# Patient Record
Sex: Female | Born: 1935
Health system: Southern US, Community
[De-identification: ages and names within clinical notes are randomized; demographics above are authoritative.]

## PROBLEM LIST (undated history)

## (undated) DIAGNOSIS — I10 Essential (primary) hypertension: Secondary | ICD-10-CM

## (undated) DIAGNOSIS — M1990 Unspecified osteoarthritis, unspecified site: Secondary | ICD-10-CM

## (undated) DIAGNOSIS — R011 Cardiac murmur, unspecified: Secondary | ICD-10-CM

## (undated) DIAGNOSIS — I480 Paroxysmal atrial fibrillation: Secondary | ICD-10-CM

## (undated) DIAGNOSIS — R001 Bradycardia, unspecified: Secondary | ICD-10-CM

## (undated) DIAGNOSIS — E039 Hypothyroidism, unspecified: Secondary | ICD-10-CM

## (undated) DIAGNOSIS — E785 Hyperlipidemia, unspecified: Secondary | ICD-10-CM

## (undated) DIAGNOSIS — Z9889 Other specified postprocedural states: Secondary | ICD-10-CM

## (undated) DIAGNOSIS — I639 Cerebral infarction, unspecified: Secondary | ICD-10-CM

## (undated) DIAGNOSIS — F419 Anxiety disorder, unspecified: Secondary | ICD-10-CM

## (undated) DIAGNOSIS — Z973 Presence of spectacles and contact lenses: Secondary | ICD-10-CM

## (undated) DIAGNOSIS — I5032 Chronic diastolic (congestive) heart failure: Secondary | ICD-10-CM

## (undated) DIAGNOSIS — R112 Nausea with vomiting, unspecified: Secondary | ICD-10-CM

## (undated) DIAGNOSIS — C50919 Malignant neoplasm of unspecified site of unspecified female breast: Secondary | ICD-10-CM

## (undated) HISTORY — DX: Hyperlipidemia, unspecified: E78.5

## (undated) HISTORY — DX: Cerebral infarction, unspecified: I63.9

## (undated) HISTORY — DX: Cardiac murmur, unspecified: R01.1

## (undated) HISTORY — PX: FRACTURE SURGERY: SHX138

## (undated) HISTORY — PX: EYE SURGERY: SHX253

## (undated) HISTORY — PX: BREAST SURGERY: SHX581

## (undated) HISTORY — PX: ABDOMINAL HYSTERECTOMY: SHX81

## (undated) HISTORY — PX: COLONOSCOPY: SHX174

---

## 2012-03-24 ENCOUNTER — Encounter (HOSPITAL_BASED_OUTPATIENT_CLINIC_OR_DEPARTMENT_OTHER): Payer: Self-pay | Admitting: *Deleted

## 2012-03-24 NOTE — Progress Notes (Signed)
Had ekg and labs with dr gage-will call for notes- Diabetic-bs 175-300 range this week Bring all meds and overnight bag Denies any cardiac problems Denies copd-smoked for many yr

## 2012-03-25 ENCOUNTER — Encounter (HOSPITAL_COMMUNITY): Payer: Self-pay | Admitting: *Deleted

## 2012-03-25 MED ORDER — CEFAZOLIN SODIUM-DEXTROSE 2-3 GM-% IV SOLR
2.0000 g | INTRAVENOUS | Status: AC
  Administered 2012-03-26: 2 g via INTRAVENOUS
  Filled 2012-03-25: qty 50

## 2012-03-26 ENCOUNTER — Encounter (HOSPITAL_COMMUNITY)
Admission: RE | Disposition: A | Discharge status: Skilled Nursing Facility | Payer: Self-pay | Source: Ambulatory Visit | Attending: Orthopedic Surgery

## 2012-03-26 ENCOUNTER — Ambulatory Visit (HOSPITAL_COMMUNITY): Payer: Medicare Other

## 2012-03-26 ENCOUNTER — Encounter (HOSPITAL_COMMUNITY): Payer: Self-pay | Admitting: Certified Registered Nurse Anesthetist

## 2012-03-26 ENCOUNTER — Ambulatory Visit (HOSPITAL_BASED_OUTPATIENT_CLINIC_OR_DEPARTMENT_OTHER): Admission: RE | Admit: 2012-03-26 | Payer: Medicare Other | Source: Ambulatory Visit | Admitting: Orthopedic Surgery

## 2012-03-26 ENCOUNTER — Inpatient Hospital Stay (HOSPITAL_COMMUNITY)
Admission: RE | Admit: 2012-03-26 | Discharge: 2012-03-30 | DRG: 494 | Disposition: A | Discharge status: Skilled Nursing Facility | Payer: Medicare Other | Source: Ambulatory Visit | Attending: Orthopedic Surgery | Admitting: Orthopedic Surgery

## 2012-03-26 ENCOUNTER — Encounter (HOSPITAL_BASED_OUTPATIENT_CLINIC_OR_DEPARTMENT_OTHER): Admission: RE | Payer: Self-pay | Source: Ambulatory Visit

## 2012-03-26 ENCOUNTER — Ambulatory Visit (HOSPITAL_COMMUNITY): Payer: Medicare Other | Admitting: Certified Registered Nurse Anesthetist

## 2012-03-26 DIAGNOSIS — Z7982 Long term (current) use of aspirin: Secondary | ICD-10-CM

## 2012-03-26 DIAGNOSIS — S82851A Displaced trimalleolar fracture of right lower leg, initial encounter for closed fracture: Secondary | ICD-10-CM

## 2012-03-26 DIAGNOSIS — X58XXXA Exposure to other specified factors, initial encounter: Secondary | ICD-10-CM | POA: Diagnosis present

## 2012-03-26 DIAGNOSIS — E119 Type 2 diabetes mellitus without complications: Secondary | ICD-10-CM | POA: Diagnosis present

## 2012-03-26 DIAGNOSIS — I10 Essential (primary) hypertension: Secondary | ICD-10-CM | POA: Diagnosis present

## 2012-03-26 DIAGNOSIS — Z794 Long term (current) use of insulin: Secondary | ICD-10-CM

## 2012-03-26 DIAGNOSIS — S82853A Displaced trimalleolar fracture of unspecified lower leg, initial encounter for closed fracture: Principal | ICD-10-CM | POA: Diagnosis present

## 2012-03-26 DIAGNOSIS — Z87891 Personal history of nicotine dependence: Secondary | ICD-10-CM

## 2012-03-26 HISTORY — DX: Other specified postprocedural states: Z98.890

## 2012-03-26 HISTORY — DX: Anxiety disorder, unspecified: F41.9

## 2012-03-26 HISTORY — DX: Hypothyroidism, unspecified: E03.9

## 2012-03-26 HISTORY — PX: ORIF ANKLE FRACTURE: SHX5408

## 2012-03-26 HISTORY — DX: Essential (primary) hypertension: I10

## 2012-03-26 HISTORY — DX: Unspecified osteoarthritis, unspecified site: M19.90

## 2012-03-26 HISTORY — DX: Presence of spectacles and contact lenses: Z97.3

## 2012-03-26 HISTORY — DX: Nausea with vomiting, unspecified: R11.2

## 2012-03-26 LAB — CBC
HCT: 41.7 % (ref 36.0–46.0)
MCHC: 34.8 g/dL (ref 30.0–36.0)
RDW: 12.9 % (ref 11.5–15.5)
WBC: 10.6 10*3/uL — ABNORMAL HIGH (ref 4.0–10.5)

## 2012-03-26 LAB — BASIC METABOLIC PANEL
Chloride: 98 mEq/L (ref 96–112)
Creatinine, Ser: 0.76 mg/dL (ref 0.50–1.10)
GFR calc Af Amer: >90 mL/min (ref >90)
GFR calc non Af Amer: 79 mL/min — ABNORMAL LOW (ref >90)
Potassium: 5.1 mEq/L (ref 3.5–5.1)

## 2012-03-26 LAB — GLUCOSE, CAPILLARY
Glucose-Capillary: 333 mg/dL — ABNORMAL HIGH (ref 70–99)
Glucose-Capillary: 222 mg/dL — ABNORMAL HIGH (ref 70–99)

## 2012-03-26 LAB — SURGICAL PCR SCREEN
MRSA, PCR: NEGATIVE
Staphylococcus aureus: NEGATIVE

## 2012-03-26 SURGERY — OPEN REDUCTION INTERNAL FIXATION (ORIF) ANKLE FRACTURE
Anesthesia: General | Site: Ankle | Laterality: Right | Wound class: Clean

## 2012-03-26 SURGERY — ANKLE ARTHROSCOPY WITH OPEN REDUCTION INTERNAL FIXATION (ORIF)
Anesthesia: General | Laterality: Right

## 2012-03-26 MED ORDER — ALPRAZOLAM 0.25 MG PO TABS
0.2500 mg | ORAL_TABLET | Freq: Every evening | ORAL | Status: DC | PRN
  Administered 2012-03-26 – 2012-03-29 (×3): 0.25 mg via ORAL
  Filled 2012-03-26 (×3): qty 1

## 2012-03-26 MED ORDER — HYDROCHLOROTHIAZIDE 25 MG PO TABS
12.5000 mg | ORAL_TABLET | Freq: Every day | ORAL | Status: DC
  Filled 2012-03-26: qty 0.5

## 2012-03-26 MED ORDER — ONDANSETRON HCL 4 MG PO TABS: 4.0000 mg | ORAL_TABLET | Freq: Four times a day (QID) | ORAL | Status: DC | PRN

## 2012-03-26 MED ORDER — INSULIN ASPART 100 UNIT/ML ~~LOC~~ SOLN
0.0000 [IU] | Freq: Three times a day (TID) | SUBCUTANEOUS | Status: DC
  Administered 2012-03-26: 11 [IU] via SUBCUTANEOUS
  Administered 2012-03-26 – 2012-03-27 (×2): 8 [IU] via SUBCUTANEOUS
  Administered 2012-03-27 (×2): 3 [IU] via SUBCUTANEOUS
  Administered 2012-03-28 – 2012-03-29 (×2): 8 [IU] via SUBCUTANEOUS
  Administered 2012-03-29: 15 [IU] via SUBCUTANEOUS
  Administered 2012-03-30: 5 [IU] via SUBCUTANEOUS

## 2012-03-26 MED ORDER — HYDROMORPHONE HCL PF 1 MG/ML IJ SOLN: 0.2500 mg | INTRAMUSCULAR | Status: DC | PRN

## 2012-03-26 MED ORDER — HYDROCHLOROTHIAZIDE 12.5 MG PO CAPS
12.5000 mg | ORAL_CAPSULE | Freq: Every day | ORAL | Status: DC
  Administered 2012-03-26 – 2012-03-30 (×5): 12.5 mg via ORAL
  Filled 2012-03-26 (×5): qty 1

## 2012-03-26 MED ORDER — LIDOCAINE-EPINEPHRINE (PF) 1.5 %-1:200000 IJ SOLN
INTRAMUSCULAR | Status: DC | PRN
  Administered 2012-03-26: 30 mL

## 2012-03-26 MED ORDER — MIDAZOLAM HCL 2 MG/2ML IJ SOLN
1.0000 mg | INTRAMUSCULAR | Status: DC | PRN
  Administered 2012-03-26: 2 mg via INTRAVENOUS

## 2012-03-26 MED ORDER — CITALOPRAM HYDROBROMIDE 20 MG PO TABS
20.0000 mg | ORAL_TABLET | Freq: Every day | ORAL | Status: DC
  Administered 2012-03-27 – 2012-03-30 (×4): 20 mg via ORAL
  Filled 2012-03-26 (×4): qty 1

## 2012-03-26 MED ORDER — ASPIRIN EC 325 MG PO TBEC
325.0000 mg | DELAYED_RELEASE_TABLET | Freq: Two times a day (BID) | ORAL | Status: DC
  Administered 2012-03-26 – 2012-03-30 (×9): 325 mg via ORAL
  Filled 2012-03-26 (×10): qty 1

## 2012-03-26 MED ORDER — MIDAZOLAM HCL 5 MG/5ML IJ SOLN
INTRAMUSCULAR | Status: DC | PRN
  Administered 2012-03-26: 2 mg via INTRAVENOUS

## 2012-03-26 MED ORDER — SENNA 8.6 MG PO TABS
2.0000 | ORAL_TABLET | Freq: Two times a day (BID) | ORAL | Status: DC
  Administered 2012-03-26 – 2012-03-30 (×6): 17.2 mg via ORAL
  Filled 2012-03-26 (×3): qty 2
  Filled 2012-03-26: qty 1
  Filled 2012-03-26 (×6): qty 2

## 2012-03-26 MED ORDER — ATORVASTATIN CALCIUM 40 MG PO TABS
40.0000 mg | ORAL_TABLET | Freq: Every day | ORAL | Status: DC
  Administered 2012-03-26 – 2012-03-28 (×3): 40 mg via ORAL
  Filled 2012-03-26 (×5): qty 1

## 2012-03-26 MED ORDER — BACITRACIN ZINC 500 UNIT/GM EX OINT
TOPICAL_OINTMENT | CUTANEOUS | Status: DC | PRN
  Administered 2012-03-26: 1 via TOPICAL

## 2012-03-26 MED ORDER — FENTANYL CITRATE 0.05 MG/ML IJ SOLN
INTRAMUSCULAR | Status: AC
  Filled 2012-03-26: qty 2

## 2012-03-26 MED ORDER — FENTANYL CITRATE 0.05 MG/ML IJ SOLN
50.0000 ug | INTRAMUSCULAR | Status: DC | PRN
  Administered 2012-03-26: 50 ug via INTRAVENOUS

## 2012-03-26 MED ORDER — LEVOTHYROXINE SODIUM 50 MCG PO TABS
50.0000 ug | ORAL_TABLET | Freq: Every day | ORAL | Status: DC
  Administered 2012-03-27 – 2012-03-30 (×5): 50 ug via ORAL
  Filled 2012-03-26 (×5): qty 1

## 2012-03-26 MED ORDER — LACTATED RINGERS IV SOLN
INTRAVENOUS | Status: DC | PRN
  Administered 2012-03-26 (×2): via INTRAVENOUS

## 2012-03-26 MED ORDER — IRBESARTAN 300 MG PO TABS
300.0000 mg | ORAL_TABLET | Freq: Every day | ORAL | Status: DC
  Administered 2012-03-26 – 2012-03-30 (×5): 300 mg via ORAL
  Filled 2012-03-26 (×5): qty 1

## 2012-03-26 MED ORDER — FENTANYL CITRATE 0.05 MG/ML IJ SOLN
INTRAMUSCULAR | Status: DC | PRN
  Administered 2012-03-26: 50 ug via INTRAVENOUS

## 2012-03-26 MED ORDER — BUPIVACAINE-EPINEPHRINE PF 0.5-1:200000 % IJ SOLN
INTRAMUSCULAR | Status: DC | PRN
  Administered 2012-03-26: 30 mL

## 2012-03-26 MED ORDER — ONDANSETRON HCL 4 MG/2ML IJ SOLN: 4.0000 mg | Freq: Once | INTRAMUSCULAR | Status: DC | PRN

## 2012-03-26 MED ORDER — POTASSIUM CHLORIDE CRYS ER 20 MEQ PO TBCR
20.0000 meq | EXTENDED_RELEASE_TABLET | Freq: Every day | ORAL | Status: DC
  Administered 2012-03-26 – 2012-03-30 (×5): 20 meq via ORAL
  Filled 2012-03-26 (×5): qty 1

## 2012-03-26 MED ORDER — PHENYLEPHRINE HCL 10 MG/ML IJ SOLN
INTRAMUSCULAR | Status: DC | PRN
  Administered 2012-03-26: 80 ug via INTRAVENOUS
  Administered 2012-03-26: 40 ug via INTRAVENOUS
  Administered 2012-03-26: 80 ug via INTRAVENOUS

## 2012-03-26 MED ORDER — OXYCODONE HCL 5 MG PO TABS
5.0000 mg | ORAL_TABLET | ORAL | Status: DC | PRN
  Administered 2012-03-26 – 2012-03-29 (×10): 5 mg via ORAL
  Filled 2012-03-26 (×10): qty 1

## 2012-03-26 MED ORDER — AMLODIPINE-OLMESARTAN 10-40 MG PO TABS: 1.0000 | ORAL_TABLET | Freq: Every day | ORAL | Status: DC

## 2012-03-26 MED ORDER — VITAMIN B-12 1000 MCG PO TABS
1000.0000 ug | ORAL_TABLET | Freq: Every day | ORAL | Status: DC
  Administered 2012-03-26 – 2012-03-30 (×5): 1000 ug via ORAL
  Filled 2012-03-26 (×5): qty 1

## 2012-03-26 MED ORDER — AMLODIPINE BESYLATE 10 MG PO TABS
10.0000 mg | ORAL_TABLET | Freq: Every day | ORAL | Status: DC
  Administered 2012-03-26 – 2012-03-30 (×5): 10 mg via ORAL
  Filled 2012-03-26 (×5): qty 1

## 2012-03-26 MED ORDER — METOCLOPRAMIDE HCL 5 MG/ML IJ SOLN
5.0000 mg | Freq: Three times a day (TID) | INTRAMUSCULAR | Status: DC | PRN
  Filled 2012-03-26: qty 2

## 2012-03-26 MED ORDER — METOPROLOL SUCCINATE ER 25 MG PO TB24
25.0000 mg | ORAL_TABLET | Freq: Every day | ORAL | Status: DC
  Administered 2012-03-27 – 2012-03-30 (×4): 25 mg via ORAL
  Filled 2012-03-26 (×4): qty 1

## 2012-03-26 MED ORDER — METOCLOPRAMIDE HCL 10 MG PO TABS: 5.0000 mg | ORAL_TABLET | Freq: Three times a day (TID) | ORAL | Status: DC | PRN

## 2012-03-26 MED ORDER — CHLORHEXIDINE GLUCONATE 4 % EX LIQD: 60.0000 mL | Freq: Once | CUTANEOUS | Status: DC

## 2012-03-26 MED ORDER — BACITRACIN ZINC 500 UNIT/GM EX OINT
TOPICAL_OINTMENT | CUTANEOUS | Status: AC
  Filled 2012-03-26: qty 15

## 2012-03-26 MED ORDER — ONDANSETRON HCL 4 MG/2ML IJ SOLN: 4.0000 mg | Freq: Four times a day (QID) | INTRAMUSCULAR | Status: DC | PRN

## 2012-03-26 MED ORDER — EPHEDRINE SULFATE 50 MG/ML IJ SOLN
INTRAMUSCULAR | Status: DC | PRN
  Administered 2012-03-26 (×2): 10 mg via INTRAVENOUS
  Administered 2012-03-26: 5 mg via INTRAVENOUS

## 2012-03-26 MED ORDER — SODIUM CHLORIDE 0.9 % IV SOLN: INTRAVENOUS | Status: DC

## 2012-03-26 MED ORDER — MUPIROCIN 2 % EX OINT
TOPICAL_OINTMENT | CUTANEOUS | Status: AC
  Administered 2012-03-26: 1 via NASAL
  Filled 2012-03-26: qty 22

## 2012-03-26 MED ORDER — DOCUSATE SODIUM 100 MG PO CAPS
100.0000 mg | ORAL_CAPSULE | Freq: Two times a day (BID) | ORAL | Status: DC
  Administered 2012-03-26 – 2012-03-30 (×5): 100 mg via ORAL
  Filled 2012-03-26 (×5): qty 1

## 2012-03-26 MED ORDER — PROPOFOL 10 MG/ML IV EMUL
INTRAVENOUS | Status: DC | PRN
  Administered 2012-03-26: 150 mg via INTRAVENOUS

## 2012-03-26 MED ORDER — ONDANSETRON HCL 4 MG/2ML IJ SOLN
INTRAMUSCULAR | Status: DC | PRN
  Administered 2012-03-26: 4 mg via INTRAVENOUS

## 2012-03-26 MED ORDER — INSULIN DETEMIR 100 UNIT/ML ~~LOC~~ SOLN
20.0000 [IU] | Freq: Every day | SUBCUTANEOUS | Status: DC
  Administered 2012-03-26 – 2012-03-29 (×4): 20 [IU] via SUBCUTANEOUS
  Filled 2012-03-26 (×2): qty 10

## 2012-03-26 MED ORDER — SODIUM CHLORIDE 0.9 % IV SOLN
INTRAVENOUS | Status: DC
  Administered 2012-03-26: 16:00:00 via INTRAVENOUS

## 2012-03-26 MED ORDER — MORPHINE SULFATE 2 MG/ML IJ SOLN
1.0000 mg | INTRAMUSCULAR | Status: DC | PRN
  Administered 2012-03-26: 1 mg via INTRAVENOUS
  Filled 2012-03-26: qty 1

## 2012-03-26 MED ORDER — INSULIN ASPART 100 UNIT/ML ~~LOC~~ SOLN
SUBCUTANEOUS | Status: AC
  Filled 2012-03-26: qty 1

## 2012-03-26 MED ORDER — MIDAZOLAM HCL 2 MG/2ML IJ SOLN
INTRAMUSCULAR | Status: AC
  Filled 2012-03-26: qty 2

## 2012-03-26 MED ORDER — 0.9 % SODIUM CHLORIDE (POUR BTL) OPTIME
TOPICAL | Status: DC | PRN
  Administered 2012-03-26: 1000 mL

## 2012-03-26 MED ORDER — LIDOCAINE HCL (CARDIAC) 20 MG/ML IV SOLN
INTRAVENOUS | Status: DC | PRN
  Administered 2012-03-26: 100 mg via INTRAVENOUS

## 2012-03-26 SURGICAL SUPPLY — 73 items
BANDAGE ESMARK 6X9 LF (GAUZE/BANDAGES/DRESSINGS) ×1 IMPLANT
BIT DRILL 2.5X2.75 QC CALB (BIT) ×2 IMPLANT
BIT DRILL CALIBRATED 2.7 (BIT) ×2 IMPLANT
BLADE SURG 15 STRL LF DISP TIS (BLADE) ×1 IMPLANT
BLADE SURG 15 STRL SS (BLADE) ×1
BNDG COHESIVE 4X5 TAN STRL (GAUZE/BANDAGES/DRESSINGS) ×2 IMPLANT
BNDG COHESIVE 6X5 TAN STRL LF (GAUZE/BANDAGES/DRESSINGS) ×2 IMPLANT
BNDG ESMARK 6X9 LF (GAUZE/BANDAGES/DRESSINGS) ×2
CHLORAPREP W/TINT 26ML (MISCELLANEOUS) ×4 IMPLANT
CLOTH BEACON ORANGE TIMEOUT ST (SAFETY) ×2 IMPLANT
COVER SURGICAL LIGHT HANDLE (MISCELLANEOUS) ×2 IMPLANT
CUFF TOURNIQUET SINGLE 34IN LL (TOURNIQUET CUFF) ×2 IMPLANT
CUFF TOURNIQUET SINGLE 44IN (TOURNIQUET CUFF) IMPLANT
DRAPE C-ARM 42X72 X-RAY (DRAPES) ×2 IMPLANT
DRAPE C-ARMOR (DRAPES) ×2 IMPLANT
DRAPE INCISE IOBAN 66X45 STRL (DRAPES) IMPLANT
DRAPE U-SHAPE 47X51 STRL (DRAPES) ×2 IMPLANT
DRSG ADAPTIC 3X8 NADH LF (GAUZE/BANDAGES/DRESSINGS) ×2 IMPLANT
DRSG PAD ABDOMINAL 8X10 ST (GAUZE/BANDAGES/DRESSINGS) ×2 IMPLANT
ELECT REM PT RETURN 9FT ADLT (ELECTROSURGICAL) ×2
ELECTRODE REM PT RTRN 9FT ADLT (ELECTROSURGICAL) ×1 IMPLANT
GLOVE BIO SURGEON STRL SZ8 (GLOVE) ×2 IMPLANT
GLOVE BIOGEL PI IND STRL 6.5 (GLOVE) ×1 IMPLANT
GLOVE BIOGEL PI IND STRL 7.0 (GLOVE) ×1 IMPLANT
GLOVE BIOGEL PI IND STRL 8 (GLOVE) ×1 IMPLANT
GLOVE BIOGEL PI INDICATOR 6.5 (GLOVE) ×1
GLOVE BIOGEL PI INDICATOR 7.0 (GLOVE) ×1
GLOVE BIOGEL PI INDICATOR 8 (GLOVE) ×1
GLOVE SURG SS PI 6.0 STRL IVOR (GLOVE) ×2 IMPLANT
GLOVE SURG SS PI 7.5 STRL IVOR (GLOVE) ×2 IMPLANT
GOWN PREVENTION PLUS XLARGE (GOWN DISPOSABLE) ×4 IMPLANT
GOWN STRL NON-REIN LRG LVL3 (GOWN DISPOSABLE) ×4 IMPLANT
K-WIRE ACE 1.6X6 (WIRE) ×2
KIT BASIN OR (CUSTOM PROCEDURE TRAY) ×2 IMPLANT
KIT ROOM TURNOVER OR (KITS) ×2 IMPLANT
KWIRE ACE 1.6X6 (WIRE) ×1 IMPLANT
MANIFOLD NEPTUNE II (INSTRUMENTS) IMPLANT
NEEDLE 22X1 1/2 (OR ONLY) (NEEDLE) IMPLANT
NS IRRIG 1000ML POUR BTL (IV SOLUTION) ×2 IMPLANT
PACK ORTHO EXTREMITY (CUSTOM PROCEDURE TRAY) ×2 IMPLANT
PAD ARMBOARD 7.5X6 YLW CONV (MISCELLANEOUS) ×4 IMPLANT
PAD CAST 4YDX4 CTTN HI CHSV (CAST SUPPLIES) ×1 IMPLANT
PADDING CAST ABS 4INX4YD NS (CAST SUPPLIES) ×1
PADDING CAST ABS 6INX4YD NS (CAST SUPPLIES) ×1
PADDING CAST ABS COTTON 4X4 ST (CAST SUPPLIES) ×1 IMPLANT
PADDING CAST ABS COTTON 6X4 NS (CAST SUPPLIES) ×1 IMPLANT
PADDING CAST COTTON 4X4 STRL (CAST SUPPLIES) ×1
PLATE LOCK 8H 103 BILAT FIB (Plate) ×2 IMPLANT
SCREW ACE CAN 4.0 42M (Screw) ×2 IMPLANT
SCREW ACE CAN 4.0 44M (Screw) ×2 IMPLANT
SCREW CORTICAL 3.5MM  16MM (Screw) ×1 IMPLANT
SCREW CORTICAL 3.5MM 14MM (Screw) ×2 IMPLANT
SCREW CORTICAL 3.5MM 16MM (Screw) ×1 IMPLANT
SCREW LOCK CORT STAR 3.5X10 (Screw) ×4 IMPLANT
SCREW LOCK CORT STAR 3.5X12 (Screw) ×2 IMPLANT
SCREW LOCK CORT STAR 3.5X14 (Screw) ×4 IMPLANT
SPLINT PLASTER CAST XFAST 5X30 (CAST SUPPLIES) ×2 IMPLANT
SPLINT PLASTER XFAST SET 5X30 (CAST SUPPLIES) ×2
SPONGE GAUZE 4X4 12PLY (GAUZE/BANDAGES/DRESSINGS) ×2 IMPLANT
SPONGE LAP 18X18 X RAY DECT (DISPOSABLE) ×2 IMPLANT
STAPLER VISISTAT 35W (STAPLE) IMPLANT
SUCTION FRAZIER TIP 10 FR DISP (SUCTIONS) ×2 IMPLANT
SUT MNCRL AB 3-0 PS2 18 (SUTURE) IMPLANT
SUT PROLENE 3 0 PS 2 (SUTURE) ×2 IMPLANT
SUT VIC AB 2-0 CT1 27 (SUTURE) ×1
SUT VIC AB 2-0 CT1 TAPERPNT 27 (SUTURE) ×1 IMPLANT
SUT VIC AB 3-0 PS2 18 (SUTURE) ×1
SUT VIC AB 3-0 PS2 18XBRD (SUTURE) ×1 IMPLANT
SYR CONTROL 10ML LL (SYRINGE) IMPLANT
TOWEL OR 17X24 6PK STRL BLUE (TOWEL DISPOSABLE) ×2 IMPLANT
TOWEL OR 17X26 10 PK STRL BLUE (TOWEL DISPOSABLE) ×2 IMPLANT
TUBE CONNECTING 12X1/4 (SUCTIONS) ×2 IMPLANT
WATER STERILE IRR 1000ML POUR (IV SOLUTION) ×2 IMPLANT

## 2012-03-26 NOTE — Addendum Note (Signed)
Addendum  created 03/26/12 1141 by Veatrice Bourbon, MD   Modules edited:Anesthesia Blocks and Procedures, Anesthesia Medication Administration, Inpatient Notes

## 2012-03-26 NOTE — Progress Notes (Signed)
Adm nurse notified of needed adm h/p completion

## 2012-03-26 NOTE — Anesthesia Preprocedure Evaluation (Addendum)
Anesthesia Evaluation  Patient identified by MRN, date of birth, ID band Patient awake    Reviewed: Allergy & Precautions, H&P , NPO status , Patient's Chart, lab work & pertinent test results  Airway Mallampati: I TM Distance: >3 FB Neck ROM: Full    Dental  (+) Teeth Intact and Dental Advisory Given   Pulmonary          Cardiovascular hypertension, Pt. on medications     Neuro/Psych    GI/Hepatic   Endo/Other  Well Controlled, Type 2, Oral Hypoglycemic Agents  Renal/GU      Musculoskeletal   Abdominal   Peds  Hematology   Anesthesia Other Findings   Reproductive/Obstetrics                          Anesthesia Physical Anesthesia Plan  ASA: II  Anesthesia Plan: General   Post-op Pain Management: MAC Combined w/ Regional for Post-op pain   Induction: Intravenous  Airway Management Planned: LMA  Additional Equipment:   Intra-op Plan:   Post-operative Plan: Extubation in OR  Informed Consent: I have reviewed the patients History and Physical, chart, labs and discussed the procedure including the risks, benefits and alternatives for the proposed anesthesia with the patient or authorized representative who has indicated his/her understanding and acceptance.     Plan Discussed with: CRNA and Surgeon  Anesthesia Plan Comments:         Anesthesia Quick Evaluation

## 2012-03-26 NOTE — Transfer of Care (Signed)
Immediate Anesthesia Transfer of Care Note  Patient: Amber Ballard  Procedure(s) Performed: Procedure(s) (LRB): OPEN REDUCTION INTERNAL FIXATION (ORIF) ANKLE FRACTURE (Right)  Patient Location: PACU  Anesthesia Type: General  Level of Consciousness: sedated  Airway & Oxygen Therapy: Patient Spontanous Breathing and Patient connected to nasal cannula oxygen  Post-op Assessment: Report given to PACU RN and Post -op Vital signs reviewed and stable  Post vital signs: Reviewed and stable  Complications: No apparent anesthesia complications

## 2012-03-26 NOTE — Brief Op Note (Signed)
03/26/2012  11:14 AM  PATIENT:  Amber Ballard  78 y.o. female  PRE-OPERATIVE DIAGNOSIS:  right ankle trimalleolar fracture  POST-OPERATIVE DIAGNOSIS:  right ankle trimalleolar fracture  Procedure(s): 1.  ORIF right ankle trimalleolar fracture with fixation of posterior malleolus 2.  Fluoro  SURGEON:  Wylene Simmer, MD  ASSISTANT: n/a  ANESTHESIA:   General, regional  EBL:  minimal   TOURNIQUET:   Total Tourniquet Time Documented: Thigh (Right) - 56 minutes  COMPLICATIONS:  None apparent  DISPOSITION:  Extubated, awake and stable to recovery.  DICTATION ID:  [id number]

## 2012-03-26 NOTE — Anesthesia Procedure Notes (Signed)
Anesthesia Regional Block:  Popliteal block  Pre-Anesthetic Checklist: ,, timeout performed, Correct Patient, Correct Site, Correct Laterality, Correct Procedure, Correct Position, site marked, Risks and benefits discussed,  Surgical consent,  Pre-op evaluation,  At surgeon's request and post-op pain management  Laterality: Right  Prep: chloraprep       Needles:  Injection technique: Single-shot  Needle Type: Echogenic Stimulator Needle     Needle Length:cm 9 cm Needle Gauge: 21 G    Additional Needles:  Procedures: ultrasound guided and nerve stimulator Popliteal block  Nerve Stimulator or Paresthesia:  Response: 0.4 mA,   Additional Responses:   Narrative:  Start time: 03/26/2012 8:55 AM End time: 03/26/2012 9:15 AM Injection made incrementally with aspirations every 5 mL. Anesthesiologist: Lillia Abed MD  Additional Notes: Monitors applied. Patient sedated. Sterile prep and drape,hand hygiene and sterile gloves were used. Relevant anatomy identified.Needle position confirmed.Local anesthetic injected incrementally after negative aspiration. Local anesthetic spread visualized around nerve(s). Vascular puncture avoided. No complications. Image printed for medical record.The patient tolerated the procedure well.  Additional Saphenous nerve block performed. 15cc Local Anesthetic mixture placed under ultrasonic guidance along the medio-inferior border of the Sartorious muscle 6 inches above the knee.  No Problems encountered.  Lillia Abed MD   Popliteal block

## 2012-03-26 NOTE — Anesthesia Postprocedure Evaluation (Signed)
Anesthesia Post Note  Patient: Amber Ballard  Procedure(s) Performed: Procedure(s) (LRB): OPEN REDUCTION INTERNAL FIXATION (ORIF) ANKLE FRACTURE (Right)  Anesthesia type: general  Patient location: PACU  Post pain: Pain level controlled  Post assessment: Patient's Cardiovascular Status Stable  Last Vitals:  Filed Vitals:   03/26/12 1130  BP: 133/38  Pulse: 88  Temp:   Resp: 17    Post vital signs: Reviewed and stable  Level of consciousness: sedated  Complications: No apparent anesthesia complications

## 2012-03-26 NOTE — H&P (Signed)
Amber Ballard is an 78 y.o. female.   Chief Complaint: R ankle injury HPI: 78 y/o female with PMH as below presents now for ORIF of right ankle trimal fracture.  Past Medical History  Diagnosis Date  . PONV (postoperative nausea and vomiting)   . Hypertension   . Diabetes mellitus   . Hypothyroidism   . Anxiety   . Wears glasses   . Arthritis     Past Surgical History  Procedure Date  . Abdominal hysterectomy   . Breast surgery     lt lumpectomy-snbx  . Eye surgery     both cataracts  . Fracture surgery     rt arm,rt fingers  . Colonoscopy     History reviewed. No pertinent family history. Social History:  reports that she quit smoking about 2 years ago. She does not have any smokeless tobacco history on file. She reports that she does not drink alcohol or use illicit drugs.  Allergies: No Known Allergies  Medications Prior to Admission  Medication Sig Dispense Refill  . ALPRAZolam (XANAX) 0.25 MG tablet Take 0.25 mg by mouth at bedtime as needed. For sleep      . amLODipine-olmesartan (AZOR) 10-40 MG per tablet Take 1 tablet by mouth daily.      Marland Kitchen aspirin 325 MG EC tablet Take 325 mg by mouth daily.      . Calcium Polycarbophil (FIBER LAXATIVE PO) Take 1 tablet by mouth daily.      . citalopram (CELEXA) 20 MG tablet Take 20 mg by mouth daily.      . hydrochlorothiazide (HYDRODIURIL) 25 MG tablet Take 12.5 mg by mouth daily.      . insulin aspart (NOVOLOG) 100 UNIT/ML injection Inject 5-10 Units into the skin 3 (three) times daily before meals. Sliding scale      . insulin detemir (LEVEMIR) 100 UNIT/ML injection Inject 20 Units into the skin at bedtime.      Marland Kitchen levothyroxine (SYNTHROID, LEVOTHROID) 50 MCG tablet Take 50 mcg by mouth daily.      . metoprolol succinate (TOPROL-XL) 25 MG 24 hr tablet Take 25 mg by mouth daily.      . potassium chloride SA (K-DUR,KLOR-CON) 20 MEQ tablet Take 20 mEq by mouth daily.      . promethazine (PHENERGAN) 25 MG tablet Take 25 mg by mouth  every 6 (six) hours as needed. For nausea      . rosuvastatin (CRESTOR) 20 MG tablet Take 20 mg by mouth daily.      . traMADol (ULTRAM) 50 MG tablet Take 50 mg by mouth every 6 (six) hours as needed. For pain      . vitamin B-12 (CYANOCOBALAMIN) 1000 MCG tablet Take 1,000 mcg by mouth daily.        Results for orders placed during the hospital encounter of 03/26/12 (from the past 48 hour(s))  BASIC METABOLIC PANEL     Status: Abnormal   Collection Time   03/26/12  7:42 AM      Component Value Range Comment   Sodium 137  135 - 145 mEq/L    Potassium 5.1  3.5 - 5.1 mEq/L HEMOLYSIS AT THIS LEVEL MAY AFFECT RESULT   Chloride 98  96 - 112 mEq/L    CO2 26  19 - 32 mEq/L    Glucose, Bld 217 (*) 70 - 99 mg/dL    BUN 14  6 - 23 mg/dL    Creatinine, Ser 0.76  0.50 - 1.10 mg/dL  Calcium 9.5  8.4 - 10.5 mg/dL    GFR calc non Af Amer 79 (*) >90 mL/min    GFR calc Af Amer >90  >90 mL/min   CBC     Status: Abnormal   Collection Time   04/22/12  7:42 AM      Component Value Range Comment   WBC 10.6 (*) 4.0 - 10.5 K/uL    RBC 4.56  3.87 - 5.11 MIL/uL    Hemoglobin 14.5  12.0 - 15.0 g/dL    HCT 41.7  36.0 - 46.0 %    MCV 91.4  78.0 - 100.0 fL    MCH 31.8  26.0 - 34.0 pg    MCHC 34.8  30.0 - 36.0 g/dL    RDW 12.9  11.5 - 15.5 %    Platelets 325  150 - 400 K/uL    Dg Chest 2 View  2012-04-22  *RADIOLOGY REPORT*  Clinical Data: Preop radiograph.  CHEST - 2 VIEW  Comparison: 03/10/2005  Findings: The heart size and mediastinal contours are within normal limits.  Both lungs are clear.  The visualized skeletal structures are unremarkable.  IMPRESSION: Negative exam.   Original Report Authenticated By: Angelita Ingles, M.D.     ROS  No recent f/cn/v/ wt loss  Blood pressure 152/76, pulse 62, temperature 97.5 F (36.4 C), temperature source Oral, resp. rate 15, height 5\' 2"  (1.575 m), weight 67.132 kg (148 lb), SpO2 99.00%. Physical Exam  Elderly female in nad.  A and O x 4.  Mood and affect  normal.  EOMI.  Respirations unlabored.  R ankle splinted.  SKin intact at toes.  Brisk cap refill at toes.   Assessment/Plan R ankle fracture - to OR for ORIF.  The risks and benefits of the alternative treatment options have been discussed in detail.  The patient wishes to proceed with surgery and specifically understands risks of bleeding, infection, nerve damage, blood clots, need for additional surgery, amputation and death.   Wylene Simmer 04/22/2012, 9:37 AM

## 2012-03-27 ENCOUNTER — Encounter (HOSPITAL_COMMUNITY): Payer: Self-pay | Admitting: Orthopedic Surgery

## 2012-03-27 LAB — GLUCOSE, CAPILLARY: Glucose-Capillary: 275 mg/dL — ABNORMAL HIGH (ref 70–99)

## 2012-03-27 NOTE — Progress Notes (Addendum)
Clinical Social Work Department CLINICAL SOCIAL WORK PLACEMENT NOTE 03/27/2012  Patient:  Amber Ballard, Amber Ballard  Account Number:  [id number] Admit date:  03/26/2012  Clinical Social Worker:  Sindy Messing, LCSW  Date/time:  03/27/2012 02:20 PM  Clinical Social Work is seeking post-discharge placement for this patient at the following level of care:   SKILLED NURSING   (*CSW will update this form in Epic as items are completed)   03/27/2012  Patient/family provided with [facility] Department of Clinical Social Work's list of facilities offering this level of care within the geographic area requested by the patient (or if unable, by the patient's family).  03/27/2012  Patient/family informed of their freedom to choose among providers that offer the needed level of care, that participate in Medicare, Medicaid or managed care program needed by the patient, have an available bed and are willing to accept the patient.  03/27/2012  Patient/family informed of [facility]' ownership interest in [facility], as well as of the fact that they are under no obligation to receive care at this facility.  PASARR submitted to EDS on 03/27/2012 PASARR number received from EDS on 03/27/2012  FL2 transmitted to all facilities in geographic area requested by pt/family on  03/27/2012 FL2 transmitted to all facilities within larger geographic area on   Patient informed that his/her managed care company has contracts with or will negotiate with  certain facilities, including the following:     Patient/family informed of bed offers received:  03/29/12 Patient chooses bed at [facility] Physician recommends and patient chooses bed at    Patient to be transferred to  [facility] on  03/30/12 Patient to be transferred to facility by dtr  The following physician request were entered in Epic:   Additional Comments:

## 2012-03-27 NOTE — Evaluation (Signed)
Physical Therapy Evaluation Patient Details Name: Amber Ballard MRN: [id number] DOB: [birth year] Today's Date: 03/27/2012 Time: [id number] PT Time Calculation (min): 22 min  PT Assessment / Plan / Recommendation Clinical Impression  Pt is 78 y/o female admitted for s/p right ankle ORIF.  Pt will benefit from acute PT services to improve overall mobility and prepare for safe d/c to next venue.  Spoke with daughter and pt's sister may not be able to provice 24 hour supervision.  Therefore highly recommend SNF for overall safety.    PT Assessment  Patient needs continued PT services    Follow Up Recommendations  Skilled nursing facility    Barriers to Discharge        Equipment Recommendations  Defer to next venue    Recommendations for Other Services     Frequency Min 5X/week    Precautions / Restrictions Precautions Precautions: Fall Restrictions Weight Bearing Restrictions: Yes RLE Weight Bearing: Non weight bearing   Pertinent Vitals/Pain 3/10 right LE pain      Mobility  Bed Mobility Bed Mobility: Supine to Sit Supine to Sit: 4: Min guard Details for Bed Mobility Assistance: Minguard for safety with cues for technique. Transfers Transfers: Sit to Stand;Stand to Sit Sit to Stand: 4: Min guard;From bed Stand to Sit: 4: Min guard;To chair/3-in-1 Details for Transfer Assistance: Minguard for safety with cues for hand placement Ambulation/Gait Ambulation/Gait Assistance: 4: Min assist Ambulation Distance (Feet): 30 Feet Assistive device: Rolling walker Ambulation/Gait Assistance Details: Intermittent min (A) due to LOB.  Pt having difficulty hopping on left LE at times and tends to shuffle.  Max cues to maintain NWB on RLE and cues for proper technique. Gait Pattern: Shuffle;Antalgic    Exercises     PT Diagnosis: Difficulty walking;Abnormality of gait;Generalized weakness;Acute pain  PT Problem List: Decreased strength;Decreased range of motion;Decreased activity  tolerance;Decreased balance;Decreased mobility;Decreased knowledge of use of DME;Pain PT Treatment Interventions: Gait training;Stair training;Functional mobility training;Therapeutic activities;Therapeutic exercise;Balance training   PT Goals Acute Rehab PT Goals PT Goal Formulation: With patient Time For Goal Achievement: 04/03/12 Potential to Achieve Goals: Good Pt will go Supine/Side to Sit: with modified independence PT Goal: Supine/Side to Sit - Progress: Goal set today Pt will go Sit to Supine/Side: with modified independence PT Goal: Sit to Supine/Side - Progress: Goal set today Pt will go Sit to Stand: with modified independence PT Goal: Sit to Stand - Progress: Goal set today Pt will go Stand to Sit: with modified independence PT Goal: Stand to Sit - Progress: Goal set today Pt will Ambulate: 16 - 50 feet;with modified independence;with rolling walker PT Goal: Ambulate - Progress: Goal set today  Visit Information  Last PT Received On: 03/27/12 Assistance Needed: +1    Subjective Data  Subjective: "I'm doing well today.: Patient Stated Goal: To eventually go home   Prior Functioning  Home Living Lives With: Other (Comment) (sister) Available Help at Discharge: Other (Comment) (sister only able to provide supervision) Type of Home: House Home Access: Stairs to enter [employer] of Steps: 1 Entrance Stairs-Rails: None Home Layout: One level Bathroom Shower/Tub: Walk-in shower;Door [employer] Toilet: Standard Bathroom Accessibility: Yes How Accessible: Accessible via walker Home Adaptive Equipment: Walker - rolling;Straight cane Prior Function Level of Independence: Independent Able to Take Stairs?: Yes Driving: Yes Vocation: Retired Corporate investment banker: No difficulties Dominant Hand: Right    Cognition  Overall Cognitive Status: Appears within functional limits for tasks assessed/performed Arousal/Alertness: Awake/alert Orientation Level:  Appears intact for tasks assessed Behavior During  Session: [facility] for tasks performed    Extremity/Trunk Assessment Right Upper Extremity Assessment RUE ROM/Strength/Tone: Within functional levels Left Upper Extremity Assessment LUE ROM/Strength/Tone: Within functional levels Right Lower Extremity Assessment RLE ROM/Strength/Tone: Deficits;Unable to fully assess;Due to pain Left Lower Extremity Assessment LLE ROM/Strength/Tone: Within functional levels   Balance    End of Session PT - End of Session Equipment Utilized During Treatment: Gait belt Activity Tolerance: Patient tolerated treatment well Patient left: in chair;with call bell/phone within reach Nurse Communication: Mobility status  GP     Travez Stancil 03/27/2012, 2:26 PM Antoine Poche, [place] DPT [phone]

## 2012-03-27 NOTE — Progress Notes (Signed)
Clinical Social Work Department BRIEF PSYCHOSOCIAL ASSESSMENT 03/27/2012  Patient:  Amber Ballard, Amber Ballard     Account Number:  [id number]     Admit date:  03/26/2012  Clinical Social Worker:  Earlie Server  Date/Time:  03/27/2012 02:20 PM  Referred by:  Physician  Date Referred:  03/27/2012 Referred for  SNF Placement   Other Referral:   Interview type:  Patient Other interview type:   Dtr in room    PSYCHOSOCIAL DATA Living Status:  FAMILY Admitted from facility:   Level of care:   Primary support name:  [name] Primary support relationship to patient:  CHILD, ADULT Degree of support available:   Strong    CURRENT CONCERNS Current Concerns  Post-Acute Placement   Other Concerns:    SOCIAL WORK ASSESSMENT / PLAN CSW received referral to assist with SNF placement. CSW reviewed chart and spoke with PT. CSW met with patient in room. Dtr present. Patient agreeable to dtr being involved in assessment.    CSW introduced myself and explained role. Patient reports that she currently lives with sister who is older than she is. Patient states that she had a fall at home PTA. Patient and dtr are agreeable to SNF placement. CSW provided patient with SNF list and explained process. Patient and dtr aware of copays with insurance for SNF. Patient prefers placement at [facility]. CSW explained that county wide search could be completed in case [facility] was unavailable. Patient and dtr agreeable to this plan.    CSW completed FL2 and faxed out information. CSW submitted for a level 1 pasarr. Patient reports she has some anxiety and takes medication PRN that is prescribed by her PCP. CSW will follow up with bed offers.   Assessment/plan status:  Psychosocial Support/Ongoing Assessment of Needs Other assessment/ plan:   Information/referral to community resources:   SNF list    PATIENT'S/FAMILY'S RESPONSE TO PLAN OF CARE: Patient was alert and oriented. Patient and dtr engaged throughout assessment.  Patient appreciative of CSW consult.

## 2012-03-27 NOTE — Progress Notes (Signed)
Subjective: 1 Day Post-Op Procedure(s) (LRB): OPEN REDUCTION INTERNAL FIXATION (ORIF) ANKLE FRACTURE (Right) Patient reports pain as moderate.    Objective: Vital signs in last 24 hours: Temp:  [97.6 F (36.4 C)-99.9 F (37.7 C)] 98.4 F (36.9 C) (08/23 0703) Pulse Rate:  [62-96] 89  (08/23 0703) Resp:  [15-20] 17  (08/23 0703) BP: (101-139)/(35-58) 135/48 mmHg (08/23 0703) SpO2:  [93 %-100 %] 96 % (08/23 0703) Weight:  [65.772 kg (145 lb)-67.132 kg (148 lb)] 65.772 kg (145 lb) (08/22 1407)  Intake/Output from previous day: 08/22 0701 - 08/23 0700 In: 1593 [P.O.:360; I.V.:1233] Out: 300 [Urine:300] Intake/Output this shift:     Basename 03/26/12 0742  HGB 14.5    Basename 03/26/12 0742  WBC 10.6*  RBC 4.56  HCT 41.7  PLT 325    Basename 03/26/12 0742  NA 137  K 5.1  CL 98  CO2 26  BUN 14  CREATININE 0.76  GLUCOSE 217*  CALCIUM 9.5     wn wd woman in nad.  A and O x 4.  Mood and affect normal.  R ankle splinted.  NVI at toes.  Assessment/Plan: 1 Day Post-Op Procedure(s) (LRB): OPEN REDUCTION INTERNAL FIXATION (ORIF) ANKLE FRACTURE (Right) Up with therapy  Plan SNF placement on Monday.  Wylene Simmer 03/27/2012, 7:56 AM

## 2012-03-27 NOTE — Progress Notes (Signed)
Order received, chart reviewed and noted that per MD note (03/27/2012) pt for SNF probably Monday. Spoke with PT who says that after seeing the pt they (pt and PT) are thinking SNF as well. Will defer OT eval to that facility. Acute OT will sign off.

## 2012-03-27 NOTE — Op Note (Signed)
Amber Ballard, Amber Ballard NO.:  [id number]  MEDICAL RECORD NO.:  [id number]  LOCATION:  [id number]                        FACILITY:  [facility]  PHYSICIAN:  Wylene Simmer, MD        DATE OF BIRTH:  [birth year]  DATE OF PROCEDURE:  03/26/2012 DATE OF DISCHARGE:                              OPERATIVE REPORT   PREOPERATIVE DIAGNOSIS:  Right ankle trimalleolar fracture.  POSTOPERATIVE DIAGNOSIS:  Right ankle trimalleolar fracture.  PROCEDURES: 1. Open reduction internal fixation of right ankle trimalleolar     fracture with fixation of posterior malleolus. 2. Intraoperative interpretation of fluoroscopic imaging.  SURGEON:  Wylene Simmer, MD  ANESTHESIA:  General, regional.  ESTIMATED BLOOD LOSS:  Minimal.  TOURNIQUET TIME:  56 minutes at 250 mmHg.  COMPLICATIONS:  None apparent.  DISPOSITION:  Extubated, awake, and stable to recovery room.  INDICATIONS FOR PROCEDURE:  This patient is a 78 year old woman who injured her right ankle last week.  She presents now for operative treatment of her displaced right ankle trimalleolar fracture.  She understands the risks and benefits, the alternative treatment options, and elects surgical treatment.  She specifically understands risks of bleeding, infection, nerve damage, blood clots, need for additional surgery, amputation, and death.  PROCEDURE IN DETAIL:  After preoperative consent was obtained and the correct operative site was identified, the patient was brought to the operating room and placed supine on the operating table.  General anesthesia was induced.  Preoperative antibiotics were administered. Surgical time-out was taken.  The right lower extremity was prepped and draped in standard sterile fashion with tourniquet around the thigh. Extremity was exsanguinated and the tourniquet was inflated to 250 mmHg. A longitudinal incision was made over the lateral malleolus.  The lateral malleolus fracture was noted to have  severe comminution.  The lag screw was not possible due to this comminution.  Locking plate was selected and contoured to fit the lateral malleolus.  The fracture was reduced.  The plate was applied and fixed proximally with 3 bicortical screws and distally with 3 unicortical screws.  Leads were placed while holding the fracture in a reduced position with a lobster claw.  The posterior malleolus fracture was reduced.  A K-wire was inserted from anterior to posterior through the distal tibia.  AP and lateral fluoroscopic images showed appropriate reduction of the posterior malleolus fracture.  A 4-mm cannulated partially threaded screw was then inserted over the guidewire.  It was noted to have good purchase.  AP and lateral views showed appropriate reduction of the fracture and appropriate position and length of the screw.  Attention was then turned to the medial malleolus fracture.  This was noted to be severely comminuted.  The skin overlying it was noted to be somewhat tenuous. The decision was made to proceed with percutaneous fixation.  The fracture was reduced in closed fashion.  The K-wire was inserted percutaneously through the tip of the fracture.  AP and lateral views showed appropriate position of the fracture and appropriate position of the guide wire.  A 4-mm cannulated partially threaded screw was then inserted and noted to have appropriate purchase.  Final AP, lateral, and mortise views were  obtained, showing appropriate reduction of the fracture and appropriate position and length of all hardware.  The wounds were irrigated copiously.  Inverted simple sutures of 2-0 Vicryl were used to close subcutaneous tissue and periosteum of the lateral plate.  Subcutaneous tissue was approximated with inverted simple sutures of 3-0 Monocryl, running 3-0 Prolene was used to close the lateral incision.  Horizontal mattress sutures of 3-0 Prolene were used to close the anterior medial  incisions.  Sterile dressings were applied followed by a well-padded short-leg splint.  Tourniquet was released at 53 minutes after application of the dressings.  The patient was then awakened from anesthesia and transported to the recovery room in stable condition.  FOLLOWUP PLAN:  The patient will be admitted to the hospital.  She will be observed overnight for pain control.  She will have Physical Therapy and Occupational Therapy evaluations.     Wylene Simmer, MD     JH/MEDQ  D:  03/26/2012  T:  03/27/2012  Job:  [id number]

## 2012-03-28 LAB — GLUCOSE, CAPILLARY
Glucose-Capillary: 86 mg/dL (ref 70–99)
Glucose-Capillary: 121 mg/dL — ABNORMAL HIGH (ref 70–99)

## 2012-03-28 NOTE — Progress Notes (Signed)
Orthopedics Progress Note  Subjective: I feel better this AM  Objective:  Filed Vitals:   03/28/12 0514  BP: 127/44  Pulse: 64  Temp: 98.5 F (36.9 C)  Resp: 18    General: Awake and alert  Musculoskeletal: Right LE with SLS in good condition, fitting well. Calf supple, no cords Wiggles toes well without pain. Min swelling noted in the toes. Neurovascularly intact  Lab Results  Component Value Date   WBC 10.6* 03/26/2012   HGB 14.5 03/26/2012   HCT 41.7 03/26/2012   MCV 91.4 03/26/2012   PLT 325 03/26/2012       Component Value Date/Time   NA 137 03/26/2012 0742   K 5.1 03/26/2012 0742   CL 98 03/26/2012 0742   CO2 26 03/26/2012 0742   GLUCOSE 217* 03/26/2012 0742   BUN 14 03/26/2012 0742   CREATININE 0.76 03/26/2012 0742   CALCIUM 9.5 03/26/2012 0742   GFRNONAA 79* 03/26/2012 0742   GFRAA >90 03/26/2012 0742    No results found for this basename: INR, PROTIME    Assessment/Plan: POD #1 s/p Procedure(s): OPEN REDUCTION INTERNAL FIXATION (ORIF) ANKLE FRACTURE Doing well this AM. Strict NWB on the right LE. Plan d/c to SNF on Monday.  Doran Heater. Veverly Fells, MD 03/28/2012 9:34 AM

## 2012-03-29 LAB — GLUCOSE, CAPILLARY
Glucose-Capillary: 283 mg/dL — ABNORMAL HIGH (ref 70–99)
Glucose-Capillary: 361 mg/dL — ABNORMAL HIGH (ref 70–99)

## 2012-03-29 MED ORDER — ASPIRIN 325 MG PO TBEC: 325.0000 mg | DELAYED_RELEASE_TABLET | Freq: Every day | ORAL | Status: AC

## 2012-03-29 MED ORDER — SENNA 8.6 MG PO TABS: 2.0000 | ORAL_TABLET | Freq: Two times a day (BID) | ORAL | Status: AC

## 2012-03-29 MED ORDER — RIVAROXABAN 10 MG PO TABS
10.0000 mg | ORAL_TABLET | Freq: Every day | ORAL | Status: DC
  Administered 2012-03-29 – 2012-03-30 (×2): 10 mg via ORAL
  Filled 2012-03-29 (×2): qty 1

## 2012-03-29 MED ORDER — OXYCODONE HCL 5 MG PO TABS: 5.0000 mg | ORAL_TABLET | ORAL | Status: AC | PRN

## 2012-03-29 MED ORDER — DSS 100 MG PO CAPS: 100.0000 mg | ORAL_CAPSULE | Freq: Two times a day (BID) | ORAL | Status: AC

## 2012-03-29 MED ORDER — ASPIRIN 325 MG PO TBEC: 325.0000 mg | DELAYED_RELEASE_TABLET | Freq: Two times a day (BID) | ORAL | Status: DC

## 2012-03-29 MED ORDER — RIVAROXABAN 10 MG PO TABS: 10.0000 mg | ORAL_TABLET | Freq: Every day | ORAL | Status: DC

## 2012-03-29 NOTE — Progress Notes (Signed)
CSW met with pt and provided current bed offer of [facility]. Pt expecting offer from [facility], but no response from them as of today. Weekday CSW to f/u with [facility] admissions on Monday. Pt not willing to expand SNF search to other counties. Anticipate d/c Monday.  Wandra Feinstein, MSW, [place] (Weekends 8:00am-4:30pm)

## 2012-03-29 NOTE — Discharge Summary (Signed)
Physician Discharge Summary  Patient ID: Amber Ballard MRN: [id number] DOB/AGE: [birth year] 78 y.o.  Admit date: 03/26/2012 Discharge date: 03/29/2012  Admission Diagnoses:  Right ankle fracture, h/o breast cancer, diabetes, anxiety  Discharge Diagnoses:  Right ankle fracture s/p ORIF, h/o breast cancer, diabetes, anxiety, htn  Discharged Condition: stable  Hospital Course: Pt was admitted on 8/22 and underwent ORIF of her right ankle fracture.  She tolerated the procedure well and was transferred to the inpt ward.  She has PT and OT consultations recommending SNF placement.  She was started on xarelto for DVT prophylaxis.  She is discharged to SNF on 03/30/12.  Consults: None  Significant Diagnostic Studies: none  Treatments: surgery: ORIF R ankle  Discharge Exam: Blood pressure 117/48, pulse 71, temperature 98.3 F (36.8 C), temperature source Oral, resp. rate 18, height 5\' 2"  (1.575 m), weight 65.772 kg (145 lb), SpO2 95.00%. wn wd woman in nad.  A and O.  R LE splinted.  Wiggles toes.  Feels LT.  Brisk cap refill at toes.  Disposition: to SNF.  Discharge Orders    Future Orders Please Complete By Expires   Diet Carb Modified      Call MD / Call 911      Comments:   If you experience chest pain or shortness of breath, CALL 911 and be transported to the hospital emergency room.  If you develope a fever above 101 F, pus (white drainage) or increased drainage or redness at the wound, or calf pain, call your surgeon's office.   Constipation Prevention      Comments:   Drink plenty of fluids.  Prune juice may be helpful.  You may use a stool softener, such as Colace (over the counter) 100 mg twice a day.  Use MiraLax (over the counter) for constipation as needed.   Increase activity slowly as tolerated      Non weight bearing      Scheduling Instructions:   Right lower extremity.     Medication List  As of 03/29/2012 12:32 PM   STOP taking these medications         traMADol 50  MG tablet         TAKE these medications         ALPRAZolam 0.25 MG tablet   Commonly known as: XANAX   Take 0.25 mg by mouth at bedtime as needed. For sleep      aspirin 325 MG EC tablet   Take 1 tablet (325 mg total) by mouth daily.      AZOR 10-40 MG per tablet   Generic drug: amLODipine-olmesartan   Take 1 tablet by mouth daily.      citalopram 20 MG tablet   Commonly known as: CELEXA   Take 20 mg by mouth daily.      DSS 100 MG Caps   Take 100 mg by mouth 2 (two) times daily.      FIBER LAXATIVE PO   Take 1 tablet by mouth daily.      hydrochlorothiazide 25 MG tablet   Commonly known as: HYDRODIURIL   Take 12.5 mg by mouth daily.      insulin aspart 100 UNIT/ML injection   Commonly known as: novoLOG   Inject 5-10 Units into the skin 3 (three) times daily before meals. Sliding scale      insulin detemir 100 UNIT/ML injection   Commonly known as: LEVEMIR   Inject 20 Units into the skin at bedtime.  levothyroxine 50 MCG tablet   Commonly known as: SYNTHROID, LEVOTHROID   Take 50 mcg by mouth daily.      metoprolol succinate 25 MG 24 hr tablet   Commonly known as: TOPROL-XL   Take 25 mg by mouth daily.      oxyCODONE 5 MG immediate release tablet   Commonly known as: Oxy IR/ROXICODONE   Take 1 tablet (5 mg total) by mouth every 4 (four) hours as needed.      potassium chloride SA 20 MEQ tablet   Commonly known as: K-DUR,KLOR-CON   Take 20 mEq by mouth daily.      promethazine 25 MG tablet   Commonly known as: PHENERGAN   Take 25 mg by mouth every 6 (six) hours as needed. For nausea      rivaroxaban 10 MG Tabs tablet   Commonly known as: XARELTO   Take 1 tablet (10 mg total) by mouth daily.      rosuvastatin 20 MG tablet   Commonly known as: CRESTOR   Take 20 mg by mouth daily.      senna 8.6 MG Tabs   Commonly known as: SENOKOT   Take 2 tablets (17.2 mg total) by mouth 2 (two) times daily.      vitamin B-12 1000 MCG tablet   Commonly known  as: CYANOCOBALAMIN   Take 1,000 mcg by mouth daily.           Follow-up Information    Follow up with Ngoc Daughtridge, Jenny Reichmann, MD. Schedule an appointment as soon as possible for a visit in 2 weeks.   Contact information:   [address] [place] [address] [place] [phone]         Pt will need PT for mobility.  She is NWB on the R LE.  The splint should remain on and intact until she follows up with me in clinic.  SignedWylene Simmer 03/29/2012, 12:32 PM

## 2012-03-29 NOTE — Progress Notes (Signed)
Subjective: 3 Days Post-Op Procedure(s) (LRB): OPEN REDUCTION INTERNAL FIXATION (ORIF) ANKLE FRACTURE (Right) Patient reports pain as mild.  Making slow progress with PT.  Objective: Vital signs in last 24 hours: Temp:  [97.8 F (36.6 C)-98.3 F (36.8 C)] 98.3 F (36.8 C) (08/25 0548) Pulse Rate:  [71-72] 71  (08/25 0548) Resp:  [18] 18  (08/25 0548) BP: (98-117)/(42-48) 117/48 mmHg (08/25 0548) SpO2:  [95 %-96 %] 95 % (08/25 0548)  Intake/Output from previous day: 08/24 0701 - 08/25 0700 In: 900 [P.O.:900] Out: -  Intake/Output this shift:    No results found for this basename: HGB:5 in the last 72 hours No results found for this basename: WBC:2,RBC:2,HCT:2,PLT:2 in the last 72 hours No results found for this basename: NA:2,K:2,CL:2,CO2:2,BUN:2,CREATININE:2,GLUCOSE:2,CALCIUM:2 in the last 72 hours No results found for this basename: LABPT:2,INR:2 in the last 72 hours  R LE splinted.  Toes with brisk cap refill.  Wiggles toes and feels LT.  Assessment/Plan: 3 Days Post-Op Procedure(s) (LRB): OPEN REDUCTION INTERNAL FIXATION (ORIF) ANKLE FRACTURE (Right) Up with therapy  Continue NWB on R LE.  Hopefully SNF placement tomorrow.  Wylene Simmer 03/29/2012, 1:54 PM

## 2012-03-30 LAB — GLUCOSE, CAPILLARY: Glucose-Capillary: 250 mg/dL — ABNORMAL HIGH (ref 70–99)

## 2012-03-30 MED ORDER — GLUCOSE-VITAMIN C 4-6 GM-MG PO CHEW: 4.0000 | CHEWABLE_TABLET | ORAL | Status: DC | PRN

## 2012-03-30 MED ORDER — INSULIN ASPART 100 UNIT/ML ~~LOC~~ SOLN: 0.0000 [IU] | Freq: Three times a day (TID) | SUBCUTANEOUS | Status: DC

## 2012-03-30 MED ORDER — GLUCOSE 4 G PO CHEW
1.0000 | CHEWABLE_TABLET | Freq: Once | ORAL | Status: AC
  Administered 2012-03-30: 4 g via ORAL

## 2012-03-30 MED ORDER — ALPRAZOLAM 0.25 MG PO TABS: 0.2500 mg | ORAL_TABLET | Freq: Every evening | ORAL | Status: DC | PRN

## 2012-03-30 MED ORDER — GLUCOSE 40 % PO GEL: 1.0000 | ORAL | Status: DC | PRN

## 2012-03-30 MED ORDER — INSULIN DETEMIR 100 UNIT/ML ~~LOC~~ SOLN: 15.0000 [IU] | Freq: Every day | SUBCUTANEOUS | Status: AC

## 2012-03-30 MED ORDER — GLUCOSE 40 % PO GEL
ORAL | Status: AC
  Filled 2012-03-30: qty 1

## 2012-03-30 MED ORDER — GLUCOSE-VITAMIN C 4-6 GM-MG PO CHEW
CHEWABLE_TABLET | ORAL | Status: AC
  Administered 2012-03-30: 4 g via ORAL
  Filled 2012-03-30: qty 1

## 2012-03-30 MED ORDER — INSULIN ASPART 100 UNIT/ML ~~LOC~~ SOLN: 3.0000 [IU] | Freq: Three times a day (TID) | SUBCUTANEOUS | Status: DC

## 2012-03-30 NOTE — Progress Notes (Signed)
Clinical Social Work  CSW spoke with SNF [facility]) who is agreeable to admission today. CSW spoke with dtr who will transport patient to SNF. CSW faxed dc summary and medications to SNF. A hard script for Xanax is needed for patient. CSW paged MD who stated when he rounds at lunchtime he can complete prescriptions. CSW informed patient of dc. CSW provided RN with report number and asked RN to give patient packet at dc. CSW is signing off but available if needed.  [place], [place] [phone]

## 2012-03-30 NOTE — Progress Notes (Signed)
Inpatient Diabetes Program Recommendations  AACE/ADA: New Consensus Statement on Inpatient Glycemic Control (2013)  Target Ranges:  Prepandial:   less than 140 mg/dL      Peak postprandial:   less than 180 mg/dL (1-2 hours)      Critically ill patients:  140 - 180 mg/dL   Reason for Visit: Telephone call from Charge Nurse.  Concerns regarding glycemic control raised at [facility] this morning.  Note:  For discharge today to [facility].  CBG's swinging high and low on home dose of Levemir 20 units at HS and Novolog moderate correction scale. Home Novolog dose per med rec is 5 to 10 units tid with meals but no specific parameters included.    Results for HONESTI, PATSCHKE (MRN [id number]) as of 03/30/2012 11:50  Ref. Range 03/29/2012 07:50 03/29/2012 14:00 03/29/2012 17:21 03/29/2012 21:22 03/29/2012 22:13 03/30/2012 07:53 03/30/2012 08:17  Glucose-Capillary Latest Range: 70-99 mg/dL 71 361 (H) 238 (H) 64 (L) 139 (H) 40 (LL) 109 (H)    CBG pattern suggests that Levemir dose could be reduced by 4 to 5 units to 15 or 16 units at HS to help avoid the hypoglycemia before breakfast.  The addition of low dose meal coverage 3 units tid with meals plus sensitive correction beginning at 151 mg/dl would help prevent some of the hyperglycemia patient is experiencing in response to eating meals.  Further titration of Levemir dose and meal coverage Novolog needs to be made by the receiving SNF.  Spoke with Dr. Doran Durand regarding recommendations.  Thank you. Ruari Duggan S. Marcelline Mates, RN, [place], [place]  [phone])

## 2012-03-30 NOTE — Progress Notes (Signed)
Physical Therapy Treatment Patient Details Name: Amber Ballard MRN: [id number] DOB: 1932/06/11 Today's Date: 03/30/2012 Time: [id number] PT Time Calculation (min): 10 min  PT Assessment / Plan / Recommendation Comments on Treatment Session  Pt awaiting daughter's arrival and reported that she felt they could manage transfer to/from car without difficulty. Pt performed transfer to [facility] prior to daughter's arrival and then back to bed once daughter arrived. Daughter confirmed they would not have any trouble getting into/out of car and denied need for RW to borrow for car transfer (pt will do squat-pivot as during PT session). All questions answered and RN aware pt/family prepared for d/c.     Follow Up Recommendations  Skilled nursing facility    Barriers to Discharge        Equipment Recommendations  Defer to next venue    Recommendations for Other Services    Frequency Min 5X/week   Plan Discharge plan remains appropriate    Precautions / Restrictions Precautions Precautions: Fall Restrictions Weight Bearing Restrictions: Yes RLE Weight Bearing: Non weight bearing   Pertinent Vitals/Pain No signs or reports of pain    Mobility  Bed Mobility Bed Mobility: Supine to Sit;Sit to Supine Supine to Sit: 6: Modified independent (Device/Increase time);HOB elevated (20 degrees) Sit to Supine: 6: Modified independent (Device/Increase time);HOB elevated (20 degrees) Transfers Transfers: Public house manager;Sit to Stand;Stand to Sit Sit to Stand: 5: Supervision;From chair/3-in-1;With upper extremity assist Stand to Sit: 5: Supervision;With upper extremity assist;With armrests;To chair/3-in-1 Squat Pivot Transfers: 4: Min guard;With upper extremity assistance;With armrests (bed to [facility] to bed (simulating car transfer)) Details for Transfer Assistance: Pt maintaining NWB RLE without difficulty; supervision for safety and to stabilize BSC: sit to stand for pericare with pt able to maintain  NWB Ambulation/Gait Ambulation/Gait Assistance: Not tested (comment)    Exercises     PT Diagnosis:    PT Problem List:   PT Treatment Interventions:     PT Goals Acute Rehab PT Goals Pt will go Supine/Side to Sit: with modified independence PT Goal: Supine/Side to Sit - Progress: Met Pt will go Sit to Supine/Side: with modified independence PT Goal: Sit to Supine/Side - Progress: Met Pt will go Sit to Stand: with modified independence PT Goal: Sit to Stand - Progress: Progressing toward goal Pt will go Stand to Sit: with modified independence PT Goal: Stand to Sit - Progress: Progressing toward goal Pt will Ambulate: 16 - 50 feet;with modified independence;with rolling walker PT Goal: Ambulate - Progress: Not met  Visit Information  Last PT Received On: 03/30/12 Assistance Needed: +1    Subjective Data  Subjective: Pt reports she and her daughter managed at home for 2 weeks prior to returning for ORIF Patient Stated Goal: To eventually go home   Cognition  Overall Cognitive Status: Appears within functional limits for tasks assessed/performed Arousal/Alertness: Awake/alert Behavior During Session: [place] for tasks performed    Balance     End of Session PT - End of Session Activity Tolerance: Patient tolerated treatment well Patient left: in bed;with call bell/phone within reach;with family/visitor present Nurse Communication: Mobility status;Other (comment) (family present and ready to transport pt to SNF)   GP     Adler Chartrand 03/30/2012, 2:05 PM Pager [phone]

## 2012-03-30 NOTE — Progress Notes (Signed)
Clinical Social Work  CSW spoke with MD who reported he had changed dc summary. CSW sent updated dc summary to SNF. CSW called and spoke with [facility] who reported that they could accept patient with this change. CSW informed RN, patient and dtr of plans. Dtr will transport. CSW is signing off.  [place], Amber Ballard [phone]

## 2012-03-30 NOTE — Progress Notes (Signed)
Discharged instructions reviewed with patient's daughter.  Instructions to be given to [facility] in [place] via daughter.  Report called to [place] at [facility] in [place].  Caelynn Marshman Terral

## 2012-03-30 NOTE — Discharge Summary (Signed)
Physician Discharge Summary  Patient ID: Amber Ballard MRN: [id number] DOB/AGE: [birth year] 78 y.o.  Admit date: 03/26/2012 Discharge date: 03/30/2012  Admission Diagnoses:  Diabetes, htn, right ankl fracture  Discharge Diagnoses:  Same, s/p ORIF right ankle fracture  Discharged Condition: stable  Hospital Course: Pt was admitted and underwent ORIF of her right ankle on 8/22.  She tolerated the procedure well and had an uncomplicated  Course with the exception of episodes of hyper and hypo glycemia.  Her insulin doses have been adjusted accordingly.  Consults: diabetes management  Significant Diagnostic Studies: none  Treatments: therapies: PT and OT  Discharge Exam: Blood pressure 123/52, pulse 83, temperature 97.8 F (36.6 C), temperature source Oral, resp. rate 18, height 5\' 2"  (1.575 m), weight 65.772 kg (145 lb), SpO2 96.00%. wn wd woman in nad.  R LE splinted.  brisk cap refill at toes.  Feels LT.  Active PF and DF at toes.  Disposition: snf  Discharge Orders    Future Orders Please Complete By Expires   Diet Carb Modified      Diet - low sodium heart healthy      Call MD / Call 911      Comments:   If you experience chest pain or shortness of breath, CALL 911 and be transported to the hospital emergency room.  If you develope a fever above 101 F, pus (white drainage) or increased drainage or redness at the wound, or calf pain, call your surgeon's office.   Constipation Prevention      Comments:   Drink plenty of fluids.  Prune juice may be helpful.  You may use a stool softener, such as Colace (over the counter) 100 mg twice a day.  Use MiraLax (over the counter) for constipation as needed.   Increase activity slowly as tolerated      Non weight bearing      Scheduling Instructions:   Right lower extremity.   Call MD / Call 911      Comments:   If you experience chest pain or shortness of breath, CALL 911 and be transported to the hospital emergency room.  If you  develope a fever above 101 F, pus (white drainage) or increased drainage or redness at the wound, or calf pain, call your surgeon's office.   Constipation Prevention      Comments:   Drink plenty of fluids.  Prune juice may be helpful.  You may use a stool softener, such as Colace (over the counter) 100 mg twice a day.  Use MiraLax (over the counter) for constipation as needed.   Increase activity slowly as tolerated        Medication List  As of 03/30/2012  1:06 PM   STOP taking these medications         traMADol 50 MG tablet         TAKE these medications         ALPRAZolam 0.25 MG tablet   Commonly known as: XANAX   Take 1 tablet (0.25 mg total) by mouth at bedtime as needed. For sleep      aspirin 325 MG EC tablet   Take 1 tablet (325 mg total) by mouth daily.      AZOR 10-40 MG per tablet   Generic drug: amLODipine-olmesartan   Take 1 tablet by mouth daily.      citalopram 20 MG tablet   Commonly known as: CELEXA   Take 20 mg by mouth daily.  dextrose 40 % Gel   Commonly known as: GLUTOSE   Take 37.5 g by mouth as needed (CBG less than 70 or CBG greater than 70 and next meal is more than 1 hours away).      DSS 100 MG Caps   Take 100 mg by mouth 2 (two) times daily.      FIBER LAXATIVE PO   Take 1 tablet by mouth daily.      hydrochlorothiazide 25 MG tablet   Commonly known as: HYDRODIURIL   Take 12.5 mg by mouth daily.      insulin aspart 100 UNIT/ML injection   Commonly known as: novoLOG   Inject 0-9 Units into the skin 3 (three) times daily with meals.      insulin aspart 100 UNIT/ML injection   Commonly known as: novoLOG   Inject 3 Units into the skin 3 (three) times daily with meals.      insulin aspart 100 UNIT/ML injection   Commonly known as: novoLOG   Inject 5-10 Units into the skin 3 (three) times daily before meals. Sliding scale      insulin detemir 100 UNIT/ML injection   Commonly known as: LEVEMIR   Inject 15 Units into the skin at  bedtime.      levothyroxine 50 MCG tablet   Commonly known as: SYNTHROID, LEVOTHROID   Take 50 mcg by mouth daily.      metoprolol succinate 25 MG 24 hr tablet   Commonly known as: TOPROL-XL   Take 25 mg by mouth daily.      oxyCODONE 5 MG immediate release tablet   Commonly known as: Oxy IR/ROXICODONE   Take 1 tablet (5 mg total) by mouth every 4 (four) hours as needed.      potassium chloride SA 20 MEQ tablet   Commonly known as: K-DUR,KLOR-CON   Take 20 mEq by mouth daily.      promethazine 25 MG tablet   Commonly known as: PHENERGAN   Take 25 mg by mouth every 6 (six) hours as needed. For nausea      rivaroxaban 10 MG Tabs tablet   Commonly known as: XARELTO   Take 1 tablet (10 mg total) by mouth daily.      rosuvastatin 20 MG tablet   Commonly known as: CRESTOR   Take 20 mg by mouth daily.      senna 8.6 MG Tabs   Commonly known as: SENOKOT   Take 2 tablets (17.2 mg total) by mouth 2 (two) times daily.      vitamin B-12 1000 MCG tablet   Commonly known as: CYANOCOBALAMIN   Take 1,000 mcg by mouth daily.           Follow-up Information    Follow up with Bradrick Kamau, Jenny Reichmann, MD. Schedule an appointment as soon as possible for a visit in 2 weeks.   Contact information:   [address] [place] [phone]         Pt will need to have close follow up of her blood sugar levels by SNF MD and titration of her insulin doses as indicated.  SignedWylene Simmer 03/30/2012, 1:06 PM

## 2012-04-01 ENCOUNTER — Encounter (HOSPITAL_COMMUNITY): Payer: Self-pay

## 2015-10-11 DIAGNOSIS — C50919 Malignant neoplasm of unspecified site of unspecified female breast: Secondary | ICD-10-CM | POA: Diagnosis not present

## 2015-10-11 DIAGNOSIS — E669 Obesity, unspecified: Secondary | ICD-10-CM | POA: Diagnosis not present

## 2015-10-11 DIAGNOSIS — I10 Essential (primary) hypertension: Secondary | ICD-10-CM | POA: Diagnosis not present

## 2015-10-11 DIAGNOSIS — E119 Type 2 diabetes mellitus without complications: Secondary | ICD-10-CM | POA: Diagnosis not present

## 2015-10-11 DIAGNOSIS — E039 Hypothyroidism, unspecified: Secondary | ICD-10-CM | POA: Diagnosis not present

## 2015-10-11 DIAGNOSIS — E785 Hyperlipidemia, unspecified: Secondary | ICD-10-CM | POA: Diagnosis not present

## 2015-10-11 DIAGNOSIS — F329 Major depressive disorder, single episode, unspecified: Secondary | ICD-10-CM | POA: Diagnosis not present

## 2015-10-11 DIAGNOSIS — Z6830 Body mass index (BMI) 30.0-30.9, adult: Secondary | ICD-10-CM | POA: Diagnosis not present

## 2015-10-11 DIAGNOSIS — Z780 Asymptomatic menopausal state: Secondary | ICD-10-CM | POA: Diagnosis not present

## 2015-10-20 DIAGNOSIS — Z780 Asymptomatic menopausal state: Secondary | ICD-10-CM | POA: Diagnosis not present

## 2015-10-20 DIAGNOSIS — M8580 Other specified disorders of bone density and structure, unspecified site: Secondary | ICD-10-CM | POA: Diagnosis not present

## 2015-10-20 DIAGNOSIS — M85851 Other specified disorders of bone density and structure, right thigh: Secondary | ICD-10-CM | POA: Diagnosis not present

## 2016-01-11 DIAGNOSIS — E1165 Type 2 diabetes mellitus with hyperglycemia: Secondary | ICD-10-CM | POA: Diagnosis not present

## 2016-01-11 DIAGNOSIS — E785 Hyperlipidemia, unspecified: Secondary | ICD-10-CM | POA: Diagnosis not present

## 2016-01-11 DIAGNOSIS — E039 Hypothyroidism, unspecified: Secondary | ICD-10-CM | POA: Diagnosis not present

## 2016-01-11 DIAGNOSIS — E669 Obesity, unspecified: Secondary | ICD-10-CM | POA: Diagnosis not present

## 2016-01-11 DIAGNOSIS — C50919 Malignant neoplasm of unspecified site of unspecified female breast: Secondary | ICD-10-CM | POA: Diagnosis not present

## 2016-01-11 DIAGNOSIS — Z6830 Body mass index (BMI) 30.0-30.9, adult: Secondary | ICD-10-CM | POA: Diagnosis not present

## 2016-01-11 DIAGNOSIS — I10 Essential (primary) hypertension: Secondary | ICD-10-CM | POA: Diagnosis not present

## 2016-04-12 DIAGNOSIS — E039 Hypothyroidism, unspecified: Secondary | ICD-10-CM | POA: Diagnosis not present

## 2016-04-12 DIAGNOSIS — E1165 Type 2 diabetes mellitus with hyperglycemia: Secondary | ICD-10-CM | POA: Diagnosis not present

## 2016-04-12 DIAGNOSIS — E785 Hyperlipidemia, unspecified: Secondary | ICD-10-CM | POA: Diagnosis not present

## 2016-04-12 DIAGNOSIS — I10 Essential (primary) hypertension: Secondary | ICD-10-CM | POA: Diagnosis not present

## 2016-04-12 DIAGNOSIS — C50919 Malignant neoplasm of unspecified site of unspecified female breast: Secondary | ICD-10-CM | POA: Diagnosis not present

## 2016-04-12 DIAGNOSIS — Z6830 Body mass index (BMI) 30.0-30.9, adult: Secondary | ICD-10-CM | POA: Diagnosis not present

## 2016-07-12 DIAGNOSIS — C50919 Malignant neoplasm of unspecified site of unspecified female breast: Secondary | ICD-10-CM | POA: Diagnosis not present

## 2016-07-12 DIAGNOSIS — Z1389 Encounter for screening for other disorder: Secondary | ICD-10-CM | POA: Diagnosis not present

## 2016-07-12 DIAGNOSIS — E538 Deficiency of other specified B group vitamins: Secondary | ICD-10-CM | POA: Diagnosis not present

## 2016-07-12 DIAGNOSIS — Z6829 Body mass index (BMI) 29.0-29.9, adult: Secondary | ICD-10-CM | POA: Diagnosis not present

## 2016-07-12 DIAGNOSIS — Z79899 Other long term (current) drug therapy: Secondary | ICD-10-CM | POA: Diagnosis not present

## 2016-07-12 DIAGNOSIS — I10 Essential (primary) hypertension: Secondary | ICD-10-CM | POA: Diagnosis not present

## 2016-07-12 DIAGNOSIS — E785 Hyperlipidemia, unspecified: Secondary | ICD-10-CM | POA: Diagnosis not present

## 2016-07-12 DIAGNOSIS — Z23 Encounter for immunization: Secondary | ICD-10-CM | POA: Diagnosis not present

## 2016-07-12 DIAGNOSIS — F329 Major depressive disorder, single episode, unspecified: Secondary | ICD-10-CM | POA: Diagnosis not present

## 2016-07-12 DIAGNOSIS — E039 Hypothyroidism, unspecified: Secondary | ICD-10-CM | POA: Diagnosis not present

## 2016-07-12 DIAGNOSIS — E559 Vitamin D deficiency, unspecified: Secondary | ICD-10-CM | POA: Diagnosis not present

## 2016-07-12 DIAGNOSIS — E663 Overweight: Secondary | ICD-10-CM | POA: Diagnosis not present

## 2016-07-12 DIAGNOSIS — E1165 Type 2 diabetes mellitus with hyperglycemia: Secondary | ICD-10-CM | POA: Diagnosis not present

## 2016-07-12 DIAGNOSIS — Z9181 History of falling: Secondary | ICD-10-CM | POA: Diagnosis not present

## 2016-10-10 DIAGNOSIS — F329 Major depressive disorder, single episode, unspecified: Secondary | ICD-10-CM | POA: Diagnosis not present

## 2016-10-10 DIAGNOSIS — C50919 Malignant neoplasm of unspecified site of unspecified female breast: Secondary | ICD-10-CM | POA: Diagnosis not present

## 2016-10-10 DIAGNOSIS — E1165 Type 2 diabetes mellitus with hyperglycemia: Secondary | ICD-10-CM | POA: Diagnosis not present

## 2016-10-10 DIAGNOSIS — M818 Other osteoporosis without current pathological fracture: Secondary | ICD-10-CM | POA: Diagnosis not present

## 2016-10-10 DIAGNOSIS — E663 Overweight: Secondary | ICD-10-CM | POA: Diagnosis not present

## 2016-10-10 DIAGNOSIS — Z6829 Body mass index (BMI) 29.0-29.9, adult: Secondary | ICD-10-CM | POA: Diagnosis not present

## 2016-10-10 DIAGNOSIS — I10 Essential (primary) hypertension: Secondary | ICD-10-CM | POA: Diagnosis not present

## 2016-10-10 DIAGNOSIS — E785 Hyperlipidemia, unspecified: Secondary | ICD-10-CM | POA: Diagnosis not present

## 2016-10-10 DIAGNOSIS — Z79899 Other long term (current) drug therapy: Secondary | ICD-10-CM | POA: Diagnosis not present

## 2016-10-10 DIAGNOSIS — E039 Hypothyroidism, unspecified: Secondary | ICD-10-CM | POA: Diagnosis not present

## 2016-10-10 DIAGNOSIS — E538 Deficiency of other specified B group vitamins: Secondary | ICD-10-CM | POA: Diagnosis not present

## 2017-01-10 DIAGNOSIS — Z1389 Encounter for screening for other disorder: Secondary | ICD-10-CM | POA: Diagnosis not present

## 2017-01-10 DIAGNOSIS — M818 Other osteoporosis without current pathological fracture: Secondary | ICD-10-CM | POA: Diagnosis not present

## 2017-01-10 DIAGNOSIS — E039 Hypothyroidism, unspecified: Secondary | ICD-10-CM | POA: Diagnosis not present

## 2017-01-10 DIAGNOSIS — F329 Major depressive disorder, single episode, unspecified: Secondary | ICD-10-CM | POA: Diagnosis not present

## 2017-01-10 DIAGNOSIS — E785 Hyperlipidemia, unspecified: Secondary | ICD-10-CM | POA: Diagnosis not present

## 2017-01-10 DIAGNOSIS — E1165 Type 2 diabetes mellitus with hyperglycemia: Secondary | ICD-10-CM | POA: Diagnosis not present

## 2017-01-10 DIAGNOSIS — Z79899 Other long term (current) drug therapy: Secondary | ICD-10-CM | POA: Diagnosis not present

## 2017-01-10 DIAGNOSIS — E538 Deficiency of other specified B group vitamins: Secondary | ICD-10-CM | POA: Diagnosis not present

## 2017-01-10 DIAGNOSIS — C50919 Malignant neoplasm of unspecified site of unspecified female breast: Secondary | ICD-10-CM | POA: Diagnosis not present

## 2017-01-10 DIAGNOSIS — I10 Essential (primary) hypertension: Secondary | ICD-10-CM | POA: Diagnosis not present

## 2017-04-14 DIAGNOSIS — Z6827 Body mass index (BMI) 27.0-27.9, adult: Secondary | ICD-10-CM | POA: Diagnosis not present

## 2017-04-14 DIAGNOSIS — E039 Hypothyroidism, unspecified: Secondary | ICD-10-CM | POA: Diagnosis not present

## 2017-04-14 DIAGNOSIS — E785 Hyperlipidemia, unspecified: Secondary | ICD-10-CM | POA: Diagnosis not present

## 2017-04-14 DIAGNOSIS — Z79899 Other long term (current) drug therapy: Secondary | ICD-10-CM | POA: Diagnosis not present

## 2017-04-14 DIAGNOSIS — F329 Major depressive disorder, single episode, unspecified: Secondary | ICD-10-CM | POA: Diagnosis not present

## 2017-04-14 DIAGNOSIS — I10 Essential (primary) hypertension: Secondary | ICD-10-CM | POA: Diagnosis not present

## 2017-04-14 DIAGNOSIS — E119 Type 2 diabetes mellitus without complications: Secondary | ICD-10-CM | POA: Diagnosis not present

## 2017-04-14 DIAGNOSIS — E559 Vitamin D deficiency, unspecified: Secondary | ICD-10-CM | POA: Diagnosis not present

## 2017-04-14 DIAGNOSIS — C50919 Malignant neoplasm of unspecified site of unspecified female breast: Secondary | ICD-10-CM | POA: Diagnosis not present

## 2017-07-21 DIAGNOSIS — Z23 Encounter for immunization: Secondary | ICD-10-CM | POA: Diagnosis not present

## 2017-07-21 DIAGNOSIS — Z6827 Body mass index (BMI) 27.0-27.9, adult: Secondary | ICD-10-CM | POA: Diagnosis not present

## 2017-07-21 DIAGNOSIS — E1165 Type 2 diabetes mellitus with hyperglycemia: Secondary | ICD-10-CM | POA: Diagnosis not present

## 2017-07-21 DIAGNOSIS — I10 Essential (primary) hypertension: Secondary | ICD-10-CM | POA: Diagnosis not present

## 2017-07-21 DIAGNOSIS — E538 Deficiency of other specified B group vitamins: Secondary | ICD-10-CM | POA: Diagnosis not present

## 2017-07-21 DIAGNOSIS — E039 Hypothyroidism, unspecified: Secondary | ICD-10-CM | POA: Diagnosis not present

## 2017-07-21 DIAGNOSIS — C50919 Malignant neoplasm of unspecified site of unspecified female breast: Secondary | ICD-10-CM | POA: Diagnosis not present

## 2017-07-21 DIAGNOSIS — Z9181 History of falling: Secondary | ICD-10-CM | POA: Diagnosis not present

## 2017-07-21 DIAGNOSIS — E559 Vitamin D deficiency, unspecified: Secondary | ICD-10-CM | POA: Diagnosis not present

## 2017-07-21 DIAGNOSIS — E663 Overweight: Secondary | ICD-10-CM | POA: Diagnosis not present

## 2017-07-21 DIAGNOSIS — E785 Hyperlipidemia, unspecified: Secondary | ICD-10-CM | POA: Diagnosis not present

## 2017-10-17 DIAGNOSIS — E1165 Type 2 diabetes mellitus with hyperglycemia: Secondary | ICD-10-CM | POA: Diagnosis not present

## 2017-10-17 DIAGNOSIS — Z79899 Other long term (current) drug therapy: Secondary | ICD-10-CM | POA: Diagnosis not present

## 2017-10-17 DIAGNOSIS — F40243 Fear of flying: Secondary | ICD-10-CM | POA: Diagnosis not present

## 2017-10-17 DIAGNOSIS — F329 Major depressive disorder, single episode, unspecified: Secondary | ICD-10-CM | POA: Diagnosis not present

## 2017-10-17 DIAGNOSIS — Z6827 Body mass index (BMI) 27.0-27.9, adult: Secondary | ICD-10-CM | POA: Diagnosis not present

## 2017-10-17 DIAGNOSIS — C50919 Malignant neoplasm of unspecified site of unspecified female breast: Secondary | ICD-10-CM | POA: Diagnosis not present

## 2017-10-17 DIAGNOSIS — E039 Hypothyroidism, unspecified: Secondary | ICD-10-CM | POA: Diagnosis not present

## 2017-10-17 DIAGNOSIS — E559 Vitamin D deficiency, unspecified: Secondary | ICD-10-CM | POA: Diagnosis not present

## 2017-10-17 DIAGNOSIS — E538 Deficiency of other specified B group vitamins: Secondary | ICD-10-CM | POA: Diagnosis not present

## 2017-10-17 DIAGNOSIS — I10 Essential (primary) hypertension: Secondary | ICD-10-CM | POA: Diagnosis not present

## 2017-10-17 DIAGNOSIS — R739 Hyperglycemia, unspecified: Secondary | ICD-10-CM | POA: Diagnosis not present

## 2017-10-17 DIAGNOSIS — E785 Hyperlipidemia, unspecified: Secondary | ICD-10-CM | POA: Diagnosis not present

## 2018-01-19 DIAGNOSIS — E118 Type 2 diabetes mellitus with unspecified complications: Secondary | ICD-10-CM | POA: Diagnosis not present

## 2018-01-19 DIAGNOSIS — E039 Hypothyroidism, unspecified: Secondary | ICD-10-CM | POA: Diagnosis not present

## 2018-01-19 DIAGNOSIS — F329 Major depressive disorder, single episode, unspecified: Secondary | ICD-10-CM | POA: Diagnosis not present

## 2018-01-19 DIAGNOSIS — Z853 Personal history of malignant neoplasm of breast: Secondary | ICD-10-CM | POA: Diagnosis not present

## 2018-01-19 DIAGNOSIS — E785 Hyperlipidemia, unspecified: Secondary | ICD-10-CM | POA: Diagnosis not present

## 2018-01-19 DIAGNOSIS — Z1331 Encounter for screening for depression: Secondary | ICD-10-CM | POA: Diagnosis not present

## 2018-01-19 DIAGNOSIS — E559 Vitamin D deficiency, unspecified: Secondary | ICD-10-CM | POA: Diagnosis not present

## 2018-01-19 DIAGNOSIS — Z1339 Encounter for screening examination for other mental health and behavioral disorders: Secondary | ICD-10-CM | POA: Diagnosis not present

## 2018-01-19 DIAGNOSIS — Z6826 Body mass index (BMI) 26.0-26.9, adult: Secondary | ICD-10-CM | POA: Diagnosis not present

## 2018-01-19 DIAGNOSIS — Z79899 Other long term (current) drug therapy: Secondary | ICD-10-CM | POA: Diagnosis not present

## 2018-01-19 DIAGNOSIS — I10 Essential (primary) hypertension: Secondary | ICD-10-CM | POA: Diagnosis not present

## 2018-04-21 DIAGNOSIS — I10 Essential (primary) hypertension: Secondary | ICD-10-CM | POA: Diagnosis not present

## 2018-04-21 DIAGNOSIS — E118 Type 2 diabetes mellitus with unspecified complications: Secondary | ICD-10-CM | POA: Diagnosis not present

## 2018-04-21 DIAGNOSIS — R0602 Shortness of breath: Secondary | ICD-10-CM | POA: Diagnosis not present

## 2018-04-21 DIAGNOSIS — E785 Hyperlipidemia, unspecified: Secondary | ICD-10-CM | POA: Diagnosis not present

## 2018-04-21 DIAGNOSIS — E559 Vitamin D deficiency, unspecified: Secondary | ICD-10-CM | POA: Diagnosis not present

## 2018-04-21 DIAGNOSIS — Z23 Encounter for immunization: Secondary | ICD-10-CM | POA: Diagnosis not present

## 2018-04-21 DIAGNOSIS — M7989 Other specified soft tissue disorders: Secondary | ICD-10-CM | POA: Diagnosis not present

## 2018-04-21 DIAGNOSIS — Z6825 Body mass index (BMI) 25.0-25.9, adult: Secondary | ICD-10-CM | POA: Diagnosis not present

## 2018-04-21 DIAGNOSIS — E039 Hypothyroidism, unspecified: Secondary | ICD-10-CM | POA: Diagnosis not present

## 2018-04-22 DIAGNOSIS — R600 Localized edema: Secondary | ICD-10-CM | POA: Diagnosis not present

## 2018-04-22 DIAGNOSIS — M7989 Other specified soft tissue disorders: Secondary | ICD-10-CM | POA: Diagnosis not present

## 2018-04-22 DIAGNOSIS — R7989 Other specified abnormal findings of blood chemistry: Secondary | ICD-10-CM | POA: Diagnosis not present

## 2018-04-23 DIAGNOSIS — E118 Type 2 diabetes mellitus with unspecified complications: Secondary | ICD-10-CM | POA: Diagnosis not present

## 2018-04-23 DIAGNOSIS — E785 Hyperlipidemia, unspecified: Secondary | ICD-10-CM | POA: Diagnosis not present

## 2018-04-23 DIAGNOSIS — Z6825 Body mass index (BMI) 25.0-25.9, adult: Secondary | ICD-10-CM | POA: Diagnosis not present

## 2018-04-23 DIAGNOSIS — I50810 Right heart failure, unspecified: Secondary | ICD-10-CM | POA: Diagnosis not present

## 2018-04-23 DIAGNOSIS — E559 Vitamin D deficiency, unspecified: Secondary | ICD-10-CM | POA: Diagnosis not present

## 2018-04-23 DIAGNOSIS — I10 Essential (primary) hypertension: Secondary | ICD-10-CM | POA: Diagnosis not present

## 2018-04-23 DIAGNOSIS — E039 Hypothyroidism, unspecified: Secondary | ICD-10-CM | POA: Diagnosis not present

## 2018-05-07 DIAGNOSIS — N289 Disorder of kidney and ureter, unspecified: Secondary | ICD-10-CM | POA: Diagnosis not present

## 2018-07-23 DIAGNOSIS — Z9181 History of falling: Secondary | ICD-10-CM | POA: Diagnosis not present

## 2018-07-23 DIAGNOSIS — I50810 Right heart failure, unspecified: Secondary | ICD-10-CM | POA: Diagnosis not present

## 2018-07-23 DIAGNOSIS — Z6826 Body mass index (BMI) 26.0-26.9, adult: Secondary | ICD-10-CM | POA: Diagnosis not present

## 2018-07-23 DIAGNOSIS — I10 Essential (primary) hypertension: Secondary | ICD-10-CM | POA: Diagnosis not present

## 2018-07-23 DIAGNOSIS — E785 Hyperlipidemia, unspecified: Secondary | ICD-10-CM | POA: Diagnosis not present

## 2018-07-23 DIAGNOSIS — E118 Type 2 diabetes mellitus with unspecified complications: Secondary | ICD-10-CM | POA: Diagnosis not present

## 2018-07-23 DIAGNOSIS — E039 Hypothyroidism, unspecified: Secondary | ICD-10-CM | POA: Diagnosis not present

## 2018-09-02 DIAGNOSIS — R112 Nausea with vomiting, unspecified: Secondary | ICD-10-CM | POA: Diagnosis not present

## 2018-09-02 DIAGNOSIS — I160 Hypertensive urgency: Secondary | ICD-10-CM | POA: Diagnosis not present

## 2018-09-02 DIAGNOSIS — R9431 Abnormal electrocardiogram [ECG] [EKG]: Secondary | ICD-10-CM

## 2018-09-02 DIAGNOSIS — E860 Dehydration: Secondary | ICD-10-CM | POA: Diagnosis not present

## 2018-09-02 DIAGNOSIS — R1111 Vomiting without nausea: Secondary | ICD-10-CM | POA: Diagnosis not present

## 2018-09-02 DIAGNOSIS — R270 Ataxia, unspecified: Secondary | ICD-10-CM

## 2018-09-02 DIAGNOSIS — I10 Essential (primary) hypertension: Secondary | ICD-10-CM | POA: Diagnosis not present

## 2018-09-02 DIAGNOSIS — E1165 Type 2 diabetes mellitus with hyperglycemia: Secondary | ICD-10-CM | POA: Diagnosis not present

## 2018-09-02 DIAGNOSIS — E876 Hypokalemia: Secondary | ICD-10-CM

## 2018-09-02 DIAGNOSIS — R1010 Upper abdominal pain, unspecified: Secondary | ICD-10-CM

## 2018-09-02 DIAGNOSIS — R0989 Other specified symptoms and signs involving the circulatory and respiratory systems: Secondary | ICD-10-CM | POA: Diagnosis not present

## 2018-09-02 DIAGNOSIS — R110 Nausea: Secondary | ICD-10-CM | POA: Diagnosis not present

## 2018-09-02 DIAGNOSIS — R2681 Unsteadiness on feet: Secondary | ICD-10-CM | POA: Diagnosis not present

## 2018-09-03 DIAGNOSIS — M6281 Muscle weakness (generalized): Secondary | ICD-10-CM | POA: Diagnosis not present

## 2018-09-03 DIAGNOSIS — R270 Ataxia, unspecified: Secondary | ICD-10-CM | POA: Diagnosis not present

## 2018-09-03 DIAGNOSIS — R4182 Altered mental status, unspecified: Secondary | ICD-10-CM | POA: Diagnosis not present

## 2018-09-03 DIAGNOSIS — R278 Other lack of coordination: Secondary | ICD-10-CM | POA: Diagnosis not present

## 2018-09-03 DIAGNOSIS — B9620 Unspecified Escherichia coli [E. coli] as the cause of diseases classified elsewhere: Secondary | ICD-10-CM | POA: Diagnosis not present

## 2018-09-03 DIAGNOSIS — I10 Essential (primary) hypertension: Secondary | ICD-10-CM | POA: Diagnosis not present

## 2018-09-03 DIAGNOSIS — R29700 NIHSS score 0: Secondary | ICD-10-CM | POA: Diagnosis not present

## 2018-09-03 DIAGNOSIS — F418 Other specified anxiety disorders: Secondary | ICD-10-CM | POA: Diagnosis not present

## 2018-09-03 DIAGNOSIS — E785 Hyperlipidemia, unspecified: Secondary | ICD-10-CM | POA: Diagnosis not present

## 2018-09-03 DIAGNOSIS — E039 Hypothyroidism, unspecified: Secondary | ICD-10-CM | POA: Diagnosis not present

## 2018-09-03 DIAGNOSIS — I340 Nonrheumatic mitral (valve) insufficiency: Secondary | ICD-10-CM | POA: Diagnosis not present

## 2018-09-03 DIAGNOSIS — Z79899 Other long term (current) drug therapy: Secondary | ICD-10-CM | POA: Diagnosis not present

## 2018-09-03 DIAGNOSIS — F329 Major depressive disorder, single episode, unspecified: Secondary | ICD-10-CM | POA: Diagnosis not present

## 2018-09-03 DIAGNOSIS — R260 Ataxic gait: Secondary | ICD-10-CM | POA: Diagnosis not present

## 2018-09-03 DIAGNOSIS — R2681 Unsteadiness on feet: Secondary | ICD-10-CM | POA: Diagnosis not present

## 2018-09-03 DIAGNOSIS — E1165 Type 2 diabetes mellitus with hyperglycemia: Secondary | ICD-10-CM | POA: Diagnosis not present

## 2018-09-03 DIAGNOSIS — Z743 Need for continuous supervision: Secondary | ICD-10-CM | POA: Diagnosis not present

## 2018-09-03 DIAGNOSIS — E119 Type 2 diabetes mellitus without complications: Secondary | ICD-10-CM | POA: Diagnosis not present

## 2018-09-03 DIAGNOSIS — R2689 Other abnormalities of gait and mobility: Secondary | ICD-10-CM | POA: Diagnosis not present

## 2018-09-03 DIAGNOSIS — R0989 Other specified symptoms and signs involving the circulatory and respiratory systems: Secondary | ICD-10-CM | POA: Diagnosis not present

## 2018-09-03 DIAGNOSIS — I160 Hypertensive urgency: Secondary | ICD-10-CM | POA: Diagnosis not present

## 2018-09-03 DIAGNOSIS — I639 Cerebral infarction, unspecified: Secondary | ICD-10-CM | POA: Diagnosis not present

## 2018-09-03 DIAGNOSIS — Z87891 Personal history of nicotine dependence: Secondary | ICD-10-CM | POA: Diagnosis not present

## 2018-09-03 DIAGNOSIS — I63531 Cerebral infarction due to unspecified occlusion or stenosis of right posterior cerebral artery: Secondary | ICD-10-CM | POA: Diagnosis not present

## 2018-09-03 DIAGNOSIS — E860 Dehydration: Secondary | ICD-10-CM | POA: Diagnosis not present

## 2018-09-03 DIAGNOSIS — Z853 Personal history of malignant neoplasm of breast: Secondary | ICD-10-CM | POA: Diagnosis not present

## 2018-09-03 DIAGNOSIS — N390 Urinary tract infection, site not specified: Secondary | ICD-10-CM | POA: Diagnosis not present

## 2018-09-03 DIAGNOSIS — R41841 Cognitive communication deficit: Secondary | ICD-10-CM | POA: Diagnosis not present

## 2018-09-03 DIAGNOSIS — F419 Anxiety disorder, unspecified: Secondary | ICD-10-CM | POA: Diagnosis not present

## 2018-09-03 DIAGNOSIS — R279 Unspecified lack of coordination: Secondary | ICD-10-CM | POA: Diagnosis not present

## 2018-09-03 DIAGNOSIS — Z741 Need for assistance with personal care: Secondary | ICD-10-CM | POA: Diagnosis not present

## 2018-09-03 DIAGNOSIS — E876 Hypokalemia: Secondary | ICD-10-CM | POA: Diagnosis not present

## 2018-09-03 DIAGNOSIS — R9431 Abnormal electrocardiogram [ECG] [EKG]: Secondary | ICD-10-CM | POA: Diagnosis not present

## 2018-09-03 DIAGNOSIS — R112 Nausea with vomiting, unspecified: Secondary | ICD-10-CM | POA: Diagnosis not present

## 2018-09-03 DIAGNOSIS — Z794 Long term (current) use of insulin: Secondary | ICD-10-CM | POA: Diagnosis not present

## 2018-09-06 DIAGNOSIS — I160 Hypertensive urgency: Secondary | ICD-10-CM

## 2018-09-08 DIAGNOSIS — I6930 Unspecified sequelae of cerebral infarction: Secondary | ICD-10-CM | POA: Diagnosis not present

## 2018-09-08 DIAGNOSIS — F419 Anxiety disorder, unspecified: Secondary | ICD-10-CM | POA: Diagnosis not present

## 2018-09-08 DIAGNOSIS — E119 Type 2 diabetes mellitus without complications: Secondary | ICD-10-CM | POA: Diagnosis not present

## 2018-09-08 DIAGNOSIS — Z741 Need for assistance with personal care: Secondary | ICD-10-CM | POA: Diagnosis not present

## 2018-09-08 DIAGNOSIS — Z743 Need for continuous supervision: Secondary | ICD-10-CM | POA: Diagnosis not present

## 2018-09-08 DIAGNOSIS — E1165 Type 2 diabetes mellitus with hyperglycemia: Secondary | ICD-10-CM | POA: Diagnosis not present

## 2018-09-08 DIAGNOSIS — I63541 Cerebral infarction due to unspecified occlusion or stenosis of right cerebellar artery: Secondary | ICD-10-CM | POA: Diagnosis not present

## 2018-09-08 DIAGNOSIS — I10 Essential (primary) hypertension: Secondary | ICD-10-CM | POA: Diagnosis not present

## 2018-09-08 DIAGNOSIS — E785 Hyperlipidemia, unspecified: Secondary | ICD-10-CM | POA: Diagnosis not present

## 2018-09-08 DIAGNOSIS — F329 Major depressive disorder, single episode, unspecified: Secondary | ICD-10-CM | POA: Diagnosis not present

## 2018-09-08 DIAGNOSIS — R2689 Other abnormalities of gait and mobility: Secondary | ICD-10-CM | POA: Diagnosis not present

## 2018-09-08 DIAGNOSIS — M6281 Muscle weakness (generalized): Secondary | ICD-10-CM | POA: Diagnosis not present

## 2018-09-08 DIAGNOSIS — R41841 Cognitive communication deficit: Secondary | ICD-10-CM | POA: Diagnosis not present

## 2018-09-08 DIAGNOSIS — R2681 Unsteadiness on feet: Secondary | ICD-10-CM | POA: Diagnosis not present

## 2018-09-08 DIAGNOSIS — E039 Hypothyroidism, unspecified: Secondary | ICD-10-CM | POA: Diagnosis not present

## 2018-09-08 DIAGNOSIS — E1159 Type 2 diabetes mellitus with other circulatory complications: Secondary | ICD-10-CM | POA: Diagnosis not present

## 2018-09-08 DIAGNOSIS — R278 Other lack of coordination: Secondary | ICD-10-CM | POA: Diagnosis not present

## 2018-09-08 DIAGNOSIS — R269 Unspecified abnormalities of gait and mobility: Secondary | ICD-10-CM | POA: Diagnosis not present

## 2018-09-08 DIAGNOSIS — R279 Unspecified lack of coordination: Secondary | ICD-10-CM | POA: Diagnosis not present

## 2018-09-08 DIAGNOSIS — I63531 Cerebral infarction due to unspecified occlusion or stenosis of right posterior cerebral artery: Secondary | ICD-10-CM | POA: Diagnosis not present

## 2018-09-11 DIAGNOSIS — I6930 Unspecified sequelae of cerebral infarction: Secondary | ICD-10-CM | POA: Diagnosis not present

## 2018-09-11 DIAGNOSIS — R2681 Unsteadiness on feet: Secondary | ICD-10-CM | POA: Diagnosis not present

## 2018-09-11 DIAGNOSIS — E1159 Type 2 diabetes mellitus with other circulatory complications: Secondary | ICD-10-CM | POA: Diagnosis not present

## 2018-09-11 DIAGNOSIS — I10 Essential (primary) hypertension: Secondary | ICD-10-CM | POA: Diagnosis not present

## 2018-09-14 ENCOUNTER — Other Ambulatory Visit: Payer: Self-pay

## 2018-09-14 ENCOUNTER — Ambulatory Visit (INDEPENDENT_AMBULATORY_CARE_PROVIDER_SITE_OTHER): Payer: PPO | Admitting: Neurology

## 2018-09-14 ENCOUNTER — Encounter: Payer: Self-pay | Admitting: Neurology

## 2018-09-14 VITALS — BP 167/74 | HR 91 | Resp 18 | Ht 63.0 in | Wt 135.0 lb

## 2018-09-14 DIAGNOSIS — I63541 Cerebral infarction due to unspecified occlusion or stenosis of right cerebellar artery: Secondary | ICD-10-CM | POA: Diagnosis not present

## 2018-09-14 DIAGNOSIS — R269 Unspecified abnormalities of gait and mobility: Secondary | ICD-10-CM | POA: Diagnosis not present

## 2018-09-14 MED ORDER — ASPIRIN 81 MG PO TABS: 81.0000 mg | ORAL_TABLET | Freq: Every day | ORAL | 1 refills | Status: AC

## 2018-09-14 NOTE — Patient Instructions (Signed)
I had a long d/w patient as well as her daughter [name] whom I spoke to over the phone about her recent right cerebellar stroke, risk for recurrent stroke/TIAs, personally independently reviewed imaging studies and stroke evaluation results and answered questions.discontinue Xarelto and continue Plavix 75 mg daily and aspirin 81 mg daily for 2 more months to be followed by aspirin 81 mg alone for secondary stroke prevention and maintain strict control of hypertension with blood pressure goal below 130/90, diabetes with hemoglobin A1c goal below 6.5% and lipids with LDL cholesterol goal below 70 mg/dL. I also advised the patient to eat a healthy diet with plenty of whole grains, cereals, fruits and vegetables, exercise regularly and maintain ideal body weight .Continue ongoing physical and occupational therapy and she was counseled to use a walker at all times to avoid falls and injuries.  Followup in the future with me only as necessary  Stroke Prevention Some medical conditions and behaviors are associated with a higher chance of having a stroke. You can help prevent a stroke by making nutrition, lifestyle, and other changes, including managing any medical conditions you may have. What nutrition changes can be made?   Eat healthy foods. You can do this by: ? Choosing foods high in fiber, such as fresh fruits and vegetables and whole grains. ? Eating at least 5 or more servings of fruits and vegetables a day. Try to fill half of your plate at each meal with fruits and vegetables. ? Choosing lean protein foods, such as lean cuts of meat, poultry without skin, fish, tofu, beans, and nuts. ? Eating low-fat dairy products. ? Avoiding foods that are high in salt (sodium). This can help lower blood pressure. ? Avoiding foods that have saturated fat, trans fat, and cholesterol. This can help prevent high cholesterol. ? Avoiding processed and premade foods.  Follow your health care provider's specific  guidelines for losing weight, controlling high blood pressure (hypertension), lowering high cholesterol, and managing diabetes. These may include: ? Reducing your daily calorie intake. ? Limiting your daily sodium intake to 1,500 milligrams (mg). ? Using only healthy fats for cooking, such as olive oil, canola oil, or sunflower oil. ? Counting your daily carbohydrate intake. What lifestyle changes can be made?  Maintain a healthy weight. Talk to your health care provider about your ideal weight.  Get at least 30 minutes of moderate physical activity at least 5 days a week. Moderate activity includes brisk walking, biking, and swimming.  Do not use any products that contain nicotine or tobacco, such as cigarettes and e-cigarettes. If you need help quitting, ask your health care provider. It may also be helpful to avoid exposure to secondhand smoke.  Limit alcohol intake to no more than 1 drink a day for nonpregnant women and 2 drinks a day for men. One drink equals 12 oz of beer, 5 oz of wine, or 1 oz of hard liquor.  Stop any illegal drug use.  Avoid taking birth control pills. Talk to your health care provider about the risks of taking birth control pills if: ? You are over 50 years old. ? You smoke. ? You get migraines. ? You have ever had a blood clot. What other changes can be made?  Manage your cholesterol levels. ? Eating a healthy diet is important for preventing high cholesterol. If cholesterol cannot be managed through diet alone, you may also need to take medicines. ? Take any prescribed medicines to control your cholesterol as told by your health  care provider.  Manage your diabetes. ? Eating a healthy diet and exercising regularly are important parts of managing your blood sugar. If your blood sugar cannot be managed through diet and exercise, you may need to take medicines. ? Take any prescribed medicines to control your diabetes as told by your health care  provider.  Control your hypertension. ? To reduce your risk of stroke, try to keep your blood pressure below 130/80. ? Eating a healthy diet and exercising regularly are an important part of controlling your blood pressure. If your blood pressure cannot be managed through diet and exercise, you may need to take medicines. ? Take any prescribed medicines to control hypertension as told by your health care provider. ? Ask your health care provider if you should monitor your blood pressure at home. ? Have your blood pressure checked every year, even if your blood pressure is normal. Blood pressure increases with age and some medical conditions.  Get evaluated for sleep disorders (sleep apnea). Talk to your health care provider about getting a sleep evaluation if you snore a lot or have excessive sleepiness.  Take over-the-counter and prescription medicines only as told by your health care provider. Aspirin or blood thinners (antiplatelets or anticoagulants) may be recommended to reduce your risk of forming blood clots that can lead to stroke.  Make sure that any other medical conditions you have, such as atrial fibrillation or atherosclerosis, are managed. What are the warning signs of a stroke? The warning signs of a stroke can be easily remembered as BEFAST.  B is for balance. Signs include: ? Dizziness. ? Loss of balance or coordination. ? Sudden trouble walking.  E is for eyes. Signs include: ? A sudden change in vision. ? Trouble seeing.  F is for face. Signs include: ? Sudden weakness or numbness of the face. ? The face or eyelid drooping to one side.  A is for arms. Signs include: ? Sudden weakness or numbness of the arm, usually on one side of the body.  S is for speech. Signs include: ? Trouble speaking (aphasia). ? Trouble understanding.  T is for time. ? These symptoms may represent a serious problem that is an emergency. Do not wait to see if the symptoms will go away.  Get medical help right away. Call your local emergency services (911 in the [place] Do not drive yourself to the hospital.  Other signs of stroke may include: ? A sudden, severe headache with no known cause. ? Nausea or vomiting. ? Seizure. Where to find more information For more information, visit:  American Stroke Association: www.strokeassociation.org  National Stroke Association: www.stroke.org Summary  You can prevent a stroke by eating healthy, exercising, not smoking, limiting alcohol intake, and managing any medical conditions you may have.  Do not use any products that contain nicotine or tobacco, such as cigarettes and e-cigarettes. If you need help quitting, ask your health care provider. It may also be helpful to avoid exposure to secondhand smoke.  Remember BEFAST for warning signs of stroke. Get help right away if you or a loved one has any of these signs. This information is not intended to replace advice given to you by your health care provider. Make sure you discuss any questions you have with your health care provider. Document Released: 08/29/2004 Document Revised: 08/27/2016 Document Reviewed: 08/27/2016 Elsevier Interactive Patient Education  2019 [employer].

## 2018-09-14 NOTE — Progress Notes (Signed)
[facility] [address] [place]. [place] [address] [id number]       OFFICE CONSULT NOTE  Amber Ballard Date of Birth:  1932/03/12 Medical Record Number:  [id number]   Referring MD:  Bernadette Hoit Reason for Referral: Stroke HPI: Amber Ballard is a 85 year old pleasant Caucasian lady seen today for initial office consultation visit for stroke.  History is obtained from the patient and referral notes and I have personally reviewed electronic medical records and available imaging films in PACS.  I also spoke to the patient's daughter Amber Ballard at [phone] and obtain additional history and discuss plan of care with her.  Amber Ballard is a 85 year old lady with past medical history of hypertension, hyperlipidemia, diabetes, hypothyroidism, anxiety/depression and UTIs.  She developed sudden onset of dizziness, unsteady gait, imbalance and nausea and vomiting on 09/02/2017.  She was taken to [facility] where his CT scan of the head was unremarkable and only showed age-related changes of atrophy and small vessel disease.  Subsequently MRI scan of the brain on 09/03/2018 showed acute right posterior inferior cerebellar territory infarct involving right inferior cerebellum.  There was no associated hemorrhage or mass-effect or hydrocephalus noted.  Carotid ultrasound showed no significant extracranial stenosis.  I do not have access to additional records to know whether intracranial vascular imaging or echocardiogram and cardiac monitoring results were obtained.  The patient was transferred for rehabilitation and is presently in a nursing home.  She states she is obtain improvement.  She is now able to walk with a walker and is getting physical occupational therapy.  She has had no falls or injury.  She feels her vision and nausea is much better.  She was started on Xarelto for unclear reason.  When I spoke with the patient's daughter she was not aware of any deep vein  thrombosis, pulmonary embolism or diagnosis of atrial fibrillation.  It is unclear to me whether Xarelto is currently been continued or discontinued.  Attempts to reach the nursing home staff were not successful.  Denies any prior history of strokes TIAs atrial fibrillation cardiac issues.  She does have significant vascular risk factors of diabetes, hypertension and hyperlipidemia.  She states she is apparently well controlled.  I do not have any recent lipid profile or hemoglobin A1c labs.  ROS:   14 system review of systems is positive for ringing in the ears, decreased hearing, gait difficulty, imbalance, memory loss and all other systems negative  PMH:  Past Medical History:  Diagnosis Date  . Anxiety   . Arthritis   . Diabetes mellitus   . Hypertension   . Hypothyroidism   . PONV (postoperative nausea and vomiting)   . Stroke ([place])   . Wears glasses     Social History:  Social History   Socioeconomic History  . Marital status: Widowed    Spouse name: Amber Ballard  . Number of children: 1  . Years of education: 17  . Highest education level: 12th grade  Occupational History  . Occupation: Retired Conservation officer, historic buildings  . Financial resource strain: Not on file  . Food insecurity:    Worry: Not on file    Inability: Not on file  . Transportation needs:    Medical: Not on file    Non-medical: Not on file  Tobacco Use  . Smoking status: Former Smoker    Last attempt to quit: 03/24/2010    Years since quitting: 8.4  . Smokeless tobacco: Former  User  Substance and Sexual Activity  . Alcohol use: No  . Drug use: No  . Sexual activity: Not on file  Lifestyle  . Physical activity:    Days per week: Not on file    Minutes per session: Not on file  . Stress: Not on file  Relationships  . Social connections:    Talks on phone: Not on file    Gets together: Not on file    Attends religious service: Not on file    Active member of club or organization: Not on file     Attends meetings of clubs or organizations: Not on file    Relationship status: Not on file  . Intimate partner violence:    Fear of current or ex partner: Not on file    Emotionally abused: Not on file    Physically abused: Not on file    Forced sexual activity: Not on file  Other Topics Concern  . Not on file  Social History Narrative  . Not on file    Medications:   Current Outpatient Medications on File Prior to Visit  Medication Sig Dispense Refill  . amLODipine (NORVASC) 10 MG tablet     . atorvastatin (LIPITOR) 80 MG tablet     . clopidogrel (PLAVIX) 75 MG tablet Take 75 mg by mouth daily.    . furosemide (LASIX) 40 MG tablet     . hydrALAZINE (APRESOLINE) 100 MG tablet Take 100 mg by mouth every 8 (eight) hours.    . insulin aspart (NOVOLOG) 100 UNIT/ML injection Inject 5-10 Units into the skin 3 (three) times daily before meals. Sliding scale    . insulin detemir (LEVEMIR) 100 UNIT/ML injection Inject 15 Units into the skin at bedtime. 10 mL 0  . levothyroxine (SYNTHROID, LEVOTHROID) 75 MCG tablet     . losartan (COZAAR) 100 MG tablet     . Magnesium 400 MG TABS Take 400 mg by mouth 2 (two) times daily.    . meclizine (ANTIVERT) 25 MG tablet Take 25 mg by mouth 3 (three) times daily as needed for dizziness.    . senna (SENOKOT) 8.6 MG TABS Take 2 tablets (17.2 mg total) by mouth 2 (two) times daily. 120 each   . vitamin B-12 (CYANOCOBALAMIN) 1000 MCG tablet Take 1,000 mcg by mouth daily.    Marland Kitchen ALPRAZolam (XANAX) 0.25 MG tablet Take 1 tablet (0.25 mg total) by mouth at bedtime as needed. For sleep (Patient not taking: Reported on 09/14/2018) 30 tablet 0  . Calcium Polycarbophil (FIBER LAXATIVE PO) Take 1 tablet by mouth daily.    . citalopram (CELEXA) 20 MG tablet Take 20 mg by mouth daily.    Marland Kitchen dextrose (GLUTOSE) 40 % GEL Take 37.5 g by mouth as needed (CBG less than 70 or CBG greater than 70 and next meal is more than 1 hours away). (Patient not taking: Reported on 09/14/2018)  1 Tube 0  . hydrochlorothiazide (HYDRODIURIL) 25 MG tablet Take 12.5 mg by mouth daily.    . insulin aspart (NOVOLOG) 100 UNIT/ML injection Inject 0-9 Units into the skin 3 (three) times daily with meals. 1 vial 0  . insulin aspart (NOVOLOG) 100 UNIT/ML injection Inject 3 Units into the skin 3 (three) times daily with meals. 1 vial 0  . promethazine (PHENERGAN) 25 MG tablet Take 25 mg by mouth every 6 (six) hours as needed. For nausea     No current facility-administered medications on file prior to visit.  Allergies:  No Known Allergies  Physical Exam General: Frail elderly Caucasian lady, seated, in no evident distress Head: head normocephalic and atraumatic.   Neck: supple with no carotid or supraclavicular bruits Cardiovascular: regular rate and rhythm, no murmurs Musculoskeletal: no deformity except mild kyphoscoliosis Skin:  no rash/petichiae Vascular:  Normal pulses all extremities  Neurologic Exam Mental Status: Awake and fully alert. Oriented to place and time. Recent and remote memory diminished. Attention span, concentration and fund of knowledge poor. Mood and affect appropriate.  Diminished recall 1/3.  Poor short-term memory. Cranial Nerves: Fundoscopic exam reveals sharp disc margins. Pupils equal, briskly reactive to light. Extraocular movements full without nystagmus. Visual fields full to confrontation. Hearing diminished bilaterally. Facial sensation intact. Face, tongue, palate moves normally and symmetrically.  Motor: Normal bulk and tone. Normal strength in all tested extremity muscles. Sensory.: intact to touch , pinprick , position and vibratory sensation.  Coordination: Rapid alternating movements normal in all extremities. Finger-to-nose and heel-to-shin performed accurately bilaterally. Gait and Station: Arises from chair with t difficulty. Stance is wide-based. Gait demonstrates ataxia and mild imbalance and uses a walker. Able to heel, toe and tandem walk  without difficulty.  Reflexes: 1+ and symmetric. Toes downgoing.   NIHSS  1 Modified Rankin  3  ASSESSMENT: 85 year old Caucasian lady with right cerebellar stroke in January 2019 etiology unclear as to large vessel disease versus embolic.  Vascular risk factors of diabetes, hypertension, hyperlipidemia and age     PLAN: I had a long d/w patient as well as her daughter [name] whom I spoke to over the phone about her recent right cerebellar stroke, risk for recurrent stroke/TIAs, personally independently reviewed imaging studies and stroke evaluation results and answered questions.discontinue Xarelto and continue Plavix 75 mg daily and aspirin 81 mg daily for 2 more months to be followed by aspirin 81 mg alone for secondary stroke prevention and maintain strict control of hypertension with blood pressure goal below 130/90, diabetes with hemoglobin A1c goal below 6.5% and lipids with LDL cholesterol goal below 70 mg/dL. I also advised the patient to eat a healthy diet with plenty of whole grains, cereals, fruits and vegetables, exercise regularly and maintain ideal body weight .Continue ongoing physical and occupational therapy and she was counseled to use a walker at all times to avoid falls and injuries.  Greater than 50% time during this 45-minute consultation visit was spent on counseling and coordination of care about her stroke and answering questions followup in the future with me only as needed  Antony Contras, MD   09/14/2018 6:35 PM  Note: This document was prepared with digital dictation and possible smart phrase technology. Any transcriptional errors that result from this process are unintentional.

## 2018-09-15 DIAGNOSIS — E1159 Type 2 diabetes mellitus with other circulatory complications: Secondary | ICD-10-CM | POA: Diagnosis not present

## 2018-09-15 DIAGNOSIS — I6930 Unspecified sequelae of cerebral infarction: Secondary | ICD-10-CM | POA: Diagnosis not present

## 2018-09-15 DIAGNOSIS — R2681 Unsteadiness on feet: Secondary | ICD-10-CM | POA: Diagnosis not present

## 2018-09-15 DIAGNOSIS — I10 Essential (primary) hypertension: Secondary | ICD-10-CM | POA: Diagnosis not present

## 2018-09-21 ENCOUNTER — Other Ambulatory Visit: Payer: Self-pay | Admitting: *Deleted

## 2018-09-21 NOTE — Patient Outreach (Signed)
[facility]) Care Management  09/21/2018  Art[place] Berkey 08/19/1931 [id number]   Facility site visit to [facility]. Collaboration with Arbie Cookey facility rep concerning patient's progress, discharge plan and potential care management needs. Patient admitted to SNF on 09/08/18 after a hospitalization for CVA. Planned discharge date is not set  and discharge disposition is to return home where she lives with her daughter and son in law.  Patient will have [place] at discharge. Patient was evaluated for community based chronic disease management services with [facility] care Management Program as a benefit of patient's [employer] Medicare.   Went to patient's bedside to speak with patient and further assess for care management needs.  Patient was sitting in the hallway in a wheelchair. Patient stated she has been doing well in therapy and feels she is getting stronger.   Patient states her main support person is her daughter [name], who she lives with. Patient endorses their primary care provider to be Dr. Micheal Likens. Patient states transportation needs will be provided by her daughter. Patient states medication management provided by her daughter.  Patient discussed medication cost is not currently a barrier to care. Introduced Psychologist, forensic and gave patient [facility] Patient Packet with my contact information included.  Patient agreed to [place] services.  Patient gave [phone] her daughter's contact as the best number to reach them. Patient also gave verbal permission to speak with her daughter [name].   Referral placed for [place] to engage patient for transition of care and evaluate for monthly home visits.   [facility] Care Management services do not interfere with or replace any services arranged by the facility discharge planner.   Plan to make [facility] UM team member aware [facility] will be  following for care management.   Plan to follow patient's progress until SNF discharge and make [facility] RNCM aware of discharge.  For additional questions please contact:   Heinrich Fertig RN, [facility] Liaison  [phone]) Business Mobile [phone]) Toll free office

## 2018-09-25 DIAGNOSIS — I6930 Unspecified sequelae of cerebral infarction: Secondary | ICD-10-CM | POA: Diagnosis not present

## 2018-09-25 DIAGNOSIS — R2681 Unsteadiness on feet: Secondary | ICD-10-CM | POA: Diagnosis not present

## 2018-09-25 DIAGNOSIS — I10 Essential (primary) hypertension: Secondary | ICD-10-CM | POA: Diagnosis not present

## 2018-09-25 DIAGNOSIS — E1159 Type 2 diabetes mellitus with other circulatory complications: Secondary | ICD-10-CM | POA: Diagnosis not present

## 2018-10-06 DIAGNOSIS — I10 Essential (primary) hypertension: Secondary | ICD-10-CM | POA: Diagnosis not present

## 2018-10-06 DIAGNOSIS — I50810 Right heart failure, unspecified: Secondary | ICD-10-CM | POA: Diagnosis not present

## 2018-10-06 DIAGNOSIS — E118 Type 2 diabetes mellitus with unspecified complications: Secondary | ICD-10-CM | POA: Diagnosis not present

## 2018-10-06 DIAGNOSIS — E785 Hyperlipidemia, unspecified: Secondary | ICD-10-CM | POA: Diagnosis not present

## 2018-10-06 DIAGNOSIS — R262 Difficulty in walking, not elsewhere classified: Secondary | ICD-10-CM | POA: Diagnosis not present

## 2018-10-06 DIAGNOSIS — E039 Hypothyroidism, unspecified: Secondary | ICD-10-CM | POA: Diagnosis not present

## 2018-10-06 DIAGNOSIS — E559 Vitamin D deficiency, unspecified: Secondary | ICD-10-CM | POA: Diagnosis not present

## 2018-10-06 DIAGNOSIS — Z8673 Personal history of transient ischemic attack (TIA), and cerebral infarction without residual deficits: Secondary | ICD-10-CM | POA: Diagnosis not present

## 2018-10-06 DIAGNOSIS — Z79899 Other long term (current) drug therapy: Secondary | ICD-10-CM | POA: Diagnosis not present

## 2018-10-06 DIAGNOSIS — Z6824 Body mass index (BMI) 24.0-24.9, adult: Secondary | ICD-10-CM | POA: Diagnosis not present

## 2018-10-09 ENCOUNTER — Other Ambulatory Visit: Payer: Self-pay | Admitting: *Deleted

## 2018-10-09 NOTE — Patient Outreach (Signed)
[facility]) Care Management  10/09/2018  Amber Ballard 1931/11/08 [id number]  Transition of care call   Referral from : [name]  Referral reason: Patient with discharge from [name]  Dx: Admission to [name] 1/30 - 2/4, Right Cerebellar CVA.   Chart reviewed for PMHX : that includes but not limited to , Diabetes( A1c 11.5  on 07/23/18)  hypertension , hyperlipidemia   Outreach call to patient daughter [name] as patient has stated is the  best contact  [phone] ) to reach them and patient  has given verbal agreement to speak with her daughter as  communicated by [name] post acute care coordinator.      Unsuccessful outreach call to preferred number, no answer able to leave a HIPAA compliant message for return call.      Plan Will send unsuccessful outreach letter  Will update [name] assigned care coordinator     [name], RN, [place] Management Coordinator  [phone]- Mobile [phone]- Toll Free Main Office

## 2018-10-12 ENCOUNTER — Other Ambulatory Visit: Payer: Self-pay

## 2018-10-12 NOTE — Patient Outreach (Signed)
Telephone outreach:  Placed call to patient and spoke with daughter, [name] who is contact. Mrs. York states that patient is doing well. Reports she is taking care of her mom. Reviewed [facility] program an benefit of insurance. Daughter reports that patient saw primary MD last week on 10/06/2018. Daughter reports that patient is walking with walker, reports BP is doing well.  Also daughter report no home health at this time.   DM: daughter reports problems getting patient to follow DM diet. Daughter reports difficulty knowing when to hold levemir and when to give levemir based on CBG readings. Also states that patient is sneaking foods. ( pie and honey buns)  Currently patient is living with daughter at  [place] [place].  PLAN: Initial home visit to daughters home on 10/14/2018 at 10 am.  [name], RN, BSN, [facility] [place] Coordinator [phone]

## 2018-10-14 ENCOUNTER — Other Ambulatory Visit: Payer: Self-pay

## 2018-10-14 NOTE — Patient Outreach (Cosign Needed)
[facility]) Care Management   10/14/2018  Amber Ballard [birth year] [id number]  Amber Ballard is an 85 y.o. female Initial home visit with daughter [name] present . STROKE 08/28/2018.  SubjectiIve:Pateint reports that she was driving home and was "all over the road" . Reports headache the day before. ( states she never has headaches). Patient reports that when she parked the car she started vomiting. Daughter called 911.  Patient found to be hypertensive 200/105 and having stroke symptoms. Left sided facial drop and right sided weakness. Went to [facility] for rehab and has been home [facility] 09/29/2018.  Denies any home health. Patient reports that she is doing well. Reports her daughter is taking care of her. Reports the main concern is about insulin. Reports CBG up and down. Daughter states that she does not know when to give or hold the levemir. Reports patient likes to sneak foods.  Patient reports that she does not like salt substitute. Patient reports she has altered balance and is now walking with a walker.  CBG range in the last 24 hours 97-402.  Objective:  Awake and alert. Walking well with walker.  Today's Vitals   10/14/18 1025 10/14/18 1028  BP: (!) 130/54   Pulse: 63   Resp: 16   SpO2: 98%   Weight: 138 lb (62.6 kg)   Height: 1.575 m (5\' 2" )   PainSc:  0-No pain   Review of Systems  Constitutional: Positive for malaise/fatigue.  HENT: Positive for hearing loss.   Eyes:       Reports not being able to see as well  Respiratory: Negative.   Cardiovascular: Negative.   Gastrointestinal: Negative.   Genitourinary: Negative.   Musculoskeletal: Negative.   Skin:       dry  Neurological: Positive for weakness.       Reports weakness on right side  Endo/Heme/Allergies: Bruises/bleeds easily.  Psychiatric/Behavioral: The patient has insomnia.     Physical Exam  Constitutional: She is oriented to person, place, and time. She appears well-developed and  well-nourished.  HENT:  Noted left upper lip with drooping.  Daughter states this is the result of stroke.  Cardiovascular: Normal rate and regular rhythm.  Respiratory: Effort normal and breath sounds normal.  GI: Soft. Bowel sounds are normal.  Musculoskeletal: Normal range of motion.        General: No edema.  Neurological: She is alert and oriented to person, place, and time.  Grips equal and strong.   Skin: Skin is warm and dry.  Psychiatric: She has a normal mood and affect. Her behavior is normal. Judgment and thought content normal.    Encounter Medications:   Outpatient Encounter Medications as of 10/14/2018  Medication Sig Note  . amLODipine (NORVASC) 10 MG tablet    . aspirin 81 MG tablet Take 1 tablet (81 mg total) by mouth daily.   Marland Kitchen atorvastatin (LIPITOR) 80 MG tablet    . Calcium Polycarbophil (FIBER LAXATIVE PO) Take 1 tablet by mouth daily.   . clopidogrel (PLAVIX) 75 MG tablet Take 75 mg by mouth daily. 09/14/2018: Pt on plavix per [place]   . furosemide (LASIX) 40 MG tablet    . hydrALAZINE (APRESOLINE) 100 MG tablet Take 100 mg by mouth every 8 (eight) hours.   . insulin detemir (LEVEMIR) 100 UNIT/ML injection Inject 15 Units into the skin at bedtime. (Patient taking differently: Inject 16 Units into the skin at bedtime. )   . levothyroxine (SYNTHROID, LEVOTHROID) 75 MCG tablet    .  losartan (COZAAR) 100 MG tablet    . Magnesium 400 MG TABS Take 400 mg by mouth 2 (two) times daily.   . metoprolol tartrate (LOPRESSOR) 50 MG tablet Take 50 mg by mouth 2 (two) times daily.   . potassium chloride (K-DUR,KLOR-CON) 10 MEQ tablet Take 10 mEq by mouth daily at 3 pm.   . senna (SENOKOT) 8.6 MG TABS Take 2 tablets (17.2 mg total) by mouth 2 (two) times daily.   . vitamin B-12 (CYANOCOBALAMIN) 1000 MCG tablet Take 1,000 mcg by mouth daily.   . Vitamin D, Ergocalciferol, (DRISDOL) 1.25 MG (50000 UT) CAPS capsule Take 50,000 Units by mouth every 7 (seven) days.   .  citalopram (CELEXA) 20 MG tablet Take 20 mg by mouth daily.   . insulin aspart (NOVOLOG) 100 UNIT/ML injection Inject 0-9 Units into the skin 3 (three) times daily with meals. (Patient taking differently: Inject 0-14 Units into the skin 3 (three) times daily with meals. )   . insulin aspart (NOVOLOG) 100 UNIT/ML injection Inject 3 Units into the skin 3 (three) times daily with meals.   . meclizine (ANTIVERT) 25 MG tablet Take 25 mg by mouth 3 (three) times daily as needed for dizziness.   . [DISCONTINUED] ALPRAZolam (XANAX) 0.25 MG tablet Take 1 tablet (0.25 mg total) by mouth at bedtime as needed. For sleep (Patient not taking: Reported on 09/14/2018)   . [DISCONTINUED] dextrose (GLUTOSE) 40 % GEL Take 37.5 g by mouth as needed (CBG less than 70 or CBG greater than 70 and next meal is more than 1 hours away). (Patient not taking: Reported on 09/14/2018)   . [DISCONTINUED] hydrochlorothiazide (HYDRODIURIL) 25 MG tablet Take 12.5 mg by mouth daily.   . [DISCONTINUED] insulin aspart (NOVOLOG) 100 UNIT/ML injection Inject 5-10 Units into the skin 3 (three) times daily before meals. Sliding scale   . [DISCONTINUED] promethazine (PHENERGAN) 25 MG tablet Take 25 mg by mouth every 6 (six) hours as needed. For nausea    No facility-administered encounter medications on file as of 10/14/2018.     Functional Status:   In your present state of health, do you have any difficulty performing the following activities: 10/14/2018  Hearing? Y  Vision? Y  Difficulty concentrating or making decisions? N  Walking or climbing stairs? Y  Dressing or bathing? N  Doing errands, shopping? Y  Preparing Food and eating ? N  Using the Toilet? N  In the past six months, have you accidently leaked urine? N  Do you have problems with loss of bowel control? N  Managing your Medications? Y  Comment concerned about when to take insulin  Managing your Finances? N  Housekeeping or managing your Housekeeping? Y  Comment  daughter does house keeping  Some recent data might be hidden    Fall/Depression Screening:    Fall Risk  10/14/2018  Falls in the past year? 0  Number falls in past yr: 0  Risk for fall due to : History of fall(s)  Follow up Falls evaluation completed   PHQ 2/9 Scores 10/14/2018  PHQ - 2 Score 1    Assessment:   (1) reviewed [facility] program. Provided a new patient packet.  Reviewed questions and concerns. Provided 24 hour nurse line magnet and [place] calendar. Provided my contact card. Consent reviewed and signed.   (2) Recent discharged from [facility].  NOTE MD office does own Transition of care. (3)DM:  Daughter unsure when to give insulin and when to hold insulin. Reviewed  diabetic diet. Noted good CBG log. Todays CBG of 97. (4) recent stroke with resulting balance issues and weakness on right side ( per patient report). Fall risk. Grips equal at this time..  Noted left upper lip droop.  (5) Hypertension:  Patient reports history of not taking medications as prescribed and not following low salt diet prior to stroke. (6) daughter concern of need to go back to work and no one to care for mother.  Daughters biggest concern is patient taking her medications as prescribed.  Plan:  (1) consent signed and scanned into chart. Will plan telephone follow up in 2 weeks. (2) Encouraged patient and daughter to make follow up appointment with neurology as suggested. Also encouraged patient to attend follow up with primary MD planned for next week including labs. (3) Reviewed importance of knowing A1c and taking medications as prescribed. Reviewed diet. Will place order for [facility] pharmacy to help daughter understand when to give insulin and when not to give insulin.  (4) reviewed signs and symptoms of new stoke. Reviewed with patient and daughter increase risk of having an additional stroke. Encouraged diet and medication management. Reviewed early recognition of symptoms. Reviewed with patient fall  risk. Encouraged non slip socks and shoes at all times.  Reviewed need to use walker at all times.   (5) Reviewed importance of taking all medications as prescribed. Assisted daughter with setting up new BP monitor.  Reviewed importance of taking BP twice a day and recording readings. Suggested daughter take log to MD appointment next week. Reviewed Epic and noted that Dr. Leonie Man wanted BP target goal was under 130/90.  Encouraged daughter to report abnormal BP to primary MD. Reviewed log salt diet. (6)Will place order for [facility] social worker to assist with in home help or other available options for patient.    This note and barrier letter sent to MD. [facility] CM Care Plan Problem One     Most Recent Value  Care Plan Problem One  Recent stroke  Role Documenting the Problem One  Care Management [place] for Problem One  Active  [facility] Long Term Goal   Patient will report no increase in difficulty managing ADLS and IADLS in the next 60 days.   THN Long Term Goal Start Date  10/14/18  Interventions for Problem One Long Term Goal  Home visit completed. Reviewed signs and symptoms of stroke and when to call 911.   THN CM Short Term Goal #1   Patient will report monitoring BP twice a day and recoording in the [facility] calendar for the next 30 days.   THN CM Short Term Goal #1 Start Date  10/14/18  Interventions for Short Term Goal #1  Demonstrated how to use BP monitor. Reviewed where to record BP daily. Reviewed neurology goal/ target BP.   THN CM Short Term Goal #2   Patient and or daughter will report decrease use of salt in the next 30 days.   THN CM Short Term Goal #2 Start Date  10/14/18  Interventions for Short Term Goal #2  reviewed alternatives for salt like dash and Nu salt. Encouraged patient to try a variety of no sodium options.   THN CM Short Term Goal #3  Patient and or daughter will verbalize understanding of when to take insulin and when to hold insulin in the next 14 days.   THN CM  Short Term Goal #3 Start Date  10/14/18  Interventions for Short Tern Goal #3  Reviewed importance of keeping primary MD appointment. Placed order for [facility] pharmacy to discuss insulin with daughter [name].     [name], RN, BSN, CEN [facility] [employer] [phone]

## 2018-10-19 ENCOUNTER — Other Ambulatory Visit: Payer: Self-pay | Admitting: Pharmacist

## 2018-10-19 NOTE — Patient Outreach (Signed)
[place] [facility]) Care Management  [place]   10/19/2018  Amber Ballard 03-17-1932 [id number]  Reason for referral: Medication Management with insulin Referral source: [facility] RN Current insurance:[employer]  PMHx includes but not limited to:  HTN, HLD, T2DM, hypothyroidism, anxiety / depression, UTIs, recent acute right cerebellar stroke  Unsuccessful telephone call attempt #1 to patient. HIPAA compliant voicemail left requesting a return call with daughter, Amber Ballard.  Patient's home phone not in service.    Plan:  I will make another outreach attempt to patient within 3-4 business days   Ralene Bathe, PharmD, [place] [phone]

## 2018-10-20 ENCOUNTER — Other Ambulatory Visit: Payer: Self-pay

## 2018-10-20 NOTE — Patient Outreach (Signed)
[facility] [facility]) Care Management  10/20/2018  Amber Ballard [birth year] [id number]   Successful outreach to patient's daughter, [name], regarding social work referral for caregiver resources.  Daughter reported that she has been out of work for quite some time and needs to return to work.  She expressed that her biggest concern at this point is patient's inability to manage her medications.  BSW informed her that [place], Ralene Bathe, has attempted to contact her.  BSW provided her with Colleen's contact information and daughter agreed to call.  BSW informed daughter that, without Medicaid, in-home aide would have to be paid for out-of-pocket.  Daughter reported that patient is slightly over the income limit for Medicaid but she requested that an application be sent.  BSW sent Medicaid application via secure email.  BSW also sent list of in-home aide providers  BSW will close case once receipt of resources is confirmed.   Ronn Melena, BSW Social Worker [phone]

## 2018-10-22 ENCOUNTER — Other Ambulatory Visit: Payer: Self-pay | Admitting: Pharmacist

## 2018-10-22 ENCOUNTER — Ambulatory Visit: Payer: Self-pay | Admitting: Pharmacist

## 2018-10-22 DIAGNOSIS — E559 Vitamin D deficiency, unspecified: Secondary | ICD-10-CM | POA: Diagnosis not present

## 2018-10-22 DIAGNOSIS — Z853 Personal history of malignant neoplasm of breast: Secondary | ICD-10-CM | POA: Diagnosis not present

## 2018-10-22 DIAGNOSIS — Z8673 Personal history of transient ischemic attack (TIA), and cerebral infarction without residual deficits: Secondary | ICD-10-CM | POA: Diagnosis not present

## 2018-10-22 DIAGNOSIS — E785 Hyperlipidemia, unspecified: Secondary | ICD-10-CM | POA: Diagnosis not present

## 2018-10-22 DIAGNOSIS — I50810 Right heart failure, unspecified: Secondary | ICD-10-CM | POA: Diagnosis not present

## 2018-10-22 DIAGNOSIS — E039 Hypothyroidism, unspecified: Secondary | ICD-10-CM | POA: Diagnosis not present

## 2018-10-22 DIAGNOSIS — I10 Essential (primary) hypertension: Secondary | ICD-10-CM | POA: Diagnosis not present

## 2018-10-22 DIAGNOSIS — Z6824 Body mass index (BMI) 24.0-24.9, adult: Secondary | ICD-10-CM | POA: Diagnosis not present

## 2018-10-22 DIAGNOSIS — E118 Type 2 diabetes mellitus with unspecified complications: Secondary | ICD-10-CM | POA: Diagnosis not present

## 2018-10-22 NOTE — Patient Outreach (Signed)
[facility] [facility]) Care Management  [place]  10/22/2018  Amber Ballard 1931-12-04 [id number]  Reason for referral: Medication Management with insulin Referral source: [facility] RN Current insurance:[employer]  PMHx includes but not limited to:  HTN, HLD, T2DM, hypothyroidism, anxiety / depression, UTIs, recent acute right cerebellar stroke  Unsuccessful telephone call attempt #2 to patient.   HIPAA compliant voicemail left requesting a return call  Plan:  Will outreach again within 1-2 weeks  Ralene Bathe, PharmD, [place] [phone]

## 2018-10-28 ENCOUNTER — Other Ambulatory Visit: Payer: Self-pay

## 2018-10-29 NOTE — Patient Outreach (Signed)
Telephone assessment:  Place call to patient for 2 week follow up. Daughter states patient is "doing pretty good" . Reports she is now more concern over her own BP now that the daughter is monitoring and keeping a log.  Saw primary MD last week and was provided education again about low salt diet and BP management.  Daughter reports BP is running  145-165/90's.  Reports her balance is about the same. Still using walker for assistance.   Reviewed recent CBG and DM concerns. CBG is higher in the mornings according to daughter. Daughter reports patient sneaks cornbread and is not follow her DM diet very well. A1c 2 weeks ago of 8.0.  Reviewed goal of 6.5 with daughter via phone.   PLAN: encouraged daughter to continue to monitor BP and record. Reviewed importance of following low salt diet.  Reviewed importance of talking with [facility] pharmacy to help with insulin questions. Provided contact phone number for [facility] pharmacist. Encouraged daughter to call pharmacist.   Will plan follow up in 2 weeks via phone.  [name], RN, BSN, CEN [facility] [employer] [phone]

## 2018-10-30 ENCOUNTER — Telehealth: Payer: Self-pay | Admitting: Pharmacist

## 2018-10-30 NOTE — Patient Outreach (Signed)
[place] [facility]) Care Management  10/30/2018  Amber Ballard 1931-09-11 [id number]   Patient's daughter [name]) was called per referral to follow up on insulin questions. Unfortunately, she did not answer the phone. HIPAA compliant message was left for the patient. She was asked to call her assigned pharmacist, Ralene Bathe, PharmD), back on Monday.  Plan: Route note to [place].  Elayne Guerin, PharmD, [facility] Clinical Pharmacist [phone]

## 2018-10-30 NOTE — Telephone Encounter (Signed)
-----   Message from Elayne Guerin, [facility] sent at 10/30/2018  2:24 PM EDT -----  ----- Message ----- From: Thana Ates, RN Sent: 10/29/2018  11:01 AM EDT To: Rudean Haskell, RPH  Please call daughter phone number listed under emergency contact. Daughter needs your help with knowing about insulin.   I spoke with her yesterday.    Its [name]   [phone]  Thanks [name], RN, BSN, CEN [place] Coordinator [phone]

## 2018-11-02 ENCOUNTER — Other Ambulatory Visit: Payer: Self-pay

## 2018-11-03 ENCOUNTER — Other Ambulatory Visit: Payer: Self-pay | Admitting: Pharmacist

## 2018-11-03 NOTE — Patient Outreach (Signed)
[place] [facility]) Care Management  [place]   11/03/2018  Amber Ballard 1932/01/26 [id number]  Reason for referral: Medication Review, Questions from daughter on insulin therapy  Referral source: [facility] RN Current insurance: [employer]age  PMHx includes but not limited to:  HTN, HLD, T2DM, hypothyroidism, anxiety / depression, UTIs, recent acute right cerebellar stroke  Outreach:  Successful telephone call with patient's daughter, [name].  HIPAA identifiers verified.   Subjective:  Daughter reports patient tried pillboxes in the past but was confused.  Daughter has a system in place where she lines up medications for patients and watches her swallow pills.    Does the patient ever forget to take medication?  Yes, when daughter is not at home.   Does the patient have problems obtaining medications due to transportation?   no Does the patient have problems obtaining medications due to cost?  no Does the patient feel that medications prescribed are effective?  yes Does the patient ever experience any side effects to the medications prescribed?  Yes, sometimes low blood sugars  Does the patient measure his/her own blood glucose at home?  Yes Does the patient measure his/her own blood pressure at home? Yes   Objective: Lab Results  Component Value Date   CREATININE 0.76 03/26/2012    No results found for: HGBA1C  Lipid Panel  No results found for: CHOL, TRIG, HDL, CHOLHDL, VLDL, LDLCALC, LDLDIRECT  BP Readings from Last 3 Encounters:  10/14/18 (!) 130/54  09/14/18 (!) 167/74  03/30/12 (!) 126/50    No Known Allergies  Medications Reviewed Today    Reviewed by Rudean Haskell, RPH (Pharmacist) on 11/03/18 at [phone]  Med List Status: <None>  Medication Order Taking? Sig Documenting Provider Last Dose Status Informant  amLODipine (NORVASC) 10 MG tablet [id number] Yes Take 10 mg by mouth daily.  [provider] Taking Active   aspirin 81 MG  tablet [id number] Yes Take 1 tablet (81 mg total) by mouth daily. Garvin Fila, MD Taking Active   atorvastatin (LIPITOR) 80 MG tablet [id number] Yes Take 80 mg by mouth daily at 6 PM.  [provider] Taking Active   Calcium Polycarbophil (FIBER LAXATIVE PO) [id number] Yes Take 1 tablet by mouth daily as needed.  [provider] Taking Active         Discontinued 11/03/18 [phone] (Discontinued by provider)   clopidogrel (PLAVIX) 75 MG tablet [id number] Yes Take 75 mg by mouth daily. [provider] Taking Active            Med Note Iva Lento, [place] Nov 03, 2018  9:53 AM)    furosemide (LASIX) 40 MG tablet [id number] Yes Take 40 mg by mouth daily.  [provider] Taking Active   hydrALAZINE (APRESOLINE) 100 MG tablet [id number] Yes Take 100 mg by mouth every 8 (eight) hours. [provider] Taking Active        Patient taking differently:       Discontinued 97/98/92 1194 (Duplicate)         Discontinued 11/03/18 0957 (Dose change)   insulin aspart (NOVOLOG) 100 UNIT/ML injection [id number] Yes Inject 0-14 Units into the skin 3 (three) times daily with meals. Sliding scale per Dr. Micheal Likens [provider] Taking Active   insulin detemir (LEVEMIR) 100 UNIT/ML injection [id number] Yes Inject 15 Units into the skin at bedtime.  Patient taking differently:  Inject 16 Units into the skin at bedtime.    Wylene Simmer,  MD Taking Active   levothyroxine (SYNTHROID, LEVOTHROID) 75 MCG tablet [id number] Yes Take 75 mcg by mouth daily before breakfast.  [provider] Taking Active   losartan (COZAAR) 100 MG tablet [id number] Yes Take 100 mg by mouth daily.  [provider] Taking Active   Magnesium 400 MG TABS [id number] Yes Take 400 mg by mouth 2 (two) times daily. [provider] Taking Active   meclizine (ANTIVERT) 25 MG tablet [id number] Yes Take 25 mg by mouth 3 (three) times daily as needed for dizziness. [provider] Taking  Active   metoprolol tartrate (LOPRESSOR) 100 MG tablet [id number] Yes Take 100 mg by mouth 2 (two) times daily.  [provider] Taking Active   potassium chloride (K-DUR,KLOR-CON) 10 MEQ tablet [id number] Yes Take 10 mEq by mouth daily at 3 pm. [provider] Taking Active   senna (SENOKOT) 8.6 MG TABS [id number] Yes Take 2 tablets (17.2 mg total) by mouth 2 (two) times daily.  Patient taking differently:  Take 2 tablets by mouth daily as needed.    Wylene Simmer, MD Taking Active   vitamin B-12 (CYANOCOBALAMIN) 1000 MCG tablet [id number] Yes Take 1,000 mcg by mouth daily. [provider] Taking Active   Vitamin D, Ergocalciferol, (DRISDOL) 1.25 MG (50000 UT) CAPS capsule [id number] Yes Take 50,000 Units by mouth every 7 (seven) days. [provider] Taking Active   Med List Note Levin Erp 03/24/12 7741): Takes azor-which is norvasc and benicar          Assessment:  Drugs sorted by system:  Neurologic/Psychologic: meclizine  Cardiovascular: amlodipine, aspirin 81mg , atorvastatin, clopidogrel, furosemide, hydralazine, losartan, metoprolol  Gastrointestinal:fiber laxative, senna  Endocrine: Novolog, Levemir, levothyroxine  Vitamins/Minerals/Supplements: magnesium, potassium, vitamin B-12, vitamin D  Medication Review Findings:  -Per daughter, patient experienced very low CBG (40s) while in nursing home.  Levemir dose reduced from 30 to 16 afterwards.  Daughter still very concerned about the long-acting insulin and often does not give patient Levemir if CBG > 200.  Patient's next appointment with PCP in 11/23/2018.    Counseled patient on short vs long-acting insulin and signs and symptoms of low blood sugar, sources of quick glucose.   Strongly encouraged patient to reach out to Dr.Gage for recommendations on insulin regimen and dosing guidance.  Daughter states she will call office today.   Reviewed importance of recording CBGs along with  food log.  Patient has [facility] calendar book with area to record values.    Medication Adherence Findings: Adherence Review  []  Excellent (no doses missed/week)     [x]  Good (no more than 1 dose missed/week)     []  Partial (2-3 doses missed/week) []  Poor (>3 doses missed/week)  Patient with fair understanding of regimen and fair understanding of indications.    Potential of compliance: excellent as daughter is living at home and gives patient each dose throughout the day.  Daughter reports she may have friend that lives nearby also assist with caring for her mother in the home when she is not able to stay as this is more affordable than hiring a Building services engineer.  Medication Assistance Findings:  No medication assistance needs identified  Plan: . Will route note to Dr. Micheal Likens.  . Will follow-up in 2 weeks with daughter.   Ralene Bathe, PharmD, [place] [phone]

## 2018-11-11 ENCOUNTER — Other Ambulatory Visit: Payer: Self-pay

## 2018-11-17 ENCOUNTER — Other Ambulatory Visit: Payer: Self-pay | Admitting: Pharmacist

## 2018-11-17 ENCOUNTER — Ambulatory Visit: Payer: Self-pay | Admitting: Pharmacist

## 2018-11-17 NOTE — Patient Outreach (Signed)
[place] [facility]) Care Management  [place] 11/17/2018  Amber Ballard Jul 26, 1932 [id number]  Reason for call: f/u on diabetes  Successful call with [name] daughter, [name].  HIPAA identifiers verified.  [name] reports patient's blood sugars are 80s-90s in the morning but by lunch they are in the upper 300s.  She reports SSI for breakfast has 0 units of Novolog if CBG < 200 therefore patient not taking any insulin with breakfast.  Patient has f/u appt next week with Dr. Micheal Likens to discuss adjustments in therapy to target post prandial CBGs.  Daughter will continue to keep track of CBGs and bring her log book to PCP appt.  She states patient is not waking up with any night sweats or having any CBGs < 80.   Plan: F/u again in 2 weeks   Ralene Bathe, PharmD, [place] [phone]

## 2018-11-23 DIAGNOSIS — Z8673 Personal history of transient ischemic attack (TIA), and cerebral infarction without residual deficits: Secondary | ICD-10-CM | POA: Diagnosis not present

## 2018-11-23 DIAGNOSIS — I10 Essential (primary) hypertension: Secondary | ICD-10-CM | POA: Diagnosis not present

## 2018-11-23 DIAGNOSIS — E118 Type 2 diabetes mellitus with unspecified complications: Secondary | ICD-10-CM | POA: Diagnosis not present

## 2018-11-23 DIAGNOSIS — E039 Hypothyroidism, unspecified: Secondary | ICD-10-CM | POA: Diagnosis not present

## 2018-11-23 DIAGNOSIS — I50810 Right heart failure, unspecified: Secondary | ICD-10-CM | POA: Diagnosis not present

## 2018-11-23 DIAGNOSIS — E785 Hyperlipidemia, unspecified: Secondary | ICD-10-CM | POA: Diagnosis not present

## 2018-11-23 DIAGNOSIS — Z853 Personal history of malignant neoplasm of breast: Secondary | ICD-10-CM | POA: Diagnosis not present

## 2018-11-25 ENCOUNTER — Other Ambulatory Visit: Payer: Self-pay

## 2018-11-25 NOTE — Patient Outreach (Signed)
Telephone assessment:  Attempted to reach patient for follow up. No answer. Left message requesting a call back.  PLAN: will attempt again in 24 hours. [name], RN, BSN, CEN [facility] [employer] [phone]

## 2018-11-26 ENCOUNTER — Ambulatory Visit: Payer: Self-pay | Admitting: Pharmacist

## 2018-11-26 ENCOUNTER — Other Ambulatory Visit: Payer: Self-pay | Admitting: Pharmacist

## 2018-11-26 ENCOUNTER — Other Ambulatory Visit: Payer: Self-pay

## 2018-11-26 NOTE — Patient Outreach (Signed)
[facility]) Care Management  [place] 11/26/2018  Lanny Donoso [birth year] [id number]  Reason for call: f/u on diabetes management  Successful call with Ms. Nevares daughter, [name].  Daughter reports patient had o/v with PCP last week with the following adjustments:   1. Levemir increased 16--> 18 units 2. Levemir dose changed from QHS to QAM 3. Continue Novolog SSI as scheduled.  Ensure patient has snack before bedtime.   Daughter reports patient had a shake for lunch yesterday and CBG increased to > 600 at dinnertime.  Patient took SSI insulin but did not eat very much for dinner and did not have a snack.  She wok up at 12:30 AM with a CBG 37.  Daughter gave her juice and a snack and CBG improved.   Daughter is keeping a record of all meals and CBGs to report back to PCP next Monday.  We reviewed that patient is eligible for CGM machine to make checking CBGs easier and less painful for patient.  We also reviewed risks of hypoglycemia and the importance of eating enough carbohydrates at dinner if insulin given and to make sure a small snack given before bed.   Daughter reports understanding.  She will discuss CGM with PCP.  She would like me to reach out to her again in a few weeks.   Plan: F/u with patient and daughter again in 2-3 weeks.   Ralene Bathe, PharmD, [place] [phone]

## 2018-11-26 NOTE — Patient Outreach (Signed)
Telephone assessment:  Placed call to daughter and she answered, Reports her mother is doing okay. Continues to have problems with increased BP and up and down CBG's.  Patient had a telephone visit with MD this week and insulin was changed, Patient had episode of hypoglycemia last night. Reviewed with daughter the importance of a bedtime snack with protein.  Reviewed cheese stick, graham crackers with peanut butter.   Daughter reports patient continues to use walker and has not had any falls since discharged home.   BP range is 130-160's/60-70;s. .  Daughter has bought a salt substitute and is trying to help patient avoid [place], [place] and soups.   PLAN: reviewed with daughter when to report abnormal BP readings and abnormal CBg readings. Reviewed low salt diet and importance of keep BP log. Will plan follow up in 1 month. Encouraged daughter to call sooner if needed.  [name], RN, BSN, CEN [facility] [employer] [phone]

## 2018-11-30 DIAGNOSIS — E538 Deficiency of other specified B group vitamins: Secondary | ICD-10-CM | POA: Diagnosis not present

## 2018-11-30 DIAGNOSIS — Z8673 Personal history of transient ischemic attack (TIA), and cerebral infarction without residual deficits: Secondary | ICD-10-CM | POA: Diagnosis not present

## 2018-11-30 DIAGNOSIS — I50810 Right heart failure, unspecified: Secondary | ICD-10-CM | POA: Diagnosis not present

## 2018-11-30 DIAGNOSIS — E118 Type 2 diabetes mellitus with unspecified complications: Secondary | ICD-10-CM | POA: Diagnosis not present

## 2018-11-30 DIAGNOSIS — E039 Hypothyroidism, unspecified: Secondary | ICD-10-CM | POA: Diagnosis not present

## 2018-11-30 DIAGNOSIS — E559 Vitamin D deficiency, unspecified: Secondary | ICD-10-CM | POA: Diagnosis not present

## 2018-11-30 DIAGNOSIS — E785 Hyperlipidemia, unspecified: Secondary | ICD-10-CM | POA: Diagnosis not present

## 2018-11-30 DIAGNOSIS — I10 Essential (primary) hypertension: Secondary | ICD-10-CM | POA: Diagnosis not present

## 2018-12-07 DIAGNOSIS — I10 Essential (primary) hypertension: Secondary | ICD-10-CM | POA: Diagnosis not present

## 2018-12-07 DIAGNOSIS — E785 Hyperlipidemia, unspecified: Secondary | ICD-10-CM | POA: Diagnosis not present

## 2018-12-07 DIAGNOSIS — Z8673 Personal history of transient ischemic attack (TIA), and cerebral infarction without residual deficits: Secondary | ICD-10-CM | POA: Diagnosis not present

## 2018-12-07 DIAGNOSIS — E039 Hypothyroidism, unspecified: Secondary | ICD-10-CM | POA: Diagnosis not present

## 2018-12-07 DIAGNOSIS — E538 Deficiency of other specified B group vitamins: Secondary | ICD-10-CM | POA: Diagnosis not present

## 2018-12-07 DIAGNOSIS — I50810 Right heart failure, unspecified: Secondary | ICD-10-CM | POA: Diagnosis not present

## 2018-12-07 DIAGNOSIS — E118 Type 2 diabetes mellitus with unspecified complications: Secondary | ICD-10-CM | POA: Diagnosis not present

## 2018-12-07 DIAGNOSIS — E559 Vitamin D deficiency, unspecified: Secondary | ICD-10-CM | POA: Diagnosis not present

## 2018-12-14 DIAGNOSIS — Z8673 Personal history of transient ischemic attack (TIA), and cerebral infarction without residual deficits: Secondary | ICD-10-CM | POA: Diagnosis not present

## 2018-12-14 DIAGNOSIS — Z853 Personal history of malignant neoplasm of breast: Secondary | ICD-10-CM | POA: Diagnosis not present

## 2018-12-14 DIAGNOSIS — E118 Type 2 diabetes mellitus with unspecified complications: Secondary | ICD-10-CM | POA: Diagnosis not present

## 2018-12-14 DIAGNOSIS — E559 Vitamin D deficiency, unspecified: Secondary | ICD-10-CM | POA: Diagnosis not present

## 2018-12-14 DIAGNOSIS — I50810 Right heart failure, unspecified: Secondary | ICD-10-CM | POA: Diagnosis not present

## 2018-12-14 DIAGNOSIS — I10 Essential (primary) hypertension: Secondary | ICD-10-CM | POA: Diagnosis not present

## 2018-12-14 DIAGNOSIS — E039 Hypothyroidism, unspecified: Secondary | ICD-10-CM | POA: Diagnosis not present

## 2018-12-16 ENCOUNTER — Other Ambulatory Visit: Payer: Self-pay

## 2018-12-16 NOTE — Patient Outreach (Signed)
Telephone assessment:  Placed call to daughter [name] to follow up on patient.  Ms. Allean Found reports that her mother is doing well. Daughter reports she continues to keep CBG and BP log. Reports call with MD on 12/14/2018 and medications were changed.  Reports levemir was increased to twice a day.  Daughter reports today CBG of 94 fasting today 338 at lunch and was given SSI per MD.   Reports BP remains up in the am so Losartan was changed to PM dosing.  Daughter reports that she continues to find her mother sneaking sweets. Like doughnuts and villa wafers.  Daughter report main concern is that patient does not remember to take her medications without daughters assistance.  Daughter reports patient is walking daily and trying to stay active.   Reviewed with daughter that patient has achieved her nursing goals.  Continues to be active with [facility].  PLAN: will notify MD and [facility] pharmacy that patient has achieved her goal and is now closed to nursing. Reminded daughter to call for future needs and she verbalized understanding.   THN CM Care Plan Problem One     Most Recent Value  Care Plan Problem One  Recent stroke  Role Documenting the Problem One  Care Management Coordinator  Care Plan for Problem One  Active  THN Long Term Goal   Patient will report no increase in difficulty managing ADLS and IADLS in the next 60 days.   THN Long Term Goal Start Date  10/14/18  THN Long Term Goal Met Date  12/16/18  THN CM Short Term Goal #1   Patient will report monitoring BP twice a day and recoording in the [facility] calendar for the next 30 days.   THN CM Short Term Goal #1 Start Date  10/14/18  THN CM Short Term Goal #1 Met Date  11/26/18  THN CM Short Term Goal #2   Patient and or daughter will report decrease use of salt in the next 30 days.   THN CM Short Term Goal #2 Start Date  10/14/18  THN CM Short Term Goal #2 Met Date  11/26/18  [facility] CM Short Term Goal #3  Patient and or daughter will verbalize  understanding of when to take insulin and when to hold insulin in the next 14 days.   THN CM Short Term Goal #3 Start Date  10/29/18 Barrie Folk not met, date restarted. ]  [facility] CM Short Term Goal #3 Met Date  11/11/18      [name], RN, BSN, CEN [place] Coordinator [phone]

## 2018-12-17 ENCOUNTER — Ambulatory Visit: Payer: Self-pay | Admitting: Pharmacist

## 2018-12-17 ENCOUNTER — Other Ambulatory Visit: Payer: Self-pay | Admitting: Pharmacist

## 2018-12-17 NOTE — Patient Outreach (Signed)
[facility]) Care Management  [place]  12/17/2018  Golden Emile [birth year] [id number]  Reason for call:  Follow-up on medication management with diabetes  Outreach:  Unsuccessful telephone call attempt #1 to patient.   HIPAA compliant voicemail left requesting a return call  Plan:  -I will make another outreach attempt to patient within 3-4 business days.    Ralene Bathe, PharmD, [place] [phone]

## 2018-12-21 DIAGNOSIS — I10 Essential (primary) hypertension: Secondary | ICD-10-CM | POA: Diagnosis not present

## 2018-12-21 DIAGNOSIS — Z8673 Personal history of transient ischemic attack (TIA), and cerebral infarction without residual deficits: Secondary | ICD-10-CM | POA: Diagnosis not present

## 2018-12-21 DIAGNOSIS — E538 Deficiency of other specified B group vitamins: Secondary | ICD-10-CM | POA: Diagnosis not present

## 2018-12-21 DIAGNOSIS — E785 Hyperlipidemia, unspecified: Secondary | ICD-10-CM | POA: Diagnosis not present

## 2018-12-21 DIAGNOSIS — E039 Hypothyroidism, unspecified: Secondary | ICD-10-CM | POA: Diagnosis not present

## 2018-12-21 DIAGNOSIS — Z853 Personal history of malignant neoplasm of breast: Secondary | ICD-10-CM | POA: Diagnosis not present

## 2018-12-21 DIAGNOSIS — E559 Vitamin D deficiency, unspecified: Secondary | ICD-10-CM | POA: Diagnosis not present

## 2018-12-21 DIAGNOSIS — I50810 Right heart failure, unspecified: Secondary | ICD-10-CM | POA: Diagnosis not present

## 2018-12-21 DIAGNOSIS — E118 Type 2 diabetes mellitus with unspecified complications: Secondary | ICD-10-CM | POA: Diagnosis not present

## 2018-12-24 ENCOUNTER — Other Ambulatory Visit: Payer: Self-pay | Admitting: Pharmacist

## 2018-12-24 ENCOUNTER — Ambulatory Visit: Payer: Self-pay | Admitting: Pharmacist

## 2018-12-24 NOTE — Patient Outreach (Signed)
[facility]) Care Management  [place] 12/24/2018  Raylinn Kosar [birth year] [id number]  Reason for call:  F/u on diabetes management  Successful call with patient's daughter, Amber Ballard.  Amber Ballard reports patient has been following up closely with Dr. Micheal Likens for diabetes medication adjustment.  She is now using Levemir BID with Novolog SSI throughout the day and has seen improvement in CBGs.  We discussed CGM and daughter will ask PCP about this at next visit in person at office next month for lab draw.  Daughter denies having any further medication questions at this time.  She has my phone number if she needs to reach out to me in the future.   Plan: Will close [facility] pharmacy case as no further medication needs at this time.  Am happy to help in the future as needed.   Ralene Bathe, PharmD, [place] [phone]

## 2019-01-20 ENCOUNTER — Inpatient Hospital Stay (HOSPITAL_COMMUNITY)
Admission: AD | Admit: 2019-01-20 | Discharge: 2019-01-22 | DRG: 638 | Disposition: A | Discharge status: Home Health | Payer: PPO | Source: Other Acute Inpatient Hospital | Attending: Internal Medicine | Admitting: Internal Medicine

## 2019-01-20 DIAGNOSIS — S2241XA Multiple fractures of ribs, right side, initial encounter for closed fracture: Secondary | ICD-10-CM | POA: Diagnosis not present

## 2019-01-20 DIAGNOSIS — Z7982 Long term (current) use of aspirin: Secondary | ICD-10-CM

## 2019-01-20 DIAGNOSIS — Z833 Family history of diabetes mellitus: Secondary | ICD-10-CM | POA: Diagnosis not present

## 2019-01-20 DIAGNOSIS — W1830XA Fall on same level, unspecified, initial encounter: Secondary | ICD-10-CM | POA: Diagnosis present

## 2019-01-20 DIAGNOSIS — R911 Solitary pulmonary nodule: Secondary | ICD-10-CM | POA: Diagnosis present

## 2019-01-20 DIAGNOSIS — Z79890 Hormone replacement therapy (postmenopausal): Secondary | ICD-10-CM

## 2019-01-20 DIAGNOSIS — N179 Acute kidney failure, unspecified: Secondary | ICD-10-CM | POA: Diagnosis present

## 2019-01-20 DIAGNOSIS — E785 Hyperlipidemia, unspecified: Secondary | ICD-10-CM | POA: Diagnosis present

## 2019-01-20 DIAGNOSIS — Z90710 Acquired absence of both cervix and uterus: Secondary | ICD-10-CM

## 2019-01-20 DIAGNOSIS — Z794 Long term (current) use of insulin: Secondary | ICD-10-CM | POA: Diagnosis not present

## 2019-01-20 DIAGNOSIS — I491 Atrial premature depolarization: Secondary | ICD-10-CM | POA: Diagnosis not present

## 2019-01-20 DIAGNOSIS — N182 Chronic kidney disease, stage 2 (mild): Secondary | ICD-10-CM | POA: Diagnosis present

## 2019-01-20 DIAGNOSIS — Z8673 Personal history of transient ischemic attack (TIA), and cerebral infarction without residual deficits: Secondary | ICD-10-CM | POA: Diagnosis not present

## 2019-01-20 DIAGNOSIS — I4891 Unspecified atrial fibrillation: Secondary | ICD-10-CM | POA: Diagnosis not present

## 2019-01-20 DIAGNOSIS — R0902 Hypoxemia: Secondary | ICD-10-CM | POA: Diagnosis not present

## 2019-01-20 DIAGNOSIS — I4510 Unspecified right bundle-branch block: Secondary | ICD-10-CM | POA: Diagnosis not present

## 2019-01-20 DIAGNOSIS — Z1159 Encounter for screening for other viral diseases: Secondary | ICD-10-CM

## 2019-01-20 DIAGNOSIS — E1110 Type 2 diabetes mellitus with ketoacidosis without coma: Principal | ICD-10-CM | POA: Diagnosis present

## 2019-01-20 DIAGNOSIS — D72829 Elevated white blood cell count, unspecified: Secondary | ICD-10-CM | POA: Diagnosis present

## 2019-01-20 DIAGNOSIS — E1111 Type 2 diabetes mellitus with ketoacidosis with coma: Secondary | ICD-10-CM | POA: Diagnosis not present

## 2019-01-20 DIAGNOSIS — I509 Heart failure, unspecified: Secondary | ICD-10-CM | POA: Diagnosis not present

## 2019-01-20 DIAGNOSIS — Z7902 Long term (current) use of antithrombotics/antiplatelets: Secondary | ICD-10-CM | POA: Diagnosis not present

## 2019-01-20 DIAGNOSIS — I110 Hypertensive heart disease with heart failure: Secondary | ICD-10-CM | POA: Diagnosis present

## 2019-01-20 DIAGNOSIS — Y92009 Unspecified place in unspecified non-institutional (private) residence as the place of occurrence of the external cause: Secondary | ICD-10-CM

## 2019-01-20 DIAGNOSIS — E1122 Type 2 diabetes mellitus with diabetic chronic kidney disease: Secondary | ICD-10-CM | POA: Diagnosis present

## 2019-01-20 DIAGNOSIS — I130 Hypertensive heart and chronic kidney disease with heart failure and stage 1 through stage 4 chronic kidney disease, or unspecified chronic kidney disease: Secondary | ICD-10-CM | POA: Diagnosis not present

## 2019-01-20 DIAGNOSIS — Z87891 Personal history of nicotine dependence: Secondary | ICD-10-CM | POA: Diagnosis not present

## 2019-01-20 DIAGNOSIS — R531 Weakness: Secondary | ICD-10-CM | POA: Diagnosis not present

## 2019-01-20 DIAGNOSIS — E039 Hypothyroidism, unspecified: Secondary | ICD-10-CM | POA: Diagnosis not present

## 2019-01-20 DIAGNOSIS — Z79899 Other long term (current) drug therapy: Secondary | ICD-10-CM

## 2019-01-20 DIAGNOSIS — I5030 Unspecified diastolic (congestive) heart failure: Secondary | ICD-10-CM | POA: Diagnosis present

## 2019-01-20 DIAGNOSIS — Z66 Do not resuscitate: Secondary | ICD-10-CM | POA: Diagnosis present

## 2019-01-20 DIAGNOSIS — S299XXA Unspecified injury of thorax, initial encounter: Secondary | ICD-10-CM | POA: Diagnosis not present

## 2019-01-20 DIAGNOSIS — R410 Disorientation, unspecified: Secondary | ICD-10-CM | POA: Diagnosis not present

## 2019-01-20 DIAGNOSIS — E876 Hypokalemia: Secondary | ICD-10-CM | POA: Diagnosis present

## 2019-01-20 DIAGNOSIS — E875 Hyperkalemia: Secondary | ICD-10-CM | POA: Diagnosis not present

## 2019-01-20 DIAGNOSIS — E1165 Type 2 diabetes mellitus with hyperglycemia: Secondary | ICD-10-CM | POA: Diagnosis not present

## 2019-01-20 DIAGNOSIS — D539 Nutritional anemia, unspecified: Secondary | ICD-10-CM | POA: Diagnosis not present

## 2019-01-20 DIAGNOSIS — E1151 Type 2 diabetes mellitus with diabetic peripheral angiopathy without gangrene: Secondary | ICD-10-CM | POA: Diagnosis not present

## 2019-01-20 LAB — GLUCOSE, CAPILLARY: Glucose-Capillary: 188 mg/dL — ABNORMAL HIGH (ref 70–99)

## 2019-01-20 MED ORDER — DEXTROSE-NACL 5-0.45 % IV SOLN
INTRAVENOUS | Status: DC
  Administered 2019-01-20: 50 mL/h via INTRAVENOUS

## 2019-01-20 MED ORDER — INSULIN REGULAR(HUMAN) IN NACL 100-0.9 UT/100ML-% IV SOLN: INTRAVENOUS | Status: DC

## 2019-01-20 MED ORDER — HYDROCODONE-ACETAMINOPHEN 5-325 MG PO TABS: 1.0000 | ORAL_TABLET | Freq: Four times a day (QID) | ORAL | Status: DC | PRN

## 2019-01-20 MED ORDER — SODIUM CHLORIDE 0.9 % IV SOLN
INTRAVENOUS | Status: DC
  Administered 2019-01-21: 30 mL/h via INTRAVENOUS

## 2019-01-20 MED ORDER — ACETAMINOPHEN 325 MG PO TABS: 650.0000 mg | ORAL_TABLET | Freq: Four times a day (QID) | ORAL | Status: DC | PRN

## 2019-01-20 MED ORDER — HEPARIN SODIUM (PORCINE) 5000 UNIT/ML IJ SOLN
5000.0000 [IU] | Freq: Three times a day (TID) | INTRAMUSCULAR | Status: DC
  Administered 2019-01-21: 07:00:00 5000 [IU] via SUBCUTANEOUS
  Filled 2019-01-20: qty 1

## 2019-01-20 NOTE — Progress Notes (Signed)
Pt admitted to 1232 via carelink from [place].  Pt alert, oriented, no discomfort.  Admitting doctor notified.  cbg at 2225  Is 266

## 2019-01-20 NOTE — H&P (Addendum)
History and Physical    Amber Ballard AYT:[id number] DOB: 1932/07/06 DOA: 01/20/2019  PCP: Ocie Doyne., MD   Patient coming from: Home   Chief Complaint: Fall, hyperglycemia   HPI: Amber Ballard is a 85 y.o. female with medical history significant for insulin-dependent diabetes mellitus, history of CVA, hypothyroidism, and previously on Xarelto with unknown indication, now presenting to the emergency department for evaluation of elevated blood glucose and a fall at home.  Patient reports that she had been in her usual state of health, but her glucose was running higher than usual yesterday.  She lost her footing while standing up from the commode, fell, and struck her right side on the bathtub without hitting her head or losing consciousness.  She has not had much pain from the fall and denies any chest pain or abdominal pain.  She also denies fevers, chills, cough, shortness of breath, or dysuria.  Family reports that her CBG read "high" today, and when they tried to administer insulin, the insulin pen was not working.  Family suspects that the insulin pen may not have been working yesterday as well, leading to the hyperglycemia.  [facility] ED Course: Upon arrival to the ED, patient is found to be afebrile, saturating low 90s room air, and with vitals otherwise stable.  EKG features atrial fibrillation with PVC and incomplete RBBB.  Chest x-ray is notable for mild increased interstitial density bilaterally.  Chest CT reveals acute fractures of the right posterior ninth and 10th ribs as well as cardiomegaly with mild interstitial edema and small bilateral pleural effusions.  Right upper lobe lung nodule is also noted on the CT incidentally.  Chemistry panel features a glucose of 666, potassium 5.2, bicarbonate 10, creatinine 1.70, and anion gap 31.  CBC is notable for leukocytosis to 11,300 and a macrocytic anemia.  Troponin was undetectable, proBNP elevated 4600, and lactic acid 2.3.  Urine ketones are  noted.  Patient was given 2 L of normal saline, DuoNeb's, bicarbonate, Rocephin, and started on insulin infusion.  Arrangements were made for admission to [facility] for further evaluation and management.  Review of Systems:  All other systems reviewed and apart from HPI, are negative.  Past Medical History:  Diagnosis Date  . Anxiety   . Arthritis   . Diabetes mellitus   . Heart murmur   . Hyperlipidemia   . Hypertension   . Hypothyroidism   . PONV (postoperative nausea and vomiting)   . Stroke ([place])   . Wears glasses     Past Surgical History:  Procedure Laterality Date  . ABDOMINAL HYSTERECTOMY    . BREAST SURGERY     lt lumpectomy-snbx  . COLONOSCOPY    . EYE SURGERY     both cataracts  . FRACTURE SURGERY     rt arm,rt fingers  . ORIF ANKLE FRACTURE  03/26/2012   Procedure: OPEN REDUCTION INTERNAL FIXATION (ORIF) ANKLE FRACTURE;  Surgeon: Wylene Simmer, MD;  Location: [facility];  Service: Orthopedics;  Laterality: Right;  Open Reduction Internal Fixation Right Ankle Trimalleolar Fracture      reports that she quit smoking about 8 years ago. She has never used smokeless tobacco. She reports that she does not drink alcohol or use drugs.  No Known Allergies  Family History  Problem Relation Age of Onset  . Diabetes Mother      Prior to Admission medications   Medication Sig Start Date End Date Taking? Authorizing Provider  amLODipine (NORVASC) 10 MG tablet Take  10 mg by mouth daily.  04/14/18   [provider]  aspirin 81 MG tablet Take 1 tablet (81 mg total) by mouth daily. 09/14/18   Garvin Fila, MD  atorvastatin (LIPITOR) 80 MG tablet Take 80 mg by mouth daily at 6 PM.  08/17/18   [provider]  Calcium Polycarbophil (FIBER LAXATIVE PO) Take 1 tablet by mouth daily as needed.     [provider]  clopidogrel (PLAVIX) 75 MG tablet Take 75 mg by mouth daily.    [provider]  furosemide (LASIX) 40 MG tablet Take 40 mg by  mouth daily.  08/25/18   [provider]  hydrALAZINE (APRESOLINE) 100 MG tablet Take 100 mg by mouth every 8 (eight) hours.    [provider]  insulin aspart (NOVOLOG) 100 UNIT/ML injection Inject 0-14 Units into the skin 3 (three) times daily with meals. Sliding scale per Dr. Micheal Likens    [provider]  insulin detemir (LEVEMIR) 100 UNIT/ML injection Inject 15 Units into the skin at bedtime. Patient taking differently: Inject 16 Units into the skin 2 (two) times daily.  03/30/12   Wylene Simmer, MD  levothyroxine (SYNTHROID, LEVOTHROID) 75 MCG tablet Take 75 mcg by mouth daily before breakfast.  08/17/18   [provider]  losartan (COZAAR) 100 MG tablet Take 100 mg by mouth daily.  08/06/18   [provider]  Magnesium 400 MG TABS Take 400 mg by mouth 2 (two) times daily.    [provider]  meclizine (ANTIVERT) 25 MG tablet Take 25 mg by mouth 3 (three) times daily as needed for dizziness.    [provider]  metoprolol tartrate (LOPRESSOR) 100 MG tablet Take 100 mg by mouth 2 (two) times daily.     [provider]  potassium chloride (K-DUR,KLOR-CON) 10 MEQ tablet Take 10 mEq by mouth daily at 3 pm.    [provider]  senna (SENOKOT) 8.6 MG TABS Take 2 tablets (17.2 mg total) by mouth 2 (two) times daily. Patient taking differently: Take 2 tablets by mouth daily as needed.  03/29/12   Wylene Simmer, MD  vitamin B-12 (CYANOCOBALAMIN) 1000 MCG tablet Take 1,000 mcg by mouth daily.    [provider]  Vitamin D, Ergocalciferol, (DRISDOL) 1.25 MG (50000 UT) CAPS capsule Take 50,000 Units by mouth every 7 (seven) days.    [provider]    Physical Exam: Vitals:   01/20/19 2100  Temp: 98 F (36.7 C)  TempSrc: Oral    Constitutional: NAD, calm  Eyes: PERTLA, lids and conjunctivae normal ENMT: Mucous membranes are moist. Posterior pharynx clear of any exudate or lesions.   Neck: normal, supple, no  masses, no thyromegaly Respiratory: no wheezing, no rhonchi. No accessory muscle use.  Cardiovascular: S1 & S2 heard, regular rate and rhythm. Trace pretibial edema bilaterally. Abdomen: No distension, no tenderness, soft. Bowel sounds active.  Musculoskeletal: no clubbing / cyanosis. No joint deformity upper and lower extremities.   Skin: no significant rashes, lesions, ulcers. Warm, dry, well-perfused. Neurologic: CN 2-12 grossly intact. Sensation intact. Strength 5/5 in all 4 limbs.  Psychiatric:  Alert and oriented to person, place, and situation. Very pleasant, cooperative.    Labs on Admission: I have personally reviewed following labs and imaging studies  CBC: No results for input(s): WBC, NEUTROABS, HGB, HCT, MCV, PLT in the last 168 hours. Basic Metabolic Panel: No results for input(s): NA, K, CL, CO2, GLUCOSE, BUN, CREATININE, CALCIUM, MG, PHOS in  the last 168 hours. GFR: CrCl cannot be calculated (Patient's most recent lab result is older than the maximum 21 days allowed.). Liver Function Tests: No results for input(s): AST, ALT, ALKPHOS, BILITOT, PROT, ALBUMIN in the last 168 hours. No results for input(s): LIPASE, AMYLASE in the last 168 hours. No results for input(s): AMMONIA in the last 168 hours. Coagulation Profile: No results for input(s): INR, PROTIME in the last 168 hours. Cardiac Enzymes: No results for input(s): CKTOTAL, CKMB, CKMBINDEX, TROPONINI in the last 168 hours. BNP (last 3 results) No results for input(s): PROBNP in the last 8760 hours. HbA1C: No results for input(s): HGBA1C in the last 72 hours. CBG: No results for input(s): GLUCAP in the last 168 hours. Lipid Profile: No results for input(s): CHOL, HDL, LDLCALC, TRIG, CHOLHDL, LDLDIRECT in the last 72 hours. Thyroid Function Tests: No results for input(s): TSH, T4TOTAL, FREET4, T3FREE, THYROIDAB in the last 72 hours. Anemia Panel: No results for input(s): VITAMINB12, FOLATE, FERRITIN, TIBC, IRON,  RETICCTPCT in the last 72 hours. Urine analysis: No results found for: COLORURINE, APPEARANCEUR, LABSPEC, PHURINE, GLUCOSEU, HGBUR, BILIRUBINUR, KETONESUR, PROTEINUR, UROBILINOGEN, NITRITE, LEUKOCYTESUR Sepsis Labs: @LABRCNTIP (procalcitonin:4,lacticidven:4) )No results found for this or any previous visit (from the past 240 hour(s)).   Radiological Exams on Admission: No results found.  EKG: Independently reviewed. Atrial fibrillation, PVC, incomplete RBBB.   Assessment/Plan   1. DKA; insulin-dependent DM  - Presents with hyperglycemia and a fall at home - Her CBG read "high" today and when family tried to administer insulin, the pen was not working and they suspect it wasn't working the day before as sugars were unusually high  - Serum glucose is 670 on admission with bicarb of 10, AG 31, and urine ketones  - She was given 2 liters NS in ED and started on insulin infusion  - Continue insulin infusion with frequent CBG's and serial chem panels    2. Right rib fractures  - Presents after a fall at home in which she struck her right chest on bathtub; she denies LOC or hitting her head   - Fractures of posterior 9th and 10th ribs on the right noted on CT  - Patient denies pain on admission  - Incentive spirometry, pain-control as needed     3. Acute on chronic CHF  - BNP is significantly elevated and cardiomegaly with mild interstitial edema and small effusions noted on CXR  - She denies hx of CHF but Lasix noted on med list; no echo on file  - She was given 2 liters NS in ED in setting of DKA  - Does not appear dyspneic and denies SOB  - Minimize IVF, follow daily wt and I/O's, check echocardiogram    4. Atrial fibrillation  - Appears to be in atrial fibrillation on EKG from ED  - Patient and family unaware of arrhythmia hx but she was previously on Xarelto for unknown indication  - Continue cardiac monitoring, treat DKA, check TSH and echocardiogram   5. Hypothyroidism  -  Pharmacy med-rec pending    6. Renal insufficiency  - SCr is 1.70 in ED, previously normal though no recent labs available  - She was given 2 liters NS in ED  - Renally-dose medications, avoid nephrotoxins, repeat chem panel   7. Lung nodule  - RUL nodule noted incidentally on CT in ED  - Outpatient follow-up recommended    PPE: Mask, face shield  DVT prophylaxis: sq heparin   Code Status: DNR Family Communication: Daughter  updated by phone  Consults called: None  Admission status: Inpatient     Vianne Bulls, MD [facility] Pager [phone]  If 7PM-7AM, please contact night-coverage [email or web address] Password [facility]  01/20/2019, 11:03 PM

## 2019-01-21 ENCOUNTER — Encounter (HOSPITAL_COMMUNITY): Payer: Self-pay

## 2019-01-21 ENCOUNTER — Other Ambulatory Visit: Payer: Self-pay

## 2019-01-21 DIAGNOSIS — I4891 Unspecified atrial fibrillation: Secondary | ICD-10-CM

## 2019-01-21 DIAGNOSIS — E1110 Type 2 diabetes mellitus with ketoacidosis without coma: Principal | ICD-10-CM

## 2019-01-21 DIAGNOSIS — R911 Solitary pulmonary nodule: Secondary | ICD-10-CM | POA: Diagnosis present

## 2019-01-21 DIAGNOSIS — E039 Hypothyroidism, unspecified: Secondary | ICD-10-CM

## 2019-01-21 DIAGNOSIS — I509 Heart failure, unspecified: Secondary | ICD-10-CM

## 2019-01-21 LAB — CBC WITH DIFFERENTIAL/PLATELET
Abs Immature Granulocytes: 0.06 10*3/uL (ref 0.00–0.07)
Basophils Absolute: 0.1 10*3/uL (ref 0.0–0.1)
Basophils Relative: 1 %
Eosinophils Absolute: 1.1 10*3/uL — ABNORMAL HIGH (ref 0.0–0.5)
Eosinophils Relative: 7 %
HCT: 29.7 % — ABNORMAL LOW (ref 36.0–46.0)
Hemoglobin: 9.4 g/dL — ABNORMAL LOW (ref 12.0–15.0)
Immature Granulocytes: 0 %
Lymphocytes Relative: 11 %
Lymphs Abs: 1.8 10*3/uL (ref 0.7–4.0)
MCH: 30.6 pg (ref 26.0–34.0)
MCHC: 31.6 g/dL (ref 30.0–36.0)
MCV: 96.7 fL (ref 80.0–100.0)
Monocytes Absolute: 1.5 10*3/uL — ABNORMAL HIGH (ref 0.1–1.0)
Monocytes Relative: 9 %
Neutro Abs: 11.4 10*3/uL — ABNORMAL HIGH (ref 1.7–7.7)
Neutrophils Relative %: 72 %
Platelets: 275 10*3/uL (ref 150–400)
RBC: 3.07 MIL/uL — ABNORMAL LOW (ref 3.87–5.11)
RDW: 13.4 % (ref 11.5–15.5)
WBC: 16 10*3/uL — ABNORMAL HIGH (ref 4.0–10.5)
nRBC: 0 % (ref 0.0–0.2)

## 2019-01-21 LAB — GLUCOSE, CAPILLARY
Glucose-Capillary: 101 mg/dL — ABNORMAL HIGH (ref 70–99)
Glucose-Capillary: 104 mg/dL — ABNORMAL HIGH (ref 70–99)
Glucose-Capillary: 112 mg/dL — ABNORMAL HIGH (ref 70–99)
Glucose-Capillary: 119 mg/dL — ABNORMAL HIGH (ref 70–99)
Glucose-Capillary: 143 mg/dL — ABNORMAL HIGH (ref 70–99)
Glucose-Capillary: 165 mg/dL — ABNORMAL HIGH (ref 70–99)
Glucose-Capillary: 212 mg/dL — ABNORMAL HIGH (ref 70–99)
Glucose-Capillary: 224 mg/dL — ABNORMAL HIGH (ref 70–99)
Glucose-Capillary: 329 mg/dL — ABNORMAL HIGH (ref 70–99)
Glucose-Capillary: 69 mg/dL — ABNORMAL LOW (ref 70–99)

## 2019-01-21 LAB — BASIC METABOLIC PANEL
Anion gap: 10 (ref 5–15)
Anion gap: 13 (ref 5–15)
Anion gap: 15 (ref 5–15)
BUN: 46 mg/dL — ABNORMAL HIGH (ref 8–23)
BUN: 49 mg/dL — ABNORMAL HIGH (ref 8–23)
BUN: 53 mg/dL — ABNORMAL HIGH (ref 8–23)
CO2: 21 mmol/L — ABNORMAL LOW (ref 22–32)
CO2: 21 mmol/L — ABNORMAL LOW (ref 22–32)
CO2: 23 mmol/L (ref 22–32)
Calcium: 7.8 mg/dL — ABNORMAL LOW (ref 8.9–10.3)
Calcium: 8 mg/dL — ABNORMAL LOW (ref 8.9–10.3)
Calcium: 8.1 mg/dL — ABNORMAL LOW (ref 8.9–10.3)
Chloride: 103 mmol/L (ref 98–111)
Chloride: 104 mmol/L (ref 98–111)
Chloride: 105 mmol/L (ref 98–111)
Creatinine, Ser: 1.57 mg/dL — ABNORMAL HIGH (ref 0.44–1.00)
Creatinine, Ser: 1.59 mg/dL — ABNORMAL HIGH (ref 0.44–1.00)
Creatinine, Ser: 1.78 mg/dL — ABNORMAL HIGH (ref 0.44–1.00)
GFR calc Af Amer: 30 mL/min — ABNORMAL LOW (ref >60)
GFR calc Af Amer: 34 mL/min — ABNORMAL LOW (ref >60)
GFR calc Af Amer: 35 mL/min — ABNORMAL LOW (ref >60)
GFR calc non Af Amer: 26 mL/min — ABNORMAL LOW (ref >60)
GFR calc non Af Amer: 30 mL/min — ABNORMAL LOW (ref >60)
GFR calc non Af Amer: 30 mL/min — ABNORMAL LOW (ref >60)
Glucose, Bld: 167 mg/dL — ABNORMAL HIGH (ref 70–99)
Glucose, Bld: 170 mg/dL — ABNORMAL HIGH (ref 70–99)
Glucose, Bld: 185 mg/dL — ABNORMAL HIGH (ref 70–99)
Potassium: 3 mmol/L — ABNORMAL LOW (ref 3.5–5.1)
Potassium: 3.4 mmol/L — ABNORMAL LOW (ref 3.5–5.1)
Potassium: 4.8 mmol/L (ref 3.5–5.1)
Sodium: 138 mmol/L (ref 135–145)
Sodium: 138 mmol/L (ref 135–145)
Sodium: 139 mmol/L (ref 135–145)

## 2019-01-21 LAB — SURGICAL PCR SCREEN
MRSA, PCR: NEGATIVE
Staphylococcus aureus: NEGATIVE

## 2019-01-21 LAB — TSH: TSH: 1.969 u[IU]/mL (ref 0.350–4.500)

## 2019-01-21 LAB — HEMOGLOBIN A1C
Hgb A1c MFr Bld: 9.5 % — ABNORMAL HIGH (ref 4.8–5.6)
Mean Plasma Glucose: 225.95 mg/dL

## 2019-01-21 MED ORDER — LEVOTHYROXINE SODIUM 50 MCG PO TABS
75.0000 ug | ORAL_TABLET | Freq: Every day | ORAL | Status: DC
  Administered 2019-01-22: 75 ug via ORAL
  Filled 2019-01-21: qty 1

## 2019-01-21 MED ORDER — CHLORHEXIDINE GLUCONATE CLOTH 2 % EX PADS: 6.0000 | MEDICATED_PAD | Freq: Every day | CUTANEOUS | Status: DC

## 2019-01-21 MED ORDER — MUPIROCIN 2 % EX OINT: 1.0000 "application " | TOPICAL_OINTMENT | Freq: Two times a day (BID) | CUTANEOUS | Status: DC

## 2019-01-21 MED ORDER — HYDRALAZINE HCL 50 MG PO TABS
100.0000 mg | ORAL_TABLET | Freq: Three times a day (TID) | ORAL | Status: DC
  Administered 2019-01-21 – 2019-01-22 (×4): 100 mg via ORAL
  Filled 2019-01-21 (×5): qty 2

## 2019-01-21 MED ORDER — INSULIN DETEMIR 100 UNIT/ML ~~LOC~~ SOLN
15.0000 [IU] | Freq: Every day | SUBCUTANEOUS | Status: DC
  Administered 2019-01-21 (×2): 15 [IU] via SUBCUTANEOUS
  Filled 2019-01-21 (×3): qty 0.15

## 2019-01-21 MED ORDER — POTASSIUM CHLORIDE CRYS ER 20 MEQ PO TBCR
40.0000 meq | EXTENDED_RELEASE_TABLET | Freq: Once | ORAL | Status: AC
  Administered 2019-01-21: 40 meq via ORAL
  Filled 2019-01-21: qty 2

## 2019-01-21 MED ORDER — INSULIN ASPART 100 UNIT/ML ~~LOC~~ SOLN
0.0000 [IU] | Freq: Three times a day (TID) | SUBCUTANEOUS | Status: DC
  Administered 2019-01-21: 17:00:00 2 [IU] via SUBCUTANEOUS
  Administered 2019-01-21 – 2019-01-22 (×2): 3 [IU] via SUBCUTANEOUS

## 2019-01-21 MED ORDER — CLOPIDOGREL BISULFATE 75 MG PO TABS
75.0000 mg | ORAL_TABLET | Freq: Every day | ORAL | Status: DC
  Administered 2019-01-21 – 2019-01-22 (×2): 75 mg via ORAL
  Filled 2019-01-21 (×2): qty 1

## 2019-01-21 MED ORDER — METOPROLOL TARTRATE 25 MG PO TABS
100.0000 mg | ORAL_TABLET | Freq: Two times a day (BID) | ORAL | Status: DC
  Administered 2019-01-21: 10:00:00 100 mg via ORAL
  Filled 2019-01-21: qty 4

## 2019-01-21 MED ORDER — ASPIRIN 81 MG PO CHEW
81.0000 mg | CHEWABLE_TABLET | Freq: Every day | ORAL | Status: DC
  Administered 2019-01-21 – 2019-01-22 (×2): 81 mg via ORAL
  Filled 2019-01-21 (×2): qty 1

## 2019-01-21 MED ORDER — POTASSIUM CHLORIDE CRYS ER 20 MEQ PO TBCR
30.0000 meq | EXTENDED_RELEASE_TABLET | ORAL | Status: AC
  Administered 2019-01-21 (×2): 30 meq via ORAL
  Filled 2019-01-21 (×2): qty 1

## 2019-01-21 MED ORDER — ATORVASTATIN CALCIUM 40 MG PO TABS
80.0000 mg | ORAL_TABLET | Freq: Every day | ORAL | Status: DC
  Administered 2019-01-21: 18:00:00 80 mg via ORAL
  Filled 2019-01-21: qty 2

## 2019-01-21 MED ORDER — AMLODIPINE BESYLATE 10 MG PO TABS
10.0000 mg | ORAL_TABLET | Freq: Every day | ORAL | Status: DC
  Administered 2019-01-21 – 2019-01-22 (×2): 10 mg via ORAL
  Filled 2019-01-21 (×2): qty 1

## 2019-01-21 MED ORDER — METOPROLOL TARTRATE 50 MG PO TABS
50.0000 mg | ORAL_TABLET | Freq: Two times a day (BID) | ORAL | Status: DC
  Administered 2019-01-21 – 2019-01-22 (×2): 50 mg via ORAL
  Filled 2019-01-21 (×2): qty 1

## 2019-01-21 NOTE — Progress Notes (Signed)
cbg 146, glucose stabilizer started per orders.  Dr. Myna Hidalgo aware of results, and lab pending.

## 2019-01-21 NOTE — Progress Notes (Signed)
cbg 188, insulin at 5 units/hr.  Dr. Myna Hidalgo assessing patient

## 2019-01-21 NOTE — Progress Notes (Addendum)
PROGRESS NOTE    Alnita Aybar  GNF:[id number] DOB: 03/14/1932 DOA: 01/20/2019 PCP: Ocie Doyne., MD    Brief Narrative:  85 year old female who presented after sustaining a fall.  She does have significant past medical history for type 2 diabetes mellitus, history of CVA, and hypothyroidism.  She reported hyperglycemia for 24 hours, she sustained a mechanical fall while standing up from the commode, positive head trauma on the right side, no loss of consciousness.  Apparently her insulin pen was not working properly.  On her initial physical examination she was hemodynamically stable, moist mucous membranes, lungs clear to auscultation bilaterally, heart S1-S2 present and rhythmic, abdomen soft, trace bilateral lower extremity edema.  Sodium 139, potassium 3.0, chloride 103, bicarb 21, glucose 185, BUN 53, creatinine 1.78, anion gap 15, white count 16.0, hemoglobin 9.4, hematocrit 29.7, platelets 275.  Her hemoglobin A1c 9.5  Patient was admitted to the hospital with a working diagnosis of diabetes ketoacidosis.  Assessment & Plan:   Principal Problem:   DKA (diabetic ketoacidoses) ([place]) Active Problems:   Multiple fractures of ribs, right side, initial encounter for closed fracture   CHF, acute on chronic (HCC)   Unspecified atrial fibrillation (HCC)   Hypothyroidism   History of CVA (cerebrovascular accident)   Lung nodule   1. Diabetes ketoacidosis. Improved anion gap this am down to 13, fasting glucose 170 mg/dl. No nausea or vomiting. Insulin drip has been discontinue and she received 15 units of insulin levimir last night. This am glucose down to 69. Will continue insulin therapy, check BMP this pm. Patient can be transferred to the medical ward. 40 Kcl supplementation po today.   2. HTN. Uncontrolled HTN, will resume amlodipine and hydralazine for blood pressure control, hold on losartan and furosemide for now due to risk of worsening renal function.   3. AKI (unkown base renal  function) with hypokalemia. Suspect some degree of CKD, serum cr today at 1,59 from 1,78, K at 3,4 and serum bicarbonate at 21. Will follow on renal function this pm, avoid nephrotoxic medications or hypotension.   4. Hx of atrial fibrillation. Will resume metoprolol for rate control, continue telemetry monitor, anticoagulation with asa and clopidogrel.   5. Diastolic heart failure. Clinically stable with no signs of decompensation, will continue to hold on furosemide and valsartan, will resume metoprolol. Hold on IV fluids.   6. Hypothyroid. Continue levothyroxine.    DVT prophylaxis: enoxaparin   Code Status: full Family Communication: no family at the bedside  Disposition Plan/ discharge barriers: transfer to medical ward.   Body mass index is 24.4 kg/m. Malnutrition Type:      Malnutrition Characteristics:      Nutrition Interventions:     RN Pressure Injury Documentation:     Consultants:     Procedures:     Antimicrobials:       Subjective: Patient is feeling better, no nausea or vomiting, no chest pain or dyspnea, at home uses a walker. Positive weakness, not back to her baseline yet.   Objective: Vitals:   01/21/19 0400 01/21/19 0500 01/21/19 0600 01/21/19 0700  BP: (!) 148/40 (!) 146/33 (!) 114/32 (!) 163/33  Pulse: 70 65 63   Resp: 18 10 (!) 6 16  Temp:      TempSrc:      SpO2: 93% 96% 94%   Weight:      Height:        Intake/Output Summary (Last 24 hours) at 01/21/2019 0757 Last data filed at 01/21/2019  0600 Gross per 24 hour  Intake 379.87 ml  Output 600 ml  Net -220.13 ml   Filed Weights   01/20/19 2230  Weight: 60.5 kg    Examination:   General: deconditioned  Neurology: Awake and alert, non focal  E ENT: mild pallor, no icterus, oral mucosa moist Cardiovascular: No JVD. S1-S2 present, rhythmic, no gallops, rubs, or murmurs. No lower extremity edema. Pulmonary: positive breath sounds bilaterally, adequate air movement, no  wheezing, rhonchi or rales. Gastrointestinal. Abdomen with no organomegaly, non tender, no rebound or guarding Skin. No rashes Musculoskeletal: no joint deformities     Data Reviewed: I have personally reviewed following labs and imaging studies  CBC: Recent Labs  Lab 01/21/19 0356  WBC 16.0*  NEUTROABS 11.4*  HGB 9.4*  HCT 29.7*  MCV 96.7  PLT 157   Basic Metabolic Panel: Recent Labs  Lab 01/20/19 2348 01/21/19 0356  NA 139 138  K 3.0* 3.4*  CL 103 104  CO2 21* 21*  GLUCOSE 185* 170*  BUN 53* 49*  CREATININE 1.78* 1.59*  CALCIUM 8.1* 7.8*   GFR: Estimated Creatinine Clearance: 22.6 mL/min (A) (by C-G formula based on SCr of 1.59 mg/dL (H)). Liver Function Tests: No results for input(s): AST, ALT, ALKPHOS, BILITOT, PROT, ALBUMIN in the last 168 hours. No results for input(s): LIPASE, AMYLASE in the last 168 hours. No results for input(s): AMMONIA in the last 168 hours. Coagulation Profile: No results for input(s): INR, PROTIME in the last 168 hours. Cardiac Enzymes: No results for input(s): CKTOTAL, CKMB, CKMBINDEX, TROPONINI in the last 168 hours. BNP (last 3 results) No results for input(s): PROBNP in the last 8760 hours. HbA1C: Recent Labs    01/21/19 0356  HGBA1C 9.5*   CBG: Recent Labs  Lab 01/20/19 2328 01/21/19 0003 01/21/19 0103 01/21/19 0151 01/21/19 0636  GLUCAP 188* 143* 112* 119* 101*   Lipid Profile: No results for input(s): CHOL, HDL, LDLCALC, TRIG, CHOLHDL, LDLDIRECT in the last 72 hours. Thyroid Function Tests: Recent Labs    01/20/19 2348  TSH 1.969   Anemia Panel: No results for input(s): VITAMINB12, FOLATE, FERRITIN, TIBC, IRON, RETICCTPCT in the last 72 hours.    Radiology Studies: I have reviewed all of the imaging during this hospital visit personally     Scheduled Meds: . heparin  5,000 Units Subcutaneous Q8H  . insulin aspart  0-9 Units Subcutaneous TID WC  . insulin detemir  15 Units Subcutaneous QHS    Continuous Infusions: . sodium chloride 30 mL/hr at 01/21/19 0600     LOS: 1 day         Gerome Apley, MD

## 2019-01-21 NOTE — Progress Notes (Signed)
Insulin drip off per protocol.  Labs resulted and called to mid level.

## 2019-01-21 NOTE — Progress Notes (Signed)
Cbg 266  Insulin running at 5 units/hr.  Per carelinnk.  Awaiting Dr. Myna Hidalgo

## 2019-01-21 NOTE — Progress Notes (Addendum)
covid 2 day test sent.  Amber Ballard covid test negative. Pt not placed on precautions at this time bc she is not symptomatic per Dr. Myna Hidalgo.

## 2019-01-21 NOTE — Progress Notes (Signed)
Dr. Myna Hidalgo called and talked to daughter to give update.

## 2019-01-21 NOTE — Progress Notes (Signed)
MD updated on pt status. "1232. Update. Pt. desated on RA to low 80's placed on 3 L now 94%. Pt. Asymptomatic." Pt. Did have mild confusion but was easily redirectable. Will continue to monitor.

## 2019-01-21 NOTE — Progress Notes (Signed)
Patient HR sustaining in high 40's to 50's after 100 mg metoprolol given at 1000. MD made aware. Baseline HR 60's to 70's. 1400 Hydralazine held per MD.

## 2019-01-21 NOTE — Progress Notes (Signed)
Spoke to Daughter [employer]. Gave her an update. Daughter educated on Heart Failure and how to better manage HF for her mother. Resources discussed. Added HF resources into [employer] for DC. Updated her that patient to transfer out of the ICU/SD. Will continue to monitor.

## 2019-01-22 DIAGNOSIS — Z8673 Personal history of transient ischemic attack (TIA), and cerebral infarction without residual deficits: Secondary | ICD-10-CM

## 2019-01-22 DIAGNOSIS — S2241XA Multiple fractures of ribs, right side, initial encounter for closed fracture: Secondary | ICD-10-CM

## 2019-01-22 LAB — BASIC METABOLIC PANEL
Anion gap: 7 (ref 5–15)
BUN: 34 mg/dL — ABNORMAL HIGH (ref 8–23)
CO2: 25 mmol/L (ref 22–32)
Calcium: 8.1 mg/dL — ABNORMAL LOW (ref 8.9–10.3)
Chloride: 109 mmol/L (ref 98–111)
Creatinine, Ser: 1.25 mg/dL — ABNORMAL HIGH (ref 0.44–1.00)
GFR calc Af Amer: 46 mL/min — ABNORMAL LOW (ref >60)
GFR calc non Af Amer: 39 mL/min — ABNORMAL LOW (ref >60)
Glucose, Bld: 136 mg/dL — ABNORMAL HIGH (ref 70–99)
Potassium: 4.3 mmol/L (ref 3.5–5.1)
Sodium: 141 mmol/L (ref 135–145)

## 2019-01-22 LAB — NOVEL CORONAVIRUS, NAA (HOSP ORDER, SEND-OUT TO REF LAB; TAT 18-24 HRS): SARS-CoV-2, NAA: NOT DETECTED

## 2019-01-22 LAB — GLUCOSE, CAPILLARY
Glucose-Capillary: 76 mg/dL (ref 70–99)
Glucose-Capillary: 240 mg/dL — ABNORMAL HIGH (ref 70–99)

## 2019-01-22 MED ORDER — FUROSEMIDE 40 MG PO TABS: 40.0000 mg | ORAL_TABLET | Freq: Every day | ORAL | 0 refills | Status: DC | PRN

## 2019-01-22 MED ORDER — METOPROLOL TARTRATE 50 MG PO TABS: 50.0000 mg | ORAL_TABLET | Freq: Two times a day (BID) | ORAL | 0 refills | Status: DC

## 2019-01-22 MED ORDER — POTASSIUM CHLORIDE CRYS ER 10 MEQ PO TBCR: 10.0000 meq | EXTENDED_RELEASE_TABLET | Freq: Every day | ORAL | 0 refills | Status: AC | PRN

## 2019-01-22 NOTE — TOC Initial Note (Signed)
Transition of Care [facility]) - Initial/Assessment Note    Patient Details  Name: Amber Ballard MRN: [id number] Date of Birth: May 31, 1932  [facility]) CM/SW Contact:    Wende Neighbors, LCSW Phone Number: 01/22/2019, 2:13 PM  Clinical Narrative:   CSW following patient for support and discharge needs. Patient lives at home with daughter. Both patient and daughter agreeable to have home health nursing. CSW has tired several agency's but is having a hard time finding agency that is in network with patients insurance                Expected Discharge Plan: [place] Barriers to Discharge: No Barriers Identified   Patient Goals and CMS Choice        Expected Discharge Plan and Services Expected Discharge Plan: [place] In-house Referral: Clinical Social Work     Living arrangements for the past 2 months: Single Family Home Expected Discharge Date: 01/22/19                         HH Arranged: (nursing)          Prior Living Arrangements/Services Living arrangements for the past 2 months: Single Family Home Lives with:: Self, Adult Children Patient language and need for interpreter reviewed:: Yes Do you feel safe going back to the place where you live?: Yes      Need for Family Participation in Patient Care: Yes (Comment) Care giver support system in place?: Yes (comment)   Criminal Activity/Legal Involvement Pertinent to Current Situation/Hospitalization: No - Comment as needed  Activities of Daily Living Home Assistive Devices/Equipment: Eyeglasses, Walker (specify type) ADL Screening (condition at time of admission) Patient's cognitive ability adequate to safely complete daily activities?: Yes Is the patient deaf or have difficulty hearing?: Yes Does the patient have difficulty seeing, even when wearing glasses/contacts?: No Does the patient have difficulty concentrating, remembering, or making decisions?: Yes Patient  able to express need for assistance with ADLs?: Yes Does the patient have difficulty dressing or bathing?: No Independently performs ADLs?: No Does the patient have difficulty walking or climbing stairs?: Yes Weakness of Legs: Both Weakness of Arms/Hands: None  Permission Sought/Granted Permission sought to share information with : Family Supports Permission granted to share information with : Yes, Verbal Permission Granted  Share Information with NAME: Amber Ballard     Permission granted to share info w Relationship: [phone]  Permission granted to share info w Contact Information: daughter  Emotional Assessment Appearance:: Appears stated age Attitude/Demeanor/Rapport: Engaged Affect (typically observed): Accepting Orientation: : Oriented to Self, Oriented to Place, Oriented to  Time, Oriented to Situation, Fluctuating Orientation (Suspected and/or reported Sundowners) Alcohol / Substance Use: Not Applicable Psych Involvement: No (comment)  Admission diagnosis:  Diabetic acidosis ([place]) [E11.10] Renal insufficiency [N28.9] Hyperkalemia [E87.5] Patient Active Problem List   Diagnosis Date Noted  . Lung nodule 01/21/2019  . DKA (diabetic ketoacidoses) ([place]) 01/20/2019  . Multiple fractures of ribs, right side, initial encounter for closed fracture 01/20/2019  . CHF, acute on chronic ([place]) 01/20/2019  . Unspecified atrial fibrillation ([place]) 01/20/2019  . History of CVA (cerebrovascular accident) 01/20/2019  . Hypothyroidism    PCP:  Ocie Doyne., MD Pharmacy:   [place], [place] [address] Phone: [phone] Fax: [phone]     Social Determinants of Health (SDOH) Interventions    Readmission Risk Interventions No  flowsheet data found.

## 2019-01-22 NOTE — Plan of Care (Signed)
Pt's BS has remained below 225. She has only had two readings that were > 200. 15 units of Levemir administered @  Pt needed to have O2 administered in order to keep O2 > 90. She is on 3 LPM now and is at 93%.

## 2019-01-22 NOTE — Discharge Summary (Addendum)
Physician Discharge Summary  Amber Ballard PQD:[id number] DOB: 31-Jan-1932 DOA: 01/20/2019  PCP: Ocie Doyne., MD  Admit date: 01/20/2019 Discharge date: 01/22/2019  Admitted From: Home  Disposition:  Home   Recommendations for Outpatient Follow-up and new medication changes:  1. Follow up with Dr. Micheal Likens in 7 days.  2. Patient will continue insulin regimen.  3. Metoprolol has been reduced to 50 mg bid to prevent bradycardia.  4. Furosemide has been changed to take only as needed for signs of volume overload, 3 lbs in 24 h or 5 lbs in 7 days.   Home Health: no   Equipment/Devices: no    Discharge Condition: stable  CODE STATUS: dnr  Diet recommendation: heart healthy and diabetic prudent.  Brief/Interim Summary: 85 year old female who presented after sustaining a fall.  She does have significant past medical history for type 2 diabetes mellitus, history of CVA, and hypothyroidism.  She reported hyperglycemia for 24 hours, she sustained a mechanical fall while standing up from the commode, positive head trauma on the right side, no loss of consciousness.  Apparently her insulin pen was not working properly.  On her initial physical examination she was hemodynamically stable, moist mucous membranes, lungs clear to auscultation bilaterally, heart S1-S2 present and rhythmic, abdomen soft, trace bilateral lower extremity edema.  Sodium 139, potassium 3.0, chloride 103, bicarb 21, glucose 185, BUN 53, creatinine 1.78, anion gap 15, white count 16.0, hemoglobin 9.4, hematocrit 29.7, platelets 275.  Her hemoglobin A1c 9.5  Patient was admitted to the hospital with a working diagnosis of diabetes ketoacidosis.  1.  Type 2 diabetes mellitus complicated with diabetes ketoacidosis.  Patient was admitted to the stepdown unit, she received IV insulin with good toleration, her anion gap closed, her diet was advanced and she was successfully transitioned to subcutaneous insulin.  She received 15 units of  insulin Levemir qhs, along with insulin sliding scale, her fasting glucose at discharge is 136 mg/dl.  She will require close follow-up on her capillary glucose at home.  Her hemoglobin A1c is 9.5.  2.  Acute kidney injury on CKD stage 2 with hypokalemia.  Patient received intravenous fluids with improvement of her kidney function, potassium was corrected with potassium chloride.  At her discharge her serum creatinine is 1.25, potassium 4.3, and serum bicarbonate of 25.  I suspect she does have chronic kidney disease, follow-up kidney function in 7 days.  3.  Paroxysmal of atrial fibrillation, continue rate control with metoprolol, her dose was reduced to 50 mg twice daily to prevent bradycardia, continue dual antiplatelet therapy with aspirin and clopidogrel.  4.  Diastolic heart failure.  It remained stable but no signs of exacerbation, patient will continue taking furosemide only as needed for signs of volume overload, continue losartan and hydralazine.  5.  Hypertension.  Continue losartan, hydralazine and amlodipine.  6.  Hypothyroid.  Continue levothyroxine.   Discharge Diagnoses:  Principal Problem:   DKA (diabetic ketoacidoses) ([place]) Active Problems:   Multiple fractures of ribs, right side, initial encounter for closed fracture   CHF, acute on chronic ([facility])   Unspecified atrial fibrillation ([facility])   Hypothyroidism   History of CVA (cerebrovascular accident)   Lung nodule    Discharge Instructions   Allergies as of 01/22/2019   No Known Allergies     Medication List    TAKE these medications   amLODipine 10 MG tablet Commonly known as: NORVASC Take 10 mg by mouth daily.   aspirin 81 MG tablet Take 1  tablet (81 mg total) by mouth daily.   atorvastatin 80 MG tablet Commonly known as: LIPITOR Take 80 mg by mouth daily at 6 PM.   clopidogrel 75 MG tablet Commonly known as: PLAVIX Take 75 mg by mouth daily.   FIBER LAXATIVE PO Take 1 tablet by mouth daily as  needed.   furosemide 40 MG tablet Commonly known as: LASIX Take 1 tablet (40 mg total) by mouth daily as needed for fluid or edema (as needed for weight gain 3 lbs in 24 hours or 5 lbs in 7 days.). What changed:   when to take this  reasons to take this   hydrALAZINE 100 MG tablet Commonly known as: APRESOLINE Take 100 mg by mouth every 8 (eight) hours.   insulin aspart 100 UNIT/ML injection Commonly known as: novoLOG Inject 0-14 Units into the skin 3 (three) times daily with meals. Sliding scale per Dr. Micheal Likens   insulin detemir 100 UNIT/ML injection Commonly known as: LEVEMIR Inject 15 Units into the skin at bedtime. What changed:   how much to take  when to take this   levothyroxine 75 MCG tablet Commonly known as: SYNTHROID Take 75 mcg by mouth daily before breakfast.   losartan 100 MG tablet Commonly known as: COZAAR Take 100 mg by mouth daily.   Magnesium 400 MG Tabs Take 400 mg by mouth 2 (two) times daily.   meclizine 25 MG tablet Commonly known as: ANTIVERT Take 25 mg by mouth 3 (three) times daily as needed for dizziness.   metoprolol tartrate 50 MG tablet Commonly known as: LOPRESSOR Take 1 tablet (50 mg total) by mouth 2 (two) times daily for 30 days. What changed:   medication strength  how much to take   potassium chloride 10 MEQ tablet Commonly known as: K-DUR Take 1 tablet (10 mEq total) by mouth daily as needed (Take only with furosemide.). What changed:   when to take this  reasons to take this   senna 8.6 MG Tabs tablet Commonly known as: SENOKOT Take 2 tablets (17.2 mg total) by mouth 2 (two) times daily. What changed:   when to take this  reasons to take this   vitamin B-12 1000 MCG tablet Commonly known as: CYANOCOBALAMIN Take 1,000 mcg by mouth daily.   Vitamin D (Ergocalciferol) 1.25 MG (50000 UT) Caps capsule Commonly known as: DRISDOL Take 50,000 Units by mouth every 7 (seven) days.       No Known  Allergies  Consultations:     Procedures/Studies:  No results found.   Procedures:   Subjective: Patient is feeling better, no nausea or vomiting, no chest pain or dyspnea.   Discharge Exam: Vitals:   01/22/19 0054 01/22/19 0616  BP: (!) 137/54 (!) 166/60  Pulse: 61 60  Resp: 18 18  Temp: 98.7 F (37.1 C) 98.4 F (36.9 C)  SpO2: 96% 98%   Vitals:   01/21/19 1600 01/21/19 2033 01/22/19 0054 01/22/19 0616  BP: 131/63 (!) 163/50 (!) 137/54 (!) 166/60  Pulse: (!) 50 63 61 60  Resp: 19 (!) 24 18 18   Temp:  98.1 F (36.7 C) 98.7 F (37.1 C) 98.4 F (36.9 C)  TempSrc:  Oral Oral Oral  SpO2: 95% 96% 96% 98%  Weight:      Height:        General: Not in pain or dyspnea  Neurology: Awake and alert, non focal  E ENT: no pallor, no icterus, oral mucosa moist Cardiovascular: No JVD. S1-S2 present, rhythmic,  no gallops, rubs, or murmurs. No lower extremity edema. Pulmonary: positive breath sounds bilaterally, no wheezing, rhonchi or rales. Gastrointestinal. Abdomen with no organomegaly, non tender, no rebound or guarding Skin. No rashes Musculoskeletal: no joint deformities   The results of significant diagnostics from this hospitalization (including imaging, microbiology, ancillary and laboratory) are listed below for reference.     Microbiology: Recent Results (from the past 240 hour(s))  Surgical PCR screen     Status: None   Collection Time: 01/21/19  3:28 AM   Specimen: Nasal Mucosa; Nasal Swab  Result Value Ref Range Status   MRSA, PCR NEGATIVE NEGATIVE Final   Staphylococcus aureus NEGATIVE NEGATIVE Final    Comment: (NOTE) The Xpert SA Assay (FDA approved for NASAL specimens in patients 50 years of age and older), is one component of a comprehensive surveillance program. It is not intended to diagnose infection nor to guide or monitor treatment. Performed at [facility], [place] [address] [place], [place] [address]      Labs: BNP  (last 3 results) No results for input(s): BNP in the last 8760 hours. Basic Metabolic Panel: Recent Labs  Lab 01/20/19 2348 01/21/19 0356 01/21/19 1636 01/22/19 0430  NA 139 138 138 141  K 3.0* 3.4* 4.8 4.3  CL 103 104 105 109  CO2 21* 21* 23 25  GLUCOSE 185* 170* 167* 136*  BUN 53* 49* 46* 34*  CREATININE 1.78* 1.59* 1.57* 1.25*  CALCIUM 8.1* 7.8* 8.0* 8.1*   Liver Function Tests: No results for input(s): AST, ALT, ALKPHOS, BILITOT, PROT, ALBUMIN in the last 168 hours. No results for input(s): LIPASE, AMYLASE in the last 168 hours. No results for input(s): AMMONIA in the last 168 hours. CBC: Recent Labs  Lab 01/21/19 0356  WBC 16.0*  NEUTROABS 11.4*  HGB 9.4*  HCT 29.7*  MCV 96.7  PLT 275   Cardiac Enzymes: No results for input(s): CKTOTAL, CKMB, CKMBINDEX, TROPONINI in the last 168 hours. BNP: Invalid input(s): POCBNP CBG: Recent Labs  Lab 01/21/19 0852 01/21/19 1217 01/21/19 1612 01/21/19 2215 01/22/19 0735  GLUCAP 104* 212* 165* 224* 76   D-Dimer No results for input(s): DDIMER in the last 72 hours. Hgb A1c Recent Labs    01/21/19 0356  HGBA1C 9.5*   Lipid Profile No results for input(s): CHOL, HDL, LDLCALC, TRIG, CHOLHDL, LDLDIRECT in the last 72 hours. Thyroid function studies Recent Labs    01/20/19 2348  TSH 1.969   Anemia work up No results for input(s): VITAMINB12, FOLATE, FERRITIN, TIBC, IRON, RETICCTPCT in the last 72 hours. Urinalysis No results found for: COLORURINE, APPEARANCEUR, [place], [place], GLUCOSEU, [place], [place], [place], PROTEINUR, UROBILINOGEN, NITRITE, LEUKOCYTESUR Sepsis Labs Invalid input(s): PROCALCITONIN,  WBC,  LACTICIDVEN Microbiology Recent Results (from the past 240 hour(s))  Surgical PCR screen     Status: None   Collection Time: 01/21/19  3:28 AM   Specimen: Nasal Mucosa; Nasal Swab  Result Value Ref Range Status   MRSA, PCR NEGATIVE NEGATIVE Final   Staphylococcus aureus NEGATIVE NEGATIVE Final     Comment: (NOTE) The Xpert SA Assay (FDA approved for NASAL specimens in patients 18 years of age and older), is one component of a comprehensive surveillance program. It is not intended to diagnose infection nor to guide or monitor treatment. Performed at [facility], [place] [address] [place], [place] [address]      Time coordinating discharge: 45 minutes  SIGNED:   Tawni Millers, MD  [facility] 01/22/2019, 10:17  AM

## 2019-01-22 NOTE — Evaluation (Signed)
Physical Therapy One Time Evaluation Patient Details Name: Amber Ballard MRN: [id number] DOB: [birth year] Today's Date: 01/22/2019   History of Present Illness  Pt is an 85 year old female admitted for DKA with significant past medical history for type 2 diabetes mellitus, history of CVA, and hypothyroidism  Clinical Impression  Patient evaluated by Physical Therapy with no further acute PT needs identified. All education has been completed and the patient has no further questions. Pt ambulated a few times around her room without any difficulty, using RW which is her baseline. Pt denies any symptoms and SpO2 93% on room air ambulating. See below for any follow-up Physical Therapy or equipment needs. PT is signing off. Thank you for this referral.     Follow Up Recommendations No PT follow up;Supervision - Intermittent    Equipment Recommendations  None recommended by PT    Recommendations for Other Services       Precautions / Restrictions Precautions Precautions: Fall      Mobility  Bed Mobility               General bed mobility comments: pt up in recliner on arrival  Transfers Overall transfer level: Needs assistance Equipment used: Rolling walker (2 wheeled) Transfers: Sit to/from Stand Sit to Stand: Min guard;Supervision         General transfer comment: utilizes UE for support/assist  Ambulation/Gait Ambulation/Gait assistance: Min Gaffer (Feet): 40 Feet(x3) Assistive device: Rolling walker (2 wheeled) Gait Pattern/deviations: Step-through pattern;Decreased stride length     General Gait Details: pt steady with RW and ambulated around her room, Spo2 93% on room air during ambulation  Stairs            Wheelchair Mobility    Modified Rankin (Stroke Patients Only)       Balance Overall balance assessment: History of Falls                                           Pertinent Vitals/Pain Pain  Assessment: No/denies pain    Home Living Family/patient expects to be discharged to:: Private residence Living Arrangements: Children   Type of Home: House Home Access: Stairs to enter   Technical brewer of Steps: 1 Home Layout: One level Home Equipment: Environmental consultant - 2 wheels;Cane - single point      Prior Function Level of Independence: Independent with assistive device(s)               Hand Dominance        Extremity/Trunk Assessment        Lower Extremity Assessment Lower Extremity Assessment: Overall WFL for tasks assessed       Communication   Communication: No difficulties  Cognition Arousal/Alertness: Awake/alert Behavior During Therapy: WFL for tasks assessed/performed Overall Cognitive Status: Within Functional Limits for tasks assessed                                        General Comments      Exercises     Assessment/Plan    PT Assessment Patent does not need any further PT services  PT Problem List         PT Treatment Interventions      PT Goals (Current goals can be found in the Care Plan section)  Acute Rehab PT Goals PT Goal Formulation: All assessment and education complete, DC therapy    Frequency     Barriers to discharge        Co-evaluation               AM-PAC PT "6 Clicks" Mobility  Outcome Measure Help needed turning from your back to your side while in a flat bed without using bedrails?: None Help needed moving from lying on your back to sitting on the side of a flat bed without using bedrails?: None Help needed moving to and from a bed to a chair (including a wheelchair)?: A Little Help needed standing up from a chair using your arms (e.g., wheelchair or bedside chair)?: A Little Help needed to walk in hospital room?: A Little Help needed climbing 3-5 steps with a railing? : A Little 6 Click Score: 20    End of Session Equipment Utilized During Treatment: Gait belt Activity  Tolerance: Patient tolerated treatment well Patient left: in chair;with call bell/phone within reach   PT Visit Diagnosis: Difficulty in walking, not elsewhere classified (R26.2)    Time: 5852-7782 PT Time Calculation (min) (ACUTE ONLY): 11 min   Charges:   PT Evaluation $PT Eval Low Complexity: [place], PT, DPT Acute Rehabilitation Services Office: [phone] Pager: [phone]   Trena Platt 01/22/2019, 12:39 PM

## 2019-01-22 NOTE — TOC Transition Note (Signed)
Transition of Care [facility]) - CM/SW Discharge Note   Patient Details  Name: Amber Ballard MRN: [id number] Date of Birth: [birth year]  Transition of Care [facility]) CM/SW Contact:  Dessa Phi, RN Phone Number: 01/22/2019, 2:52 PM   Clinical Narrative:  Faxed w/confirmation to Dr. Winona Legato to [place] [phone]/fax#[phone] for pcp appt/faxed d/c summary. No further CM needs.     Final next level of care: Home w Home Health Services Barriers to Discharge: No Barriers Identified   Patient Goals and CMS Choice        Discharge Placement                       Discharge Plan and Services In-house Referral: Clinical Social Work                        HH Arranged: (nursing)          Social Determinants of Health (SDOH) Interventions     Readmission Risk Interventions No flowsheet data found.

## 2019-01-22 NOTE — TOC Transition Note (Addendum)
Transition of Care [facility]) - CM/SW Discharge Note   Patient Details  Name: Amber Ballard MRN: [id number] Date of Birth: 02/02/1932  [facility]) CM/SW Contact:  Wende Neighbors, LCSW Phone Number: 01/22/2019, 3:02 PM   Clinical Narrative:  Patient daughter will pick patient up from hospital. Patient will be followed by [facility] for Nursing needs. Information has been faxed to [facility] from [place] stated she received it and will follow patient for home health needs     Final next level of care: Home w Home Health Services Barriers to Discharge: No Barriers Identified   Patient Goals and CMS Choice        Discharge Placement                Patient to be transferred to facility by: daughter Name of family member notified: daughter Patient and family notified of of transfer: 01/22/19  Discharge Plan and Services In-house Referral: Clinical Social Work                        HH Arranged: (nursing)          Social Determinants of Health (SDOH) Interventions     Readmission Risk Interventions No flowsheet data found.

## 2019-01-22 NOTE — Care Management Important Message (Signed)
Important Message  Patient Details  Name: Amber Ballard MRN: [id number] Date of Birth: 08-23-1931   Medicare Important Message Given:  Yes. CMA printed out the IM for Case Manager or LCSW to give to the patient.      Esti Demello 01/22/2019, 8:39 AM

## 2019-01-26 DIAGNOSIS — E785 Hyperlipidemia, unspecified: Secondary | ICD-10-CM | POA: Diagnosis not present

## 2019-01-26 DIAGNOSIS — Z6822 Body mass index (BMI) 22.0-22.9, adult: Secondary | ICD-10-CM | POA: Diagnosis not present

## 2019-01-26 DIAGNOSIS — I50810 Right heart failure, unspecified: Secondary | ICD-10-CM | POA: Diagnosis not present

## 2019-01-26 DIAGNOSIS — I10 Essential (primary) hypertension: Secondary | ICD-10-CM | POA: Diagnosis not present

## 2019-01-26 DIAGNOSIS — E039 Hypothyroidism, unspecified: Secondary | ICD-10-CM | POA: Diagnosis not present

## 2019-01-26 DIAGNOSIS — E559 Vitamin D deficiency, unspecified: Secondary | ICD-10-CM | POA: Diagnosis not present

## 2019-01-26 DIAGNOSIS — E118 Type 2 diabetes mellitus with unspecified complications: Secondary | ICD-10-CM | POA: Diagnosis not present

## 2019-01-26 DIAGNOSIS — Z853 Personal history of malignant neoplasm of breast: Secondary | ICD-10-CM | POA: Diagnosis not present

## 2019-01-26 DIAGNOSIS — E538 Deficiency of other specified B group vitamins: Secondary | ICD-10-CM | POA: Diagnosis not present

## 2019-02-01 DIAGNOSIS — R262 Difficulty in walking, not elsewhere classified: Secondary | ICD-10-CM | POA: Diagnosis not present

## 2019-02-02 DIAGNOSIS — E039 Hypothyroidism, unspecified: Secondary | ICD-10-CM | POA: Diagnosis not present

## 2019-02-02 DIAGNOSIS — Z8673 Personal history of transient ischemic attack (TIA), and cerebral infarction without residual deficits: Secondary | ICD-10-CM | POA: Diagnosis not present

## 2019-02-02 DIAGNOSIS — I10 Essential (primary) hypertension: Secondary | ICD-10-CM | POA: Diagnosis not present

## 2019-02-02 DIAGNOSIS — E785 Hyperlipidemia, unspecified: Secondary | ICD-10-CM | POA: Diagnosis not present

## 2019-02-02 DIAGNOSIS — E538 Deficiency of other specified B group vitamins: Secondary | ICD-10-CM | POA: Diagnosis not present

## 2019-02-02 DIAGNOSIS — R21 Rash and other nonspecific skin eruption: Secondary | ICD-10-CM | POA: Diagnosis not present

## 2019-02-02 DIAGNOSIS — E559 Vitamin D deficiency, unspecified: Secondary | ICD-10-CM | POA: Diagnosis not present

## 2019-02-02 DIAGNOSIS — I50810 Right heart failure, unspecified: Secondary | ICD-10-CM | POA: Diagnosis not present

## 2019-02-02 DIAGNOSIS — E118 Type 2 diabetes mellitus with unspecified complications: Secondary | ICD-10-CM | POA: Diagnosis not present

## 2019-02-05 DIAGNOSIS — H35363 Drusen (degenerative) of macula, bilateral: Secondary | ICD-10-CM | POA: Diagnosis not present

## 2019-02-05 DIAGNOSIS — E113393 Type 2 diabetes mellitus with moderate nonproliferative diabetic retinopathy without macular edema, bilateral: Secondary | ICD-10-CM | POA: Diagnosis not present

## 2019-02-05 DIAGNOSIS — H43813 Vitreous degeneration, bilateral: Secondary | ICD-10-CM | POA: Diagnosis not present

## 2019-02-05 DIAGNOSIS — H47232 Glaucomatous optic atrophy, left eye: Secondary | ICD-10-CM | POA: Diagnosis not present

## 2019-02-09 DIAGNOSIS — E039 Hypothyroidism, unspecified: Secondary | ICD-10-CM | POA: Diagnosis not present

## 2019-02-09 DIAGNOSIS — I10 Essential (primary) hypertension: Secondary | ICD-10-CM | POA: Diagnosis not present

## 2019-02-09 DIAGNOSIS — E118 Type 2 diabetes mellitus with unspecified complications: Secondary | ICD-10-CM | POA: Diagnosis not present

## 2019-02-09 DIAGNOSIS — I50810 Right heart failure, unspecified: Secondary | ICD-10-CM | POA: Diagnosis not present

## 2019-02-09 DIAGNOSIS — Z8673 Personal history of transient ischemic attack (TIA), and cerebral infarction without residual deficits: Secondary | ICD-10-CM | POA: Diagnosis not present

## 2019-02-09 DIAGNOSIS — E785 Hyperlipidemia, unspecified: Secondary | ICD-10-CM | POA: Diagnosis not present

## 2019-02-23 DIAGNOSIS — E538 Deficiency of other specified B group vitamins: Secondary | ICD-10-CM | POA: Diagnosis not present

## 2019-02-23 DIAGNOSIS — R413 Other amnesia: Secondary | ICD-10-CM | POA: Diagnosis not present

## 2019-02-23 DIAGNOSIS — E118 Type 2 diabetes mellitus with unspecified complications: Secondary | ICD-10-CM | POA: Diagnosis not present

## 2019-02-23 DIAGNOSIS — Z8673 Personal history of transient ischemic attack (TIA), and cerebral infarction without residual deficits: Secondary | ICD-10-CM | POA: Diagnosis not present

## 2019-02-23 DIAGNOSIS — I50810 Right heart failure, unspecified: Secondary | ICD-10-CM | POA: Diagnosis not present

## 2019-02-23 DIAGNOSIS — Z853 Personal history of malignant neoplasm of breast: Secondary | ICD-10-CM | POA: Diagnosis not present

## 2019-02-23 DIAGNOSIS — I10 Essential (primary) hypertension: Secondary | ICD-10-CM | POA: Diagnosis not present

## 2019-02-23 DIAGNOSIS — E559 Vitamin D deficiency, unspecified: Secondary | ICD-10-CM | POA: Diagnosis not present

## 2019-03-25 DIAGNOSIS — I50810 Right heart failure, unspecified: Secondary | ICD-10-CM | POA: Diagnosis not present

## 2019-03-25 DIAGNOSIS — R413 Other amnesia: Secondary | ICD-10-CM | POA: Diagnosis not present

## 2019-03-25 DIAGNOSIS — Z8673 Personal history of transient ischemic attack (TIA), and cerebral infarction without residual deficits: Secondary | ICD-10-CM | POA: Diagnosis not present

## 2019-03-25 DIAGNOSIS — E039 Hypothyroidism, unspecified: Secondary | ICD-10-CM | POA: Diagnosis not present

## 2019-03-25 DIAGNOSIS — I10 Essential (primary) hypertension: Secondary | ICD-10-CM | POA: Diagnosis not present

## 2019-03-25 DIAGNOSIS — E785 Hyperlipidemia, unspecified: Secondary | ICD-10-CM | POA: Diagnosis not present

## 2019-03-25 DIAGNOSIS — F329 Major depressive disorder, single episode, unspecified: Secondary | ICD-10-CM | POA: Diagnosis not present

## 2019-03-25 DIAGNOSIS — E559 Vitamin D deficiency, unspecified: Secondary | ICD-10-CM | POA: Diagnosis not present

## 2019-03-25 DIAGNOSIS — E538 Deficiency of other specified B group vitamins: Secondary | ICD-10-CM | POA: Diagnosis not present

## 2019-03-25 DIAGNOSIS — E118 Type 2 diabetes mellitus with unspecified complications: Secondary | ICD-10-CM | POA: Diagnosis not present

## 2019-04-08 DIAGNOSIS — E039 Hypothyroidism, unspecified: Secondary | ICD-10-CM | POA: Diagnosis not present

## 2019-04-08 DIAGNOSIS — E119 Type 2 diabetes mellitus without complications: Secondary | ICD-10-CM | POA: Diagnosis not present

## 2019-04-08 DIAGNOSIS — R413 Other amnesia: Secondary | ICD-10-CM | POA: Diagnosis not present

## 2019-04-08 DIAGNOSIS — I50810 Right heart failure, unspecified: Secondary | ICD-10-CM | POA: Diagnosis not present

## 2019-04-08 DIAGNOSIS — I10 Essential (primary) hypertension: Secondary | ICD-10-CM | POA: Diagnosis not present

## 2019-04-08 DIAGNOSIS — Z8673 Personal history of transient ischemic attack (TIA), and cerebral infarction without residual deficits: Secondary | ICD-10-CM | POA: Diagnosis not present

## 2019-04-08 DIAGNOSIS — F329 Major depressive disorder, single episode, unspecified: Secondary | ICD-10-CM | POA: Diagnosis not present

## 2019-04-08 DIAGNOSIS — E785 Hyperlipidemia, unspecified: Secondary | ICD-10-CM | POA: Diagnosis not present

## 2019-05-10 DIAGNOSIS — E119 Type 2 diabetes mellitus without complications: Secondary | ICD-10-CM | POA: Diagnosis not present

## 2019-05-10 DIAGNOSIS — F329 Major depressive disorder, single episode, unspecified: Secondary | ICD-10-CM | POA: Diagnosis not present

## 2019-05-10 DIAGNOSIS — E7140 Disorder of carnitine metabolism, unspecified: Secondary | ICD-10-CM | POA: Diagnosis not present

## 2019-05-10 DIAGNOSIS — G4700 Insomnia, unspecified: Secondary | ICD-10-CM | POA: Diagnosis not present

## 2019-05-10 DIAGNOSIS — E039 Hypothyroidism, unspecified: Secondary | ICD-10-CM | POA: Diagnosis not present

## 2019-05-10 DIAGNOSIS — I10 Essential (primary) hypertension: Secondary | ICD-10-CM | POA: Diagnosis not present

## 2019-05-10 DIAGNOSIS — Z6824 Body mass index (BMI) 24.0-24.9, adult: Secondary | ICD-10-CM | POA: Diagnosis not present

## 2019-05-10 DIAGNOSIS — Z79899 Other long term (current) drug therapy: Secondary | ICD-10-CM | POA: Diagnosis not present

## 2019-05-10 DIAGNOSIS — Z23 Encounter for immunization: Secondary | ICD-10-CM | POA: Diagnosis not present

## 2019-05-10 DIAGNOSIS — Z8673 Personal history of transient ischemic attack (TIA), and cerebral infarction without residual deficits: Secondary | ICD-10-CM | POA: Diagnosis not present

## 2019-05-10 DIAGNOSIS — I50810 Right heart failure, unspecified: Secondary | ICD-10-CM | POA: Diagnosis not present

## 2019-05-10 DIAGNOSIS — E785 Hyperlipidemia, unspecified: Secondary | ICD-10-CM | POA: Diagnosis not present

## 2019-06-10 DIAGNOSIS — N183 Chronic kidney disease, stage 3 unspecified: Secondary | ICD-10-CM | POA: Diagnosis not present

## 2019-06-10 DIAGNOSIS — I10 Essential (primary) hypertension: Secondary | ICD-10-CM | POA: Diagnosis not present

## 2019-06-10 DIAGNOSIS — J189 Pneumonia, unspecified organism: Secondary | ICD-10-CM | POA: Diagnosis not present

## 2019-06-10 DIAGNOSIS — E538 Deficiency of other specified B group vitamins: Secondary | ICD-10-CM | POA: Diagnosis not present

## 2019-06-10 DIAGNOSIS — G4700 Insomnia, unspecified: Secondary | ICD-10-CM | POA: Diagnosis not present

## 2019-06-10 DIAGNOSIS — J301 Allergic rhinitis due to pollen: Secondary | ICD-10-CM | POA: Diagnosis not present

## 2019-06-10 DIAGNOSIS — Z8673 Personal history of transient ischemic attack (TIA), and cerebral infarction without residual deficits: Secondary | ICD-10-CM | POA: Diagnosis not present

## 2019-06-10 DIAGNOSIS — E559 Vitamin D deficiency, unspecified: Secondary | ICD-10-CM | POA: Diagnosis not present

## 2019-06-10 DIAGNOSIS — F329 Major depressive disorder, single episode, unspecified: Secondary | ICD-10-CM | POA: Diagnosis not present

## 2019-06-10 DIAGNOSIS — E039 Hypothyroidism, unspecified: Secondary | ICD-10-CM | POA: Diagnosis not present

## 2019-06-10 DIAGNOSIS — E119 Type 2 diabetes mellitus without complications: Secondary | ICD-10-CM | POA: Diagnosis not present

## 2019-06-10 DIAGNOSIS — I50810 Right heart failure, unspecified: Secondary | ICD-10-CM | POA: Diagnosis not present

## 2019-06-16 DIAGNOSIS — J029 Acute pharyngitis, unspecified: Secondary | ICD-10-CM | POA: Diagnosis not present

## 2019-06-16 DIAGNOSIS — J069 Acute upper respiratory infection, unspecified: Secondary | ICD-10-CM | POA: Diagnosis not present

## 2019-06-16 DIAGNOSIS — B349 Viral infection, unspecified: Secondary | ICD-10-CM | POA: Diagnosis not present

## 2019-07-01 DIAGNOSIS — Z79899 Other long term (current) drug therapy: Secondary | ICD-10-CM | POA: Diagnosis not present

## 2019-07-01 DIAGNOSIS — I129 Hypertensive chronic kidney disease with stage 1 through stage 4 chronic kidney disease, or unspecified chronic kidney disease: Secondary | ICD-10-CM | POA: Diagnosis not present

## 2019-07-01 DIAGNOSIS — J952 Acute pulmonary insufficiency following nonthoracic surgery: Secondary | ICD-10-CM | POA: Diagnosis not present

## 2019-07-01 DIAGNOSIS — S72001A Fracture of unspecified part of neck of right femur, initial encounter for closed fracture: Secondary | ICD-10-CM | POA: Diagnosis not present

## 2019-07-01 DIAGNOSIS — E119 Type 2 diabetes mellitus without complications: Secondary | ICD-10-CM | POA: Diagnosis not present

## 2019-07-01 DIAGNOSIS — S72001D Fracture of unspecified part of neck of right femur, subsequent encounter for closed fracture with routine healing: Secondary | ICD-10-CM | POA: Diagnosis not present

## 2019-07-01 DIAGNOSIS — Z794 Long term (current) use of insulin: Secondary | ICD-10-CM | POA: Diagnosis not present

## 2019-07-01 DIAGNOSIS — D72829 Elevated white blood cell count, unspecified: Secondary | ICD-10-CM | POA: Diagnosis not present

## 2019-07-01 DIAGNOSIS — J9601 Acute respiratory failure with hypoxia: Secondary | ICD-10-CM | POA: Diagnosis not present

## 2019-07-01 DIAGNOSIS — S72009A Fracture of unspecified part of neck of unspecified femur, initial encounter for closed fracture: Secondary | ICD-10-CM | POA: Diagnosis not present

## 2019-07-01 DIAGNOSIS — M898X8 Other specified disorders of bone, other site: Secondary | ICD-10-CM | POA: Diagnosis not present

## 2019-07-01 DIAGNOSIS — E1122 Type 2 diabetes mellitus with diabetic chronic kidney disease: Secondary | ICD-10-CM | POA: Diagnosis not present

## 2019-07-01 DIAGNOSIS — S79921A Unspecified injury of right thigh, initial encounter: Secondary | ICD-10-CM | POA: Diagnosis not present

## 2019-07-01 DIAGNOSIS — E039 Hypothyroidism, unspecified: Secondary | ICD-10-CM | POA: Diagnosis not present

## 2019-07-01 DIAGNOSIS — E785 Hyperlipidemia, unspecified: Secondary | ICD-10-CM | POA: Diagnosis not present

## 2019-07-01 DIAGNOSIS — I10 Essential (primary) hypertension: Secondary | ICD-10-CM | POA: Diagnosis not present

## 2019-07-01 DIAGNOSIS — M84651A Pathological fracture in other disease, right femur, initial encounter for fracture: Secondary | ICD-10-CM | POA: Diagnosis not present

## 2019-07-01 DIAGNOSIS — E1165 Type 2 diabetes mellitus with hyperglycemia: Secondary | ICD-10-CM | POA: Diagnosis not present

## 2019-07-01 DIAGNOSIS — I4510 Unspecified right bundle-branch block: Secondary | ICD-10-CM | POA: Diagnosis not present

## 2019-07-01 DIAGNOSIS — D62 Acute posthemorrhagic anemia: Secondary | ICD-10-CM | POA: Diagnosis not present

## 2019-07-01 DIAGNOSIS — Z8673 Personal history of transient ischemic attack (TIA), and cerebral infarction without residual deficits: Secondary | ICD-10-CM | POA: Diagnosis not present

## 2019-07-01 DIAGNOSIS — R52 Pain, unspecified: Secondary | ICD-10-CM | POA: Diagnosis not present

## 2019-07-01 DIAGNOSIS — M25551 Pain in right hip: Secondary | ICD-10-CM | POA: Diagnosis not present

## 2019-07-01 DIAGNOSIS — N183 Chronic kidney disease, stage 3 unspecified: Secondary | ICD-10-CM | POA: Diagnosis not present

## 2019-07-01 DIAGNOSIS — R0902 Hypoxemia: Secondary | ICD-10-CM | POA: Diagnosis not present

## 2019-07-01 DIAGNOSIS — Z7902 Long term (current) use of antithrombotics/antiplatelets: Secondary | ICD-10-CM | POA: Diagnosis not present

## 2019-07-01 DIAGNOSIS — S299XXA Unspecified injury of thorax, initial encounter: Secondary | ICD-10-CM | POA: Diagnosis not present

## 2019-07-01 DIAGNOSIS — E8809 Other disorders of plasma-protein metabolism, not elsewhere classified: Secondary | ICD-10-CM | POA: Diagnosis not present

## 2019-07-01 DIAGNOSIS — S72041A Displaced fracture of base of neck of right femur, initial encounter for closed fracture: Secondary | ICD-10-CM | POA: Diagnosis not present

## 2019-07-01 DIAGNOSIS — M84753A Incomplete atypical femoral fracture, unspecified leg, initial encounter for fracture: Secondary | ICD-10-CM | POA: Diagnosis not present

## 2019-07-01 DIAGNOSIS — J90 Pleural effusion, not elsewhere classified: Secondary | ICD-10-CM | POA: Diagnosis not present

## 2019-07-01 DIAGNOSIS — Z87891 Personal history of nicotine dependence: Secondary | ICD-10-CM | POA: Diagnosis not present

## 2019-07-01 DIAGNOSIS — E8889 Other specified metabolic disorders: Secondary | ICD-10-CM | POA: Diagnosis not present

## 2019-07-09 DIAGNOSIS — Z7902 Long term (current) use of antithrombotics/antiplatelets: Secondary | ICD-10-CM | POA: Diagnosis not present

## 2019-07-09 DIAGNOSIS — I129 Hypertensive chronic kidney disease with stage 1 through stage 4 chronic kidney disease, or unspecified chronic kidney disease: Secondary | ICD-10-CM | POA: Diagnosis not present

## 2019-07-09 DIAGNOSIS — Z8673 Personal history of transient ischemic attack (TIA), and cerebral infarction without residual deficits: Secondary | ICD-10-CM | POA: Diagnosis not present

## 2019-07-09 DIAGNOSIS — E11649 Type 2 diabetes mellitus with hypoglycemia without coma: Secondary | ICD-10-CM | POA: Diagnosis not present

## 2019-07-09 DIAGNOSIS — I130 Hypertensive heart and chronic kidney disease with heart failure and stage 1 through stage 4 chronic kidney disease, or unspecified chronic kidney disease: Secondary | ICD-10-CM | POA: Diagnosis not present

## 2019-07-09 DIAGNOSIS — Z853 Personal history of malignant neoplasm of breast: Secondary | ICD-10-CM | POA: Diagnosis not present

## 2019-07-09 DIAGNOSIS — N183 Chronic kidney disease, stage 3 unspecified: Secondary | ICD-10-CM | POA: Diagnosis not present

## 2019-07-09 DIAGNOSIS — Z96641 Presence of right artificial hip joint: Secondary | ICD-10-CM | POA: Diagnosis not present

## 2019-07-09 DIAGNOSIS — Z7982 Long term (current) use of aspirin: Secondary | ICD-10-CM | POA: Diagnosis not present

## 2019-07-09 DIAGNOSIS — I5032 Chronic diastolic (congestive) heart failure: Secondary | ICD-10-CM | POA: Diagnosis not present

## 2019-07-09 DIAGNOSIS — E039 Hypothyroidism, unspecified: Secondary | ICD-10-CM | POA: Diagnosis not present

## 2019-07-09 DIAGNOSIS — R296 Repeated falls: Secondary | ICD-10-CM | POA: Diagnosis not present

## 2019-07-09 DIAGNOSIS — M1990 Unspecified osteoarthritis, unspecified site: Secondary | ICD-10-CM | POA: Diagnosis not present

## 2019-07-09 DIAGNOSIS — I4891 Unspecified atrial fibrillation: Secondary | ICD-10-CM | POA: Diagnosis not present

## 2019-07-09 DIAGNOSIS — E785 Hyperlipidemia, unspecified: Secondary | ICD-10-CM | POA: Diagnosis not present

## 2019-07-09 DIAGNOSIS — E1165 Type 2 diabetes mellitus with hyperglycemia: Secondary | ICD-10-CM | POA: Diagnosis not present

## 2019-07-09 DIAGNOSIS — E1122 Type 2 diabetes mellitus with diabetic chronic kidney disease: Secondary | ICD-10-CM | POA: Diagnosis not present

## 2019-07-09 DIAGNOSIS — S72001D Fracture of unspecified part of neck of right femur, subsequent encounter for closed fracture with routine healing: Secondary | ICD-10-CM | POA: Diagnosis not present

## 2019-07-09 DIAGNOSIS — J90 Pleural effusion, not elsewhere classified: Secondary | ICD-10-CM | POA: Diagnosis not present

## 2019-07-09 DIAGNOSIS — R911 Solitary pulmonary nodule: Secondary | ICD-10-CM | POA: Diagnosis not present

## 2019-07-13 DIAGNOSIS — R600 Localized edema: Secondary | ICD-10-CM | POA: Diagnosis not present

## 2019-07-13 DIAGNOSIS — Z79899 Other long term (current) drug therapy: Secondary | ICD-10-CM | POA: Diagnosis not present

## 2019-07-13 DIAGNOSIS — E119 Type 2 diabetes mellitus without complications: Secondary | ICD-10-CM | POA: Diagnosis not present

## 2019-07-13 DIAGNOSIS — M7989 Other specified soft tissue disorders: Secondary | ICD-10-CM | POA: Diagnosis not present

## 2019-07-13 DIAGNOSIS — E039 Hypothyroidism, unspecified: Secondary | ICD-10-CM | POA: Diagnosis not present

## 2019-07-13 DIAGNOSIS — I10 Essential (primary) hypertension: Secondary | ICD-10-CM | POA: Diagnosis not present

## 2019-07-13 DIAGNOSIS — N183 Chronic kidney disease, stage 3 unspecified: Secondary | ICD-10-CM | POA: Diagnosis not present

## 2019-07-13 DIAGNOSIS — Z8673 Personal history of transient ischemic attack (TIA), and cerebral infarction without residual deficits: Secondary | ICD-10-CM | POA: Diagnosis not present

## 2019-07-13 DIAGNOSIS — Z9889 Other specified postprocedural states: Secondary | ICD-10-CM | POA: Diagnosis not present

## 2019-07-13 DIAGNOSIS — F329 Major depressive disorder, single episode, unspecified: Secondary | ICD-10-CM | POA: Diagnosis not present

## 2019-07-13 DIAGNOSIS — I50810 Right heart failure, unspecified: Secondary | ICD-10-CM | POA: Diagnosis not present

## 2019-07-18 ENCOUNTER — Inpatient Hospital Stay: Admission: AD | Admit: 2019-07-18 | Payer: PPO | Source: Other Acute Inpatient Hospital | Admitting: Internal Medicine

## 2019-07-19 DIAGNOSIS — E039 Hypothyroidism, unspecified: Secondary | ICD-10-CM | POA: Diagnosis not present

## 2019-07-19 DIAGNOSIS — F329 Major depressive disorder, single episode, unspecified: Secondary | ICD-10-CM | POA: Diagnosis not present

## 2019-07-19 DIAGNOSIS — E559 Vitamin D deficiency, unspecified: Secondary | ICD-10-CM | POA: Diagnosis not present

## 2019-07-19 DIAGNOSIS — J301 Allergic rhinitis due to pollen: Secondary | ICD-10-CM | POA: Diagnosis not present

## 2019-07-19 DIAGNOSIS — E119 Type 2 diabetes mellitus without complications: Secondary | ICD-10-CM | POA: Diagnosis not present

## 2019-07-19 DIAGNOSIS — Z8673 Personal history of transient ischemic attack (TIA), and cerebral infarction without residual deficits: Secondary | ICD-10-CM | POA: Diagnosis not present

## 2019-07-19 DIAGNOSIS — Z853 Personal history of malignant neoplasm of breast: Secondary | ICD-10-CM | POA: Diagnosis not present

## 2019-07-19 DIAGNOSIS — I10 Essential (primary) hypertension: Secondary | ICD-10-CM | POA: Diagnosis not present

## 2019-07-19 DIAGNOSIS — Z9889 Other specified postprocedural states: Secondary | ICD-10-CM | POA: Diagnosis not present

## 2019-07-19 DIAGNOSIS — I50810 Right heart failure, unspecified: Secondary | ICD-10-CM | POA: Diagnosis not present

## 2019-07-19 DIAGNOSIS — E538 Deficiency of other specified B group vitamins: Secondary | ICD-10-CM | POA: Diagnosis not present

## 2019-07-19 DIAGNOSIS — N183 Chronic kidney disease, stage 3 unspecified: Secondary | ICD-10-CM | POA: Diagnosis not present

## 2019-07-20 DIAGNOSIS — D510 Vitamin B12 deficiency anemia due to intrinsic factor deficiency: Secondary | ICD-10-CM | POA: Diagnosis not present

## 2019-07-20 DIAGNOSIS — N183 Chronic kidney disease, stage 3 unspecified: Secondary | ICD-10-CM | POA: Diagnosis not present

## 2019-07-20 DIAGNOSIS — Z7901 Long term (current) use of anticoagulants: Secondary | ICD-10-CM | POA: Diagnosis not present

## 2019-07-20 DIAGNOSIS — D649 Anemia, unspecified: Secondary | ICD-10-CM | POA: Diagnosis not present

## 2019-07-21 DIAGNOSIS — N390 Urinary tract infection, site not specified: Secondary | ICD-10-CM | POA: Diagnosis not present

## 2019-07-24 DIAGNOSIS — I4891 Unspecified atrial fibrillation: Secondary | ICD-10-CM | POA: Diagnosis not present

## 2019-07-24 DIAGNOSIS — J9601 Acute respiratory failure with hypoxia: Secondary | ICD-10-CM | POA: Diagnosis not present

## 2019-07-24 DIAGNOSIS — J1289 Other viral pneumonia: Secondary | ICD-10-CM | POA: Diagnosis not present

## 2019-07-24 DIAGNOSIS — E119 Type 2 diabetes mellitus without complications: Secondary | ICD-10-CM | POA: Diagnosis not present

## 2019-07-24 DIAGNOSIS — R069 Unspecified abnormalities of breathing: Secondary | ICD-10-CM | POA: Diagnosis not present

## 2019-07-24 DIAGNOSIS — E162 Hypoglycemia, unspecified: Secondary | ICD-10-CM | POA: Diagnosis not present

## 2019-07-24 DIAGNOSIS — N179 Acute kidney failure, unspecified: Secondary | ICD-10-CM | POA: Diagnosis not present

## 2019-07-24 DIAGNOSIS — R0902 Hypoxemia: Secondary | ICD-10-CM | POA: Diagnosis not present

## 2019-07-24 DIAGNOSIS — I110 Hypertensive heart disease with heart failure: Secondary | ICD-10-CM | POA: Diagnosis not present

## 2019-07-24 DIAGNOSIS — I482 Chronic atrial fibrillation, unspecified: Secondary | ICD-10-CM | POA: Diagnosis not present

## 2019-07-24 DIAGNOSIS — I509 Heart failure, unspecified: Secondary | ICD-10-CM | POA: Diagnosis not present

## 2019-07-24 DIAGNOSIS — R0602 Shortness of breath: Secondary | ICD-10-CM | POA: Diagnosis not present

## 2019-07-24 DIAGNOSIS — G9341 Metabolic encephalopathy: Secondary | ICD-10-CM | POA: Diagnosis not present

## 2019-07-24 DIAGNOSIS — I160 Hypertensive urgency: Secondary | ICD-10-CM | POA: Diagnosis not present

## 2019-07-24 DIAGNOSIS — I10 Essential (primary) hypertension: Secondary | ICD-10-CM | POA: Diagnosis not present

## 2019-07-24 DIAGNOSIS — R509 Fever, unspecified: Secondary | ICD-10-CM | POA: Diagnosis not present

## 2019-07-24 DIAGNOSIS — U071 COVID-19: Secondary | ICD-10-CM | POA: Diagnosis not present

## 2019-07-25 DIAGNOSIS — U071 COVID-19: Secondary | ICD-10-CM | POA: Diagnosis not present

## 2019-07-25 DIAGNOSIS — R0602 Shortness of breath: Secondary | ICD-10-CM | POA: Diagnosis not present

## 2019-07-25 DIAGNOSIS — I10 Essential (primary) hypertension: Secondary | ICD-10-CM | POA: Diagnosis not present

## 2019-07-25 DIAGNOSIS — I4891 Unspecified atrial fibrillation: Secondary | ICD-10-CM | POA: Diagnosis not present

## 2019-07-26 DIAGNOSIS — N179 Acute kidney failure, unspecified: Secondary | ICD-10-CM | POA: Diagnosis not present

## 2019-07-27 ENCOUNTER — Encounter (HOSPITAL_COMMUNITY): Payer: Self-pay | Admitting: Internal Medicine

## 2019-07-27 ENCOUNTER — Inpatient Hospital Stay (HOSPITAL_COMMUNITY): Payer: PPO

## 2019-07-27 ENCOUNTER — Inpatient Hospital Stay (HOSPITAL_COMMUNITY)
Admission: AD | Admit: 2019-07-27 | Discharge: 2019-07-31 | DRG: 682 | Disposition: A | Discharge status: Home Health | Payer: PPO | Source: Other Acute Inpatient Hospital | Attending: Internal Medicine | Admitting: Internal Medicine

## 2019-07-27 DIAGNOSIS — Z9981 Dependence on supplemental oxygen: Secondary | ICD-10-CM | POA: Diagnosis not present

## 2019-07-27 DIAGNOSIS — I5032 Chronic diastolic (congestive) heart failure: Secondary | ICD-10-CM | POA: Diagnosis not present

## 2019-07-27 DIAGNOSIS — M2550 Pain in unspecified joint: Secondary | ICD-10-CM | POA: Diagnosis not present

## 2019-07-27 DIAGNOSIS — I249 Acute ischemic heart disease, unspecified: Secondary | ICD-10-CM | POA: Diagnosis present

## 2019-07-27 DIAGNOSIS — J189 Pneumonia, unspecified organism: Secondary | ICD-10-CM | POA: Diagnosis not present

## 2019-07-27 DIAGNOSIS — E871 Hypo-osmolality and hyponatremia: Secondary | ICD-10-CM | POA: Diagnosis not present

## 2019-07-27 DIAGNOSIS — R7989 Other specified abnormal findings of blood chemistry: Secondary | ICD-10-CM | POA: Diagnosis present

## 2019-07-27 DIAGNOSIS — Z87891 Personal history of nicotine dependence: Secondary | ICD-10-CM

## 2019-07-27 DIAGNOSIS — G9341 Metabolic encephalopathy: Secondary | ICD-10-CM | POA: Diagnosis not present

## 2019-07-27 DIAGNOSIS — Z66 Do not resuscitate: Secondary | ICD-10-CM | POA: Diagnosis not present

## 2019-07-27 DIAGNOSIS — I4819 Other persistent atrial fibrillation: Secondary | ICD-10-CM | POA: Diagnosis present

## 2019-07-27 DIAGNOSIS — D649 Anemia, unspecified: Secondary | ICD-10-CM | POA: Diagnosis present

## 2019-07-27 DIAGNOSIS — N184 Chronic kidney disease, stage 4 (severe): Secondary | ICD-10-CM | POA: Diagnosis present

## 2019-07-27 DIAGNOSIS — J1289 Other viral pneumonia: Secondary | ICD-10-CM | POA: Diagnosis present

## 2019-07-27 DIAGNOSIS — Z7982 Long term (current) use of aspirin: Secondary | ICD-10-CM

## 2019-07-27 DIAGNOSIS — E039 Hypothyroidism, unspecified: Secondary | ICD-10-CM | POA: Diagnosis not present

## 2019-07-27 DIAGNOSIS — N179 Acute kidney failure, unspecified: Principal | ICD-10-CM | POA: Diagnosis present

## 2019-07-27 DIAGNOSIS — U071 COVID-19: Secondary | ICD-10-CM | POA: Diagnosis not present

## 2019-07-27 DIAGNOSIS — J90 Pleural effusion, not elsewhere classified: Secondary | ICD-10-CM | POA: Diagnosis not present

## 2019-07-27 DIAGNOSIS — E1122 Type 2 diabetes mellitus with diabetic chronic kidney disease: Secondary | ICD-10-CM | POA: Diagnosis not present

## 2019-07-27 DIAGNOSIS — E875 Hyperkalemia: Secondary | ICD-10-CM | POA: Diagnosis not present

## 2019-07-27 DIAGNOSIS — N390 Urinary tract infection, site not specified: Secondary | ICD-10-CM | POA: Diagnosis not present

## 2019-07-27 DIAGNOSIS — Z79899 Other long term (current) drug therapy: Secondary | ICD-10-CM

## 2019-07-27 DIAGNOSIS — Z8673 Personal history of transient ischemic attack (TIA), and cerebral infarction without residual deficits: Secondary | ICD-10-CM

## 2019-07-27 DIAGNOSIS — E11649 Type 2 diabetes mellitus with hypoglycemia without coma: Secondary | ICD-10-CM | POA: Diagnosis not present

## 2019-07-27 DIAGNOSIS — Z7401 Bed confinement status: Secondary | ICD-10-CM | POA: Diagnosis not present

## 2019-07-27 DIAGNOSIS — I129 Hypertensive chronic kidney disease with stage 1 through stage 4 chronic kidney disease, or unspecified chronic kidney disease: Secondary | ICD-10-CM | POA: Diagnosis not present

## 2019-07-27 DIAGNOSIS — Z853 Personal history of malignant neoplasm of breast: Secondary | ICD-10-CM

## 2019-07-27 DIAGNOSIS — F419 Anxiety disorder, unspecified: Secondary | ICD-10-CM | POA: Diagnosis not present

## 2019-07-27 DIAGNOSIS — R4189 Other symptoms and signs involving cognitive functions and awareness: Secondary | ICD-10-CM | POA: Diagnosis not present

## 2019-07-27 DIAGNOSIS — N270 Small kidney, unilateral: Secondary | ICD-10-CM | POA: Diagnosis present

## 2019-07-27 DIAGNOSIS — I4510 Unspecified right bundle-branch block: Secondary | ICD-10-CM | POA: Diagnosis present

## 2019-07-27 DIAGNOSIS — J9811 Atelectasis: Secondary | ICD-10-CM | POA: Diagnosis not present

## 2019-07-27 DIAGNOSIS — Z79890 Hormone replacement therapy (postmenopausal): Secondary | ICD-10-CM

## 2019-07-27 DIAGNOSIS — I10 Essential (primary) hypertension: Secondary | ICD-10-CM | POA: Diagnosis not present

## 2019-07-27 DIAGNOSIS — E785 Hyperlipidemia, unspecified: Secondary | ICD-10-CM | POA: Diagnosis present

## 2019-07-27 DIAGNOSIS — R001 Bradycardia, unspecified: Secondary | ICD-10-CM | POA: Diagnosis present

## 2019-07-27 DIAGNOSIS — Z833 Family history of diabetes mellitus: Secondary | ICD-10-CM

## 2019-07-27 DIAGNOSIS — Z9181 History of falling: Secondary | ICD-10-CM | POA: Diagnosis not present

## 2019-07-27 DIAGNOSIS — Z794 Long term (current) use of insulin: Secondary | ICD-10-CM

## 2019-07-27 DIAGNOSIS — I130 Hypertensive heart and chronic kidney disease with heart failure and stage 1 through stage 4 chronic kidney disease, or unspecified chronic kidney disease: Secondary | ICD-10-CM | POA: Diagnosis present

## 2019-07-27 DIAGNOSIS — Z7902 Long term (current) use of antithrombotics/antiplatelets: Secondary | ICD-10-CM

## 2019-07-27 DIAGNOSIS — R5381 Other malaise: Secondary | ICD-10-CM | POA: Diagnosis not present

## 2019-07-27 DIAGNOSIS — D631 Anemia in chronic kidney disease: Secondary | ICD-10-CM | POA: Diagnosis not present

## 2019-07-27 DIAGNOSIS — M1990 Unspecified osteoarthritis, unspecified site: Secondary | ICD-10-CM | POA: Diagnosis not present

## 2019-07-27 DIAGNOSIS — N3001 Acute cystitis with hematuria: Secondary | ICD-10-CM | POA: Diagnosis not present

## 2019-07-27 DIAGNOSIS — I4891 Unspecified atrial fibrillation: Secondary | ICD-10-CM | POA: Diagnosis not present

## 2019-07-27 DIAGNOSIS — J9601 Acute respiratory failure with hypoxia: Secondary | ICD-10-CM | POA: Diagnosis not present

## 2019-07-27 DIAGNOSIS — E1165 Type 2 diabetes mellitus with hyperglycemia: Secondary | ICD-10-CM | POA: Diagnosis present

## 2019-07-27 DIAGNOSIS — Z978 Presence of other specified devices: Secondary | ICD-10-CM | POA: Diagnosis not present

## 2019-07-27 DIAGNOSIS — I480 Paroxysmal atrial fibrillation: Secondary | ICD-10-CM

## 2019-07-27 HISTORY — DX: Chronic diastolic (congestive) heart failure: I50.32

## 2019-07-27 HISTORY — DX: Bradycardia, unspecified: R00.1

## 2019-07-27 HISTORY — DX: Paroxysmal atrial fibrillation: I48.0

## 2019-07-27 HISTORY — DX: Malignant neoplasm of unspecified site of unspecified female breast: C50.919

## 2019-07-27 LAB — HEMOGLOBIN A1C
Hgb A1c MFr Bld: 7.4 % — ABNORMAL HIGH (ref 4.8–5.6)
Mean Plasma Glucose: 165.68 mg/dL

## 2019-07-27 LAB — GLUCOSE, CAPILLARY
Glucose-Capillary: 215 mg/dL — ABNORMAL HIGH (ref 70–99)
Glucose-Capillary: 210 mg/dL — ABNORMAL HIGH (ref 70–99)
Glucose-Capillary: 144 mg/dL — ABNORMAL HIGH (ref 70–99)

## 2019-07-27 LAB — ABO/RH: ABO/RH(D): B POS

## 2019-07-27 LAB — D-DIMER, QUANTITATIVE: D-Dimer, Quant: 2.49 ug/mL-FEU — ABNORMAL HIGH (ref 0.00–0.50)

## 2019-07-27 MED ORDER — AMIODARONE HCL IN DEXTROSE 360-4.14 MG/200ML-% IV SOLN
30.0000 mg/h | INTRAVENOUS | Status: DC
  Filled 2019-07-27: qty 200

## 2019-07-27 MED ORDER — DEXAMETHASONE 6 MG PO TABS
6.0000 mg | ORAL_TABLET | ORAL | Status: DC
  Administered 2019-07-27 – 2019-07-31 (×5): 6 mg via ORAL
  Filled 2019-07-27 (×5): qty 1

## 2019-07-27 MED ORDER — INSULIN DETEMIR 100 UNIT/ML ~~LOC~~ SOLN
15.0000 [IU] | Freq: Every day | SUBCUTANEOUS | Status: DC
  Administered 2019-07-27 – 2019-07-30 (×4): 15 [IU] via SUBCUTANEOUS
  Filled 2019-07-27 (×5): qty 0.15

## 2019-07-27 MED ORDER — MAGNESIUM 400 MG PO TABS: 400.0000 mg | ORAL_TABLET | Freq: Two times a day (BID) | ORAL | Status: DC

## 2019-07-27 MED ORDER — SODIUM CHLORIDE 0.9 % IV SOLN: 100.0000 mg | Freq: Every day | INTRAVENOUS | Status: DC

## 2019-07-27 MED ORDER — AMIODARONE HCL 200 MG PO TABS: 400.0000 mg | ORAL_TABLET | Freq: Two times a day (BID) | ORAL | Status: DC

## 2019-07-27 MED ORDER — SODIUM CHLORIDE 0.9 % IV SOLN: 200.0000 mg | Freq: Once | INTRAVENOUS | Status: DC

## 2019-07-27 MED ORDER — SENNA 8.6 MG PO TABS
2.0000 | ORAL_TABLET | Freq: Every day | ORAL | Status: DC | PRN
  Administered 2019-07-28: 10:00:00 17.2 mg via ORAL
  Filled 2019-07-27: qty 2

## 2019-07-27 MED ORDER — AMIODARONE HCL IN DEXTROSE 360-4.14 MG/200ML-% IV SOLN
60.0000 mg/h | INTRAVENOUS | Status: DC
  Administered 2019-07-27: 60 mg/h via INTRAVENOUS
  Filled 2019-07-27: qty 200

## 2019-07-27 MED ORDER — SODIUM CHLORIDE 0.9 % IV SOLN
100.0000 mg | Freq: Every day | INTRAVENOUS | Status: AC
  Administered 2019-07-28 – 2019-07-29 (×2): 100 mg via INTRAVENOUS
  Filled 2019-07-27 (×2): qty 20

## 2019-07-27 MED ORDER — INSULIN ASPART 100 UNIT/ML ~~LOC~~ SOLN: 0.0000 [IU] | Freq: Three times a day (TID) | SUBCUTANEOUS | Status: DC

## 2019-07-27 MED ORDER — MECLIZINE HCL 25 MG PO TABS: 25.0000 mg | ORAL_TABLET | Freq: Three times a day (TID) | ORAL | Status: DC | PRN

## 2019-07-27 MED ORDER — ASPIRIN EC 81 MG PO TBEC
81.0000 mg | DELAYED_RELEASE_TABLET | Freq: Every day | ORAL | Status: DC
  Administered 2019-07-28 – 2019-07-31 (×4): 81 mg via ORAL
  Filled 2019-07-27 (×5): qty 1

## 2019-07-27 MED ORDER — APIXABAN 2.5 MG PO TABS: 2.5000 mg | ORAL_TABLET | Freq: Two times a day (BID) | ORAL | Status: DC

## 2019-07-27 MED ORDER — DILTIAZEM HCL-DEXTROSE 125-5 MG/125ML-% IV SOLN (PREMIX)
5.0000 mg/h | INTRAVENOUS | Status: DC
  Administered 2019-07-27: 14:00:00 5 mg/h via INTRAVENOUS
  Filled 2019-07-27: qty 125

## 2019-07-27 MED ORDER — ACETAMINOPHEN 325 MG PO TABS: 650.0000 mg | ORAL_TABLET | Freq: Four times a day (QID) | ORAL | Status: DC | PRN

## 2019-07-27 MED ORDER — METOPROLOL TARTRATE 50 MG PO TABS: 50.0000 mg | ORAL_TABLET | Freq: Two times a day (BID) | ORAL | Status: DC

## 2019-07-27 MED ORDER — INSULIN ASPART 100 UNIT/ML ~~LOC~~ SOLN
0.0000 [IU] | Freq: Three times a day (TID) | SUBCUTANEOUS | Status: DC
  Administered 2019-07-27: 18:00:00 5 [IU] via SUBCUTANEOUS
  Administered 2019-07-28 – 2019-07-29 (×3): 2 [IU] via SUBCUTANEOUS

## 2019-07-27 MED ORDER — LEVOTHYROXINE SODIUM 75 MCG PO TABS
75.0000 ug | ORAL_TABLET | Freq: Every day | ORAL | Status: DC
  Administered 2019-07-28 – 2019-07-31 (×4): 75 ug via ORAL
  Filled 2019-07-27 (×5): qty 1

## 2019-07-27 MED ORDER — METOPROLOL TARTRATE 25 MG PO TABS
25.0000 mg | ORAL_TABLET | Freq: Two times a day (BID) | ORAL | Status: DC
  Administered 2019-07-27 – 2019-07-31 (×8): 25 mg via ORAL
  Filled 2019-07-27 (×8): qty 1

## 2019-07-27 MED ORDER — AMIODARONE HCL 200 MG PO TABS
200.0000 mg | ORAL_TABLET | Freq: Two times a day (BID) | ORAL | Status: DC
  Administered 2019-07-27 – 2019-07-31 (×8): 200 mg via ORAL
  Filled 2019-07-27 (×8): qty 1

## 2019-07-27 MED ORDER — INSULIN ASPART 100 UNIT/ML ~~LOC~~ SOLN
0.0000 [IU] | Freq: Every day | SUBCUTANEOUS | Status: DC
  Administered 2019-07-29: 2 [IU] via SUBCUTANEOUS
  Administered 2019-07-30: 22:00:00 3 [IU] via SUBCUTANEOUS

## 2019-07-27 MED ORDER — CLOPIDOGREL BISULFATE 75 MG PO TABS
75.0000 mg | ORAL_TABLET | Freq: Every day | ORAL | Status: DC
  Administered 2019-07-28 – 2019-07-31 (×4): 75 mg via ORAL
  Filled 2019-07-27 (×5): qty 1

## 2019-07-27 MED ORDER — ATORVASTATIN CALCIUM 80 MG PO TABS
80.0000 mg | ORAL_TABLET | Freq: Every day | ORAL | Status: DC
  Administered 2019-07-27 – 2019-07-30 (×4): 80 mg via ORAL
  Filled 2019-07-27 (×4): qty 1

## 2019-07-27 MED ORDER — CALCIUM POLYCARBOPHIL 625 MG PO TABS
625.0000 mg | ORAL_TABLET | Freq: Two times a day (BID) | ORAL | Status: DC | PRN
  Filled 2019-07-27: qty 1

## 2019-07-27 MED ORDER — VITAMIN B-12 1000 MCG PO TABS
1000.0000 ug | ORAL_TABLET | Freq: Every day | ORAL | Status: DC
  Administered 2019-07-28 – 2019-07-31 (×4): 1000 ug via ORAL
  Filled 2019-07-27 (×5): qty 1

## 2019-07-27 NOTE — Progress Notes (Signed)
-   Patient converted to NSR. - Vitals checked, HR on the low 50's, IV Amiodarone & Cardizem stopped; Dr. Roosevelt Locks informed. - EKG taken. - No further interventions at this time

## 2019-07-27 NOTE — Progress Notes (Signed)
Nurse report pt HR in 50s and sinus, will stop both Cardizem and Amiodarone drip for now, repeat EKG.

## 2019-07-27 NOTE — Progress Notes (Signed)
Pharmacy - Remdesivir / Apixaban  Patient transferred from [facility]  Admitted 12 / 20  Has received first 3 doses of Remdesivir  On Eliquis for Afib - 2.5 mg po BID  Last Scr  = 3 Last ALT = 12  Plan: Remdesivir 100 mg iv q 24 hours x 2 additional doses Eliquis 2.5 mg po BID  Thank you Anette Guarneri, PharmD [phone]

## 2019-07-27 NOTE — H&P (Signed)
History and Physical    Vee Bahe HQI:[id number] DOB: 05/31/1932 DOA: 07/27/2019  PCP: Ocie Doyne., MD   Patient coming from: [facility]  I have personally briefly reviewed patient's old medical records in [place]  Chief Complaint: Palpitations.  HPI: Amber Ballard is a 85 y.o. female with medical history significant of IDDM, HTN hyperlipidemia, diastolic CHF, DJD status post right hip surgery 3 weeks ago presented to [facility] ED on December 20 with increasing short of breath, was found to have hypoxia with saturation 86% on room air.  COVID-19 screening turn positive, T angiogram showed moderate bilateral pleural effusions, and patchy infiltrates, but no PE.  Remdesivir was started along with Decadron, patient however developed rapid A. Fib, Cardizem drip was started with little response, then Neurotone drip was added by cardiologist in [facility], and Eliquis was also started.  When I saw the patient, she is on 2 L, she reports no short of breath, but some palpitations, her heart rate now well controlled with breakthrough to 130s to 140s, denies any chest pain, no short of breath.  She does have some productive cough with whitish sputum, no fever chills no abdominal pain no diarrhea, loss of taste.  ED Course: Her oxygenation has been stable, her heart rate now well controlled.  Review of Systems: As per HPI otherwise 10 point review of systems negative.    Past Medical History:  Diagnosis Date  . Anxiety   . Arthritis   . Diabetes mellitus   . Heart murmur   . Hyperlipidemia   . Hypertension   . Hypothyroidism   . PONV (postoperative nausea and vomiting)   . Stroke ([place])   . Wears glasses     Past Surgical History:  Procedure Laterality Date  . ABDOMINAL HYSTERECTOMY    . BREAST SURGERY     lt lumpectomy-snbx  . COLONOSCOPY    . EYE SURGERY     both cataracts  . FRACTURE SURGERY     rt arm,rt fingers  . ORIF ANKLE FRACTURE   03/26/2012   Procedure: OPEN REDUCTION INTERNAL FIXATION (ORIF) ANKLE FRACTURE;  Surgeon: Wylene Simmer, MD;  Location: [place];  Service: Orthopedics;  Laterality: Right;  Open Reduction Internal Fixation Right Ankle Trimalleolar Fracture      reports that she quit smoking about 9 years ago. She has never used smokeless tobacco. She reports that she does not drink alcohol or use drugs.  No Known Allergies  Family History  Problem Relation Age of Onset  . Diabetes Mother      Prior to Admission medications   Medication Sig Start Date End Date Taking? Authorizing Provider  amLODipine (NORVASC) 10 MG tablet Take 10 mg by mouth daily.  04/14/18   [provider]  aspirin 81 MG tablet Take 1 tablet (81 mg total) by mouth daily. 09/14/18   Garvin Fila, MD  atorvastatin (LIPITOR) 80 MG tablet Take 80 mg by mouth daily at 6 PM.  08/17/18   [provider]  Calcium Polycarbophil (FIBER LAXATIVE PO) Take 1 tablet by mouth daily as needed.     [provider]  clopidogrel (PLAVIX) 75 MG tablet Take 75 mg by mouth daily.    [provider]  furosemide (LASIX) 40 MG tablet Take 1 tablet (40 mg total) by mouth daily as needed for fluid or edema (as needed for weight gain 3 lbs in 24 hours or 5 lbs in 7 days.). 01/22/19   Arrien, Mauricio  Quillian Quince, MD  hydrALAZINE (APRESOLINE) 100 MG tablet Take 100 mg by mouth every 8 (eight) hours.    [provider]  insulin aspart (NOVOLOG) 100 UNIT/ML injection Inject 0-14 Units into the skin 3 (three) times daily with meals. Sliding scale per Dr. Micheal Likens    [provider]  insulin detemir (LEVEMIR) 100 UNIT/ML injection Inject 15 Units into the skin at bedtime. Patient taking differently: Inject 16 Units into the skin 2 (two) times daily.  03/30/12   Wylene Simmer, MD  levothyroxine (SYNTHROID, LEVOTHROID) 75 MCG tablet Take 75 mcg by mouth daily before breakfast.  08/17/18   [provider]  losartan (COZAAR) 100  MG tablet Take 100 mg by mouth daily.  08/06/18   [provider]  Magnesium 400 MG TABS Take 400 mg by mouth 2 (two) times daily.    [provider]  meclizine (ANTIVERT) 25 MG tablet Take 25 mg by mouth 3 (three) times daily as needed for dizziness.    [provider]  metoprolol tartrate (LOPRESSOR) 50 MG tablet Take 1 tablet (50 mg total) by mouth 2 (two) times daily for 30 days. 01/22/19 02/21/19  Arrien, Jimmy Picket, MD  potassium chloride (K-DUR) 10 MEQ tablet Take 1 tablet (10 mEq total) by mouth daily as needed (Take only with furosemide.). 01/22/19   Arrien, Jimmy Picket, MD  senna (SENOKOT) 8.6 MG TABS Take 2 tablets (17.2 mg total) by mouth 2 (two) times daily. Patient taking differently: Take 2 tablets by mouth daily as needed.  03/29/12   Wylene Simmer, MD  vitamin B-12 (CYANOCOBALAMIN) 1000 MCG tablet Take 1,000 mcg by mouth daily.    [provider]  Vitamin D, Ergocalciferol, (DRISDOL) 1.25 MG (50000 UT) CAPS capsule Take 50,000 Units by mouth every 7 (seven) days.    [provider]    Physical Exam: Vitals:   07/27/19 1206 07/27/19 1300  BP: 133/75   Pulse: 97   Resp: 20   SpO2: 95%   Weight:  63.5 kg  Height:  5\' 2"  (1.575 m)    Constitutional: NAD, calm, comfortable Vitals:   07/27/19 1206 07/27/19 1300  BP: 133/75   Pulse: 97   Resp: 20   SpO2: 95%   Weight:  63.5 kg  Height:  5\' 2"  (1.575 m)   Eyes: PERRL, lids and conjunctivae normal ENMT: Mucous membranes are moist. Posterior pharynx clear of any exudate or lesions.Normal dentition.  Neck: normal, supple, no masses, no thyromegaly Respiratory: clear to auscultation bilaterally, no wheezing, no crackles. Normal respiratory effort. No accessory muscle use.  Cardiovascular: Irregular and tachycardia, no murmurs / rubs / gallops. No extremity edema. 2+ pedal pulses. No carotid bruits.  Abdomen: no tenderness, no masses palpated. No hepatosplenomegaly. Bowel sounds  positive.  Musculoskeletal: no clubbing / cyanosis. No joint deformity upper and lower extremities. Good ROM, no contractures. Normal muscle tone.  Skin: no rashes, lesions, ulcers. No induration Neurologic: CN 2-12 grossly intact. Sensation intact, DTR normal. Strength 5/5 in all 4.  Psychiatric: Normal judgment and insight.  Oriented to herself confused about time and place. Normal mood.     Labs on Admission: I have personally reviewed following labs and imaging studies  CBC: No results for input(s): WBC, NEUTROABS, HGB, HCT, MCV, PLT in the last 168 hours. Basic Metabolic Panel: No results for input(s): NA, K, CL, CO2, GLUCOSE, BUN, CREATININE, CALCIUM, MG, PHOS in the last 168 hours. GFR: CrCl cannot be calculated (Patient's most recent lab result is  older than the maximum 21 days allowed.). Liver Function Tests: No results for input(s): AST, ALT, ALKPHOS, BILITOT, PROT, ALBUMIN in the last 168 hours. No results for input(s): LIPASE, AMYLASE in the last 168 hours. No results for input(s): AMMONIA in the last 168 hours. Coagulation Profile: No results for input(s): INR, PROTIME in the last 168 hours. Cardiac Enzymes: No results for input(s): CKTOTAL, CKMB, CKMBINDEX, TROPONINI in the last 168 hours. BNP (last 3 results) No results for input(s): PROBNP in the last 8760 hours. HbA1C: No results for input(s): HGBA1C in the last 72 hours. CBG: Recent Labs  Lab 07/27/19 1301  GLUCAP 215*   Lipid Profile: No results for input(s): CHOL, HDL, LDLCALC, TRIG, CHOLHDL, LDLDIRECT in the last 72 hours. Thyroid Function Tests: No results for input(s): TSH, T4TOTAL, FREET4, T3FREE, THYROIDAB in the last 72 hours. Anemia Panel: No results for input(s): VITAMINB12, FOLATE, FERRITIN, TIBC, IRON, RETICCTPCT in the last 72 hours. Urine analysis: No results found for: COLORURINE, APPEARANCEUR, LABSPEC, [place], GLUCOSEU, [place], BILIRUBINUR, KETONESUR, PROTEINUR, UROBILINOGEN, NITRITE,  LEUKOCYTESUR  Radiological Exams on Admission: DG Chest 1 View  Result Date: 07/27/2019 CLINICAL DATA:  Pneumonia EXAM: CHEST  1 VIEW COMPARISON:  07/24/2019 chest radiograph. FINDINGS: Stable cardiomediastinal silhouette with mild cardiomegaly. No pneumothorax. Stable small bilateral pleural effusions, left greater than right. Patchy apical right and parahilar left mid lung opacities, mildly increased. IMPRESSION: 1. Mildly increased patchy apical right in parahilar left mid lung opacities, compatible with reported pneumonia versus mild pulmonary edema. 2. Small stable bilateral pleural effusions, left greater than right. 3. Mild cardiomegaly. Electronically Signed   By: Ilona Sorrel M.D.   On: 07/27/2019 14:31    EKG: Independently reviewed.  A. fib  Assessment/Plan Active Problems:   COVID-19  A. fib with RVR, likely related to hypoxia COVID-19 infection, cussed with [facility] cardiology PA, who will help [place] to manage patient's rate control medications.  Discussed about patient looks like has been on 2 antiplatelet plus Eliquis, probably can reduced to 1 antiplatelet and Eliquis regimen.  Echocardiogram ordered.  Acute hypoxic respiratory failure secondary to COVID-19 infection, continue remdesivir and Decadron.  Uncontrolled IDDM, likely from steroid use, increase her Lantus, continue sliding scale.  Hypertension, fairly controlled, hold some home BP meds to give room for drips.  Hypothyroidism, on Synthroid check TSH.     DVT prophylaxis: Eliquis  code Status: DNR Family Communication: None at bedside Disposition Plan: Likely can go home once heart rate controlled Consults called: Gerald Stabs cardiology PA Admission status: PCU   Lequita Halt MD [facility] Pager [phone] If 7PM-7AM, please contact night-coverage [email or web address] Password [email or web address]  07/27/2019, 2:50 PM

## 2019-07-27 NOTE — Progress Notes (Signed)
Patient transferred from [facility] via [facility] to [facility] via stretcher; alert and oriented x 3, on O2 2L via nasal cannula; no complaints of pain; IV G20 on LAC and G20 on RFA with ongoing IV Amiodarone & Cardizem; with bruises on bilateral hands noted.  Orient patient to room and unit; instructed how to use the call bell and  fall risk precautions.

## 2019-07-27 NOTE — Consult Note (Addendum)
Cardiology Consultation:   Due to the COVID-19 pandemic, this visit was completed with telemedicine (audio/video) technology to reduce patient and provider exposure as well as to preserve personal protective equipment.   Patient ID: Amber Ballard MRN: [id number]; DOB: 1931-10-30  Admit date: 07/27/2019 Date of Consult: 07/27/2019  Primary Care Provider: Ocie Ballard., MD Primary Cardiologist: Amber Campus, MD  Primary Electrophysiologist:  None   Patient Profile:   Amber Ballard is a 85 y.o. female with a hx of breast CA, diabetes mellitus type 2, HTN, anxiety, CVA 08/2017, hypothyroidism, CKD stage III (prior baseline reported 1.1-1.3), paroxysmal atrial fibrillation, bradycardia, chronic diastolic CHF and falls who is being seen today for the evaluation of atrial fibrillation at the request of Dr. Roosevelt Ballard. She was admitted with hypoxia and Covid-19 infection.  History of Present Illness:   Information is obtained from Amber Ballard, her Amber Ballard shadow chart and speaking with Amber Ballard. Per Amber Ballard, the patient had a stroke in 08/2017 and was started on ASA + Plavix at [facility]. When she was admitted in 01/2019 with fall and DKA, she was reported to be in atrial fib with PVCs on admission. Per H/P, "Patient and family unaware of arrhythmia hx but she was previously on Xarelto for unknown indication." She was continued on ASA + Plavix at that time. There was no specific mention about transitioning to anticoagulation. Her metoprolol was reduced due to bradycardia. It is not clear if she was in NSR or atrial fib at time of discharge. She has not seen a cardiologist before this admission.   She sustained a mechanical fall and right hip fracture in on Thanskgiving requiring ORIF. She's had 3 total falls in the last 6 months. She walks with a walker at home. At baseline Amber Ballard reports she does have some lapses in memory (forgetting peoples names) and sometimes becomes confused in the middle of the night,  but does fairly well most of the time. The patient lives with Amber Ballard and her Amber, and he had recently tested positive. The patient had had diarrhea for a few days prior to admission. However, she then began to develop worsening SOB, fever and confusion. Her blood pressure was markedly elevated as well. She was positive for SARS-CoV2. She was found to be hypoxic requiring nasal cannula supplemental oxygen. She also had hypertensive urgency requiring IV hydralazine and labetalol. Admission EKG showed NSR with RBBB.   She was seen by Amber Ballard on 07/25/19 while admitted whose note indicates that while having her CT angio, she went into rapid atrial fibrillation. She was treated with beta blockers and diltiazem. However, HR remained greater than 130s. Her blood pressure also began to trend softer so Amber Ballard recommended initiation of amiodarone and full dose anticoagulation. At present time upon arrival she is on Eliquis, aspirin and Plavix. She converted to SB/NSR HR 50s-60s so IV diltiazem and IV amiodarone were stopped.  Hospital course is also notable for severe hyperglycemia with subsequent hypoglycemia, anemia, thrombocytosis, AKI on CKD, elevated d-dimer, and metabolic encephalopathy. She was given 1 dose of Lasix in the ER based on CT scan but then felt to have pre-renal azotemia with AKI (Cr currently 3.0) so further diuretics were held and IV fluids were started. felt to require transfer to tertiary center for Covid-19 care with remdesevir and dexamethasone.   Additional Data from Outside Hospital CXR 07/25/19: increased L pleural effusion and retrocardiac opacity from prior exam. Small right pleural effusion and adjacent atelectasis likely unchanged. Cardiomegaly with vascular congestion and  mild pulmonary edema suspicious for fluid overload/CHF.  CT Angio 07/25/19: No PE. Moderate bilateral pleural effusions increased from prior CT, adjacent compressive atelectasis of near complete  atelectasis of the left lower lobe. Pulmonary edema with septal thickening and ground glass opacities.   Initial labs showed Cr of 1.1, Troponin 0.02, 0.03, Hgb 10.0, INR 1.0, hyponatremia down to 128, hypocalcemia of 7.2, Mg 2.3 F/u labs 07/27/19 with WBC 9.6, Hgb 8.4, plt 366, Na 130, K 4.8, BUN 50, Cr 3.00, calcium 7.2, normal LTs, albumin 2.9   Past Medical History:  Diagnosis Date  . Anxiety   . Arthritis   . Bradycardia   . Breast cancer ([place])   . Chronic diastolic (congestive) heart failure ([place])   . Diabetes mellitus   . Heart murmur   . Hyperlipidemia   . Hypertension   . Hypothyroidism   . PAF (paroxysmal atrial fibrillation) ([place])   . PONV (postoperative nausea and vomiting)   . Stroke ([place])   . Wears glasses     Past Surgical History:  Procedure Laterality Date  . ABDOMINAL HYSTERECTOMY    . BREAST SURGERY     lt lumpectomy-snbx  . COLONOSCOPY    . EYE SURGERY     both cataracts  . FRACTURE SURGERY     rt arm,rt fingers  . ORIF ANKLE FRACTURE  03/26/2012   Procedure: OPEN REDUCTION INTERNAL FIXATION (ORIF) ANKLE FRACTURE;  Surgeon: Wylene Simmer, MD;  Location: [facility];  Service: Orthopedics;  Laterality: Right;  Open Reduction Internal Fixation Right Ankle Trimalleolar Fracture      Home Medications:  Prior to Admission medications   Medication Sig Start Date End Date Taking? Authorizing Provider  amLODipine (NORVASC) 10 MG tablet Take 10 mg by mouth daily.  04/14/18   [provider]  aspirin 81 MG tablet Take 1 tablet (81 mg total) by mouth daily. 09/14/18   Garvin Fila, MD  atorvastatin (LIPITOR) 80 MG tablet Take 80 mg by mouth daily at 6 PM.  08/17/18   [provider]  Calcium Polycarbophil (FIBER LAXATIVE PO) Take 1 tablet by mouth daily as needed.     [provider]  clopidogrel (PLAVIX) 75 MG tablet Take 75 mg by mouth daily.    [provider]  furosemide (LASIX) 40 MG tablet Take 1 tablet (40 mg total) by mouth  daily as needed for fluid or edema (as needed for weight gain 3 lbs in 24 hours or 5 lbs in 7 days.). 01/22/19   Arrien, Jimmy Picket, MD  hydrALAZINE (APRESOLINE) 100 MG tablet Take 100 mg by mouth every 8 (eight) hours.    [provider]  insulin aspart (NOVOLOG) 100 UNIT/ML injection Inject 0-14 Units into the skin 3 (three) times daily with meals. Sliding scale per Dr. Micheal Likens    [provider]  insulin detemir (LEVEMIR) 100 UNIT/ML injection Inject 15 Units into the skin at bedtime. Patient taking differently: Inject 16 Units into the skin 2 (two) times daily.  03/30/12   Wylene Simmer, MD  levothyroxine (SYNTHROID, LEVOTHROID) 75 MCG tablet Take 75 mcg by mouth daily before breakfast.  08/17/18   [provider]  losartan (COZAAR) 100 MG tablet Take 100 mg by mouth daily.  08/06/18   [provider]  Magnesium 400 MG TABS Take 400 mg by mouth 2 (two) times daily.    [provider]  meclizine (ANTIVERT) 25 MG tablet Take 25 mg by mouth 3 (three) times daily as needed  for dizziness.    [provider]  metoprolol tartrate (LOPRESSOR) 50 MG tablet Take 1 tablet (50 mg total) by mouth 2 (two) times daily for 30 days. 01/22/19 02/21/19  Arrien, Jimmy Picket, MD  potassium chloride (K-DUR) 10 MEQ tablet Take 1 tablet (10 mEq total) by mouth daily as needed (Take only with furosemide.). 01/22/19   Arrien, Jimmy Picket, MD  senna (SENOKOT) 8.6 MG TABS Take 2 tablets (17.2 mg total) by mouth 2 (two) times daily. Patient taking differently: Take 2 tablets by mouth daily as needed.  03/29/12   Wylene Simmer, MD  vitamin B-12 (CYANOCOBALAMIN) 1000 MCG tablet Take 1,000 mcg by mouth daily.    [provider]  Vitamin D, Ergocalciferol, (DRISDOL) 1.25 MG (50000 UT) CAPS capsule Take 50,000 Units by mouth every 7 (seven) days.    [provider]    Inpatient Medications: Scheduled Meds: . apixaban  2.5 mg Oral BID  . [START ON  07/28/2019] aspirin EC  81 mg Oral Daily  . atorvastatin  80 mg Oral q1800  . [START ON 07/28/2019] clopidogrel  75 mg Oral Daily  . dexamethasone  6 mg Oral Q24H  . insulin aspart  0-14 Units Subcutaneous TID WC  . insulin detemir  15 Units Subcutaneous QHS  . [START ON 07/28/2019] levothyroxine  75 mcg Oral Q0600  . metoprolol tartrate  50 mg Oral BID  . [START ON 07/28/2019] vitamin B-12  1,000 mcg Oral Daily   Continuous Infusions: . [START ON 07/28/2019] remdesivir 100 mg in NS 100 mL     PRN Meds: acetaminophen, meclizine, polycarbophil, senna  Allergies:   No Known Allergies  Social History:   Social History   Socioeconomic History  . Marital status: Widowed    Spouse name: Jeneen Ballard  . Number of children: 1  . Years of education: 12  . Highest education level: 12th grade  Occupational History  . Occupation: Retired Acupuncturist  Tobacco Use  . Smoking status: Former Smoker    Quit date: 03/24/2010    Years since quitting: 9.3  . Smokeless tobacco: Never Used  Substance and Sexual Activity  . Alcohol use: No  . Drug use: No  . Sexual activity: Not on file  Other Topics Concern  . Not on file  Social History Narrative  . Not on file   Social Determinants of Health   Financial Resource Strain:   . Difficulty of Paying Living Expenses: Not on file  Food Insecurity:   . Worried About Charity fundraiser in the Last Year: Not on file  . Ran Out of Food in the Last Year: Not on file  Transportation Needs:   . Lack of Transportation (Medical): Not on file  . Lack of Transportation (Non-Medical): Not on file  Physical Activity:   . Days of Exercise per Week: Not on file  . Minutes of Exercise per Session: Not on file  Stress:   . Feeling of Stress : Not on file  Social Connections:   . Frequency of Communication with Friends and Family: Not on file  . Frequency of Social Gatherings with Friends and Family: Not on file  . Attends Religious Services: Not on file    . Active Member of Clubs or Organizations: Not on file  . Attends Archivist Meetings: Not on file  . Marital Status: Not on file  Intimate Partner Violence:   . Fear of Current or Ex-Partner: Not on file  . Emotionally Abused:  Not on file  . Physically Abused: Not on file  . Sexually Abused: Not on file    Family History:   Family History  Problem Relation Age of Onset  . Diabetes Mother      ROS:  Unable to review due to confusion  Physical Exam/Data:   Vitals:   07/27/19 1345 07/27/19 1400 07/27/19 1415 07/27/19 1600  BP: 100/63 (!) 142/72 118/60 (!) 154/54  Ballard: 99 99 (!) 102   Resp: 20 18 20    Temp:    97.9 F (36.6 C)  TempSrc:    Oral  SpO2:   93%   Weight:      Height:        Intake/Output Summary (Last 24 hours) at 07/27/2019 1713 Last data filed at 07/27/2019 1306 Gross per 24 hour  Intake --  Output 425 ml  Net -425 ml   Last 3 Weights 07/27/2019 01/20/2019 10/14/2018  Weight (lbs) 139 lb 15.9 oz 133 lb 6.1 oz 138 lb  Weight (kg) 63.5 kg 60.5 kg 62.596 kg     Body mass index is 25.6 kg/m.   VITAL SIGNS:  reviewed  Exam deferred as there is no video capability in this room. Patient is confused, so telephone call would not be helpful. All clinical data available and reviewed.  EKG:  The EKG was personally reviewed and demonstrates 1) Initial tracing 12/20 with NSR/sinus arrhythmia with RBBB, possible prior lateral infarct - P waves most clear in V3 2) Subsequent tracing 12/20 with rapid atrial fib with RBBB 3) SB 54BPM with RBBB, QTc 412ms, no acute STT changes  Telemetry:  Telemetry was personally reviewed and demonstrates: atrial fibrillation with subsequent conversion to NSR shortly before 3pm today  Laboratory Data: As above.   Radiology/Studies:  DG Chest 1 View  Result Date: 07/27/2019 CLINICAL DATA:  Pneumonia EXAM: CHEST  1 VIEW COMPARISON:  07/24/2019 chest radiograph. FINDINGS: Stable cardiomediastinal silhouette with  mild cardiomegaly. No pneumothorax. Stable small bilateral pleural effusions, left greater than right. Patchy apical right and parahilar left mid lung opacities, mildly increased. IMPRESSION: 1. Mildly increased patchy apical right in parahilar left mid lung opacities, compatible with reported pneumonia versus mild pulmonary edema. 2. Small stable bilateral pleural effusions, left greater than right. 3. Mild cardiomegaly. Electronically Signed   By: Ilona Sorrel M.D.   On: 07/27/2019 14:31    Assessment and Plan:   1. Acute hypoxic respiratory failure secondary to Covid-19 virus infection - further per internal medicine team.  2. Persistent atrial fibrillation with rapid ventricular response - she likely has some degree of underlying tachy-bradycardia syndrome based on historical information. It may be that when she is in atrial fib she tends to run fast and when in sinus her heart rate is in the 50s-60s. Fortunately she has converted to SB/NSR in the 50s-60s this afternoon. At this point she has proven her atrial fib is paroxysmal. Given her acute illness she is at risk for return to atrial fib. We will need to be cautious with her heart rate. Per d/w MD, plan initiation of oral amiodarone with reduction in metoprolol to 25mg  BID. Keep off diltiazem for now. CHADSVASC is 8 for h/o CHF, HTN, age, diabetes, stroke, and female sex. Unfortunately given the clinical information available with frailty and recent recurrent falling, Dr. Gwenlyn Found feels she would be a poor candidate for long term anticoagulation and suggests ASA / Plavix only at this time. Her bleeding risk is also increased with worsening Ballard function  and downtrending anemia. These will need to be followed by primary service.  3. AKI on CKD stage III - last Cr was 3.0 today. Suspect related to viral infection, blood pressure shifts, contrast load, diuretic and ARB. Losartan now on hold. May need nephrology consultation. Hold on further  diuretic.  4. H/o chronic diastolic CHF - will obtain baseline echocardiogram. Per IM note, no edema on exam. Small bilateral effusions on CXR. Diuretics on hold given renal insufficiency. Will add strict I/o's and daily weights. She does appear to have been treated with IVF at OSH.  5. Anemia - difficult to know baseline as she is not typically followed in our system. She did have anemia when admitted in 01/2019. Hgb has downtrended from 10->8.6. Stop Eliquis as above. Ordered hemoccult.  6. Hypothyroidism -  TSH ordered for AM.  For questions or updates, please contact [place] Please consult [email or web address] for contact info under   Signed, Charlie Pitter, PA-C  07/27/2019 5:13 PM    Agree with findings by Melina Copa PA-C  Amber Ballard is being seen in consult after transfer from [facility] where she was seen by Amber Ballard.  We are asked to evaluate her PAF with RVR.  She was recently diagnosed with COVID-19.  She had hypoxic respiratory failure.  Other problems include anemia previously on Eliquis, chronic diastolic heart failure and stage III CKD.  Apparently she has had several falls and has some cognitive impairment.  She did have a mildly positive D-dimer and a CTA that showed no pulmonary emboli but did show groundglass changes in her pulmonary parenchyma consistent with Covid.  As result of her radiocontrast administration and diuretic her serum creatinine increased to the 3 level.  Her initial EKG was sinus bradycardia and then she went into A. fib with RVR and converted back to sinus rhythm/sinus bradycardia.  She is on beta-blocker.  Based on this she has tachybradycardia syndrome.  She is had a stroke in the past and was on low-dose aspirin and Plavix.  Given her age, tendency to fall and cognitive impairment I do not think she is a good candidate for a NOAC.  I suspect she will have recurrent PAF.  I recommend decreasing her metoprolol from 50 to 25 mg p.o. twice daily, load  her with amiodarone at 200 mg p.o. twice daily and decrease to 200 mg a day after 7 days.  We will check a 2D echocardiogram.  No further work-up is necessary at this time.   Lorretta Harp, M.D., [place], [facility], [place]ver[place] [address] [place], [place]  [address]  [phone] 07/27/2019 5:49 PM

## 2019-07-27 NOTE — Plan of Care (Signed)

## 2019-07-28 ENCOUNTER — Inpatient Hospital Stay (HOSPITAL_COMMUNITY): Payer: PPO

## 2019-07-28 ENCOUNTER — Encounter (HOSPITAL_COMMUNITY): Payer: Self-pay | Admitting: Internal Medicine

## 2019-07-28 DIAGNOSIS — N179 Acute kidney failure, unspecified: Principal | ICD-10-CM

## 2019-07-28 DIAGNOSIS — I4891 Unspecified atrial fibrillation: Secondary | ICD-10-CM

## 2019-07-28 DIAGNOSIS — N3001 Acute cystitis with hematuria: Secondary | ICD-10-CM

## 2019-07-28 LAB — CBC WITH DIFFERENTIAL/PLATELET
Abs Immature Granulocytes: 0.04 10*3/uL (ref 0.00–0.07)
Basophils Absolute: 0 10*3/uL (ref 0.0–0.1)
Basophils Relative: 0 %
Eosinophils Absolute: 0 10*3/uL (ref 0.0–0.5)
Eosinophils Relative: 0 %
HCT: 28.1 % — ABNORMAL LOW (ref 36.0–46.0)
Hemoglobin: 8.9 g/dL — ABNORMAL LOW (ref 12.0–15.0)
Immature Granulocytes: 1 %
Lymphocytes Relative: 6 %
Lymphs Abs: 0.5 10*3/uL — ABNORMAL LOW (ref 0.7–4.0)
MCH: 29.2 pg (ref 26.0–34.0)
MCHC: 31.7 g/dL (ref 30.0–36.0)
MCV: 92.1 fL (ref 80.0–100.0)
Monocytes Absolute: 0.4 10*3/uL (ref 0.1–1.0)
Monocytes Relative: 5 %
Neutro Abs: 7.1 10*3/uL (ref 1.7–7.7)
Neutrophils Relative %: 88 %
Platelets: 399 10*3/uL (ref 150–400)
RBC: 3.05 MIL/uL — ABNORMAL LOW (ref 3.87–5.11)
RDW: 15 % (ref 11.5–15.5)
WBC: 8 10*3/uL (ref 4.0–10.5)
nRBC: 0.5 % — ABNORMAL HIGH (ref 0.0–0.2)

## 2019-07-28 LAB — ECHOCARDIOGRAM COMPLETE
Height: 62 in
Weight: 2239.87 oz

## 2019-07-28 LAB — BASIC METABOLIC PANEL
Anion gap: 11 (ref 5–15)
BUN: 68 mg/dL — ABNORMAL HIGH (ref 8–23)
CO2: 20 mmol/L — ABNORMAL LOW (ref 22–32)
Calcium: 7.4 mg/dL — ABNORMAL LOW (ref 8.9–10.3)
Chloride: 105 mmol/L (ref 98–111)
Creatinine, Ser: 4.24 mg/dL — ABNORMAL HIGH (ref 0.44–1.00)
GFR calc Af Amer: 10 mL/min — ABNORMAL LOW (ref >60)
GFR calc non Af Amer: 9 mL/min — ABNORMAL LOW (ref >60)
Glucose, Bld: 131 mg/dL — ABNORMAL HIGH (ref 70–99)
Potassium: 5.2 mmol/L — ABNORMAL HIGH (ref 3.5–5.1)
Sodium: 136 mmol/L (ref 135–145)

## 2019-07-28 LAB — URINALYSIS, ROUTINE W REFLEX MICROSCOPIC
Bilirubin Urine: NEGATIVE
Glucose, UA: 50 mg/dL — AB
Ketones, ur: NEGATIVE mg/dL
Leukocytes,Ua: NEGATIVE
Nitrite: NEGATIVE
Protein, ur: 100 mg/dL — AB
Specific Gravity, Urine: 1.024 (ref 1.005–1.030)
WBC, UA: >50 WBC/hpf — ABNORMAL HIGH (ref 0–5)
pH: 5 (ref 5.0–8.0)

## 2019-07-28 LAB — GLUCOSE, CAPILLARY
Glucose-Capillary: 139 mg/dL — ABNORMAL HIGH (ref 70–99)
Glucose-Capillary: 111 mg/dL — ABNORMAL HIGH (ref 70–99)
Glucose-Capillary: 132 mg/dL — ABNORMAL HIGH (ref 70–99)
Glucose-Capillary: 166 mg/dL — ABNORMAL HIGH (ref 70–99)

## 2019-07-28 LAB — COMPREHENSIVE METABOLIC PANEL
ALT: 15 U/L (ref 0–44)
AST: 23 U/L (ref 15–41)
Albumin: 2.9 g/dL — ABNORMAL LOW (ref 3.5–5.0)
Alkaline Phosphatase: 83 U/L (ref 38–126)
Anion gap: 15 (ref 5–15)
BUN: 66 mg/dL — ABNORMAL HIGH (ref 8–23)
CO2: 18 mmol/L — ABNORMAL LOW (ref 22–32)
Calcium: 7.3 mg/dL — ABNORMAL LOW (ref 8.9–10.3)
Chloride: 104 mmol/L (ref 98–111)
Creatinine, Ser: 4.23 mg/dL — ABNORMAL HIGH (ref 0.44–1.00)
GFR calc Af Amer: 10 mL/min — ABNORMAL LOW (ref >60)
GFR calc non Af Amer: 9 mL/min — ABNORMAL LOW (ref >60)
Glucose, Bld: 154 mg/dL — ABNORMAL HIGH (ref 70–99)
Potassium: 5.7 mmol/L — ABNORMAL HIGH (ref 3.5–5.1)
Sodium: 137 mmol/L (ref 135–145)
Total Bilirubin: 0.5 mg/dL (ref 0.3–1.2)
Total Protein: 5.7 g/dL — ABNORMAL LOW (ref 6.5–8.1)

## 2019-07-28 LAB — PHOSPHORUS: Phosphorus: 6 mg/dL — ABNORMAL HIGH (ref 2.5–4.6)

## 2019-07-28 LAB — TSH: TSH: 1.647 u[IU]/mL (ref 0.350–4.500)

## 2019-07-28 LAB — MAGNESIUM: Magnesium: 2.4 mg/dL (ref 1.7–2.4)

## 2019-07-28 LAB — FERRITIN: Ferritin: 88 ng/mL (ref 11–307)

## 2019-07-28 LAB — C-REACTIVE PROTEIN: CRP: 1.1 mg/dL — ABNORMAL HIGH (ref <1.0)

## 2019-07-28 LAB — D-DIMER, QUANTITATIVE: D-Dimer, Quant: 2.49 ug/mL-FEU — ABNORMAL HIGH (ref 0.00–0.50)

## 2019-07-28 LAB — OCCULT BLOOD X 1 CARD TO LAB, STOOL: Fecal Occult Bld: POSITIVE — AB

## 2019-07-28 MED ORDER — FUROSEMIDE 10 MG/ML IJ SOLN
20.0000 mg | Freq: Once | INTRAMUSCULAR | Status: AC
  Administered 2019-07-28: 11:00:00 20 mg via INTRAVENOUS
  Filled 2019-07-28: qty 2

## 2019-07-28 MED ORDER — CHLORHEXIDINE GLUCONATE CLOTH 2 % EX PADS
6.0000 | MEDICATED_PAD | Freq: Every day | CUTANEOUS | Status: DC
  Administered 2019-07-28 – 2019-07-30 (×3): 6 via TOPICAL

## 2019-07-28 MED ORDER — ORAL CARE MOUTH RINSE
15.0000 mL | Freq: Two times a day (BID) | OROMUCOSAL | Status: DC
  Administered 2019-07-29 – 2019-07-30 (×4): 15 mL via OROMUCOSAL

## 2019-07-28 MED ORDER — AMLODIPINE BESYLATE 10 MG PO TABS
10.0000 mg | ORAL_TABLET | Freq: Every day | ORAL | Status: DC
  Administered 2019-07-28 – 2019-07-30 (×3): 10 mg via ORAL
  Filled 2019-07-28 (×3): qty 1

## 2019-07-28 NOTE — TOC Initial Note (Signed)
Transition of Care [facility]) - Initial/Assessment Note    Patient Details  Name: Amber Ballard MRN: [id number] Date of Birth: 1931/09/10  [facility] CM/SW Contact:    Maryclare Labrador, RN Phone Number: 07/28/2019, 2:46 PM  Clinical Narrative:  PTA from home with daughter and son in law.  Per daughter pt uses walker in the home.  Daughter will transport pt home at discharge and confirms she will provide 24 hour supervision at discharge.  Daughter informed CM that pt is already active with [facility] - pt would like to remain with agency.  CM contacted agency and informed of admit - agency informed CM that they are aware pt is positive for covid and will accept pt back at discharge.  Agency request the following be faxed to [phone];  Discharge summary, home health order and face to face.  TOC will continue to follow for discharge needs               Expected Discharge Plan: [place]     Patient Goals and CMS Choice     Choice offered to / list presented to : Adult Children  Expected Discharge Plan and Services Expected Discharge Plan: [place]       Living arrangements for the past 2 months: Single Family Home                                      Prior Living Arrangements/Services Living arrangements for the past 2 months: Single Family Home Lives with:: Adult Children Patient language and need for interpreter reviewed:: Yes        Need for Family Participation in Patient Care: Yes (Comment) Care giver support system in place?: Yes (comment) Current home services: DME(walker) Criminal Activity/Legal Involvement Pertinent to Current Situation/Hospitalization: No - Comment as needed  Activities of Daily Living      Permission Sought/Granted   Permission granted to share information with : Yes, Verbal Permission Granted(verbal persmission given by daughter)     Permission granted to share info w  AGENCY: [facility]        Emotional Assessment              Admission diagnosis:  COVID-19 [U07.1] Patient Active Problem List   Diagnosis Date Noted  . COVID-19 07/27/2019  . ACS (acute coronary syndrome) ([place]) 07/27/2019  . Pneumonia of both lungs due to infectious organism   . Lung nodule 01/21/2019  . DKA (diabetic ketoacidoses) ([place]) 01/20/2019  . Multiple fractures of ribs, right side, initial encounter for closed fracture 01/20/2019  . CHF, acute on chronic ([place]) 01/20/2019  . Unspecified atrial fibrillation ([place]) 01/20/2019  . History of CVA (cerebrovascular accident) 01/20/2019  . Hypothyroidism    PCP:  Ocie Doyne., MD Pharmacy:   [facility], [address] [place] [place] [address] Phone: [phone] Fax: [phone]     Social Determinants of Health (SDOH) Interventions    Readmission Risk Interventions No flowsheet data found.

## 2019-07-28 NOTE — Progress Notes (Addendum)
PROGRESS NOTE    Amber Ballard    Code Status: DNR  BSJ:[id number] DOB: [birth year] DOA: 07/27/2019  PCP: Ocie Doyne., MD    Hospital Summary  This is an 85 year old female with past medical history of diabetes, hypertension, hyperlipidemia, diastolic heart failure, DJD status post right hip surgery 3 weeks ago who presented to [facility] on December 20 with increased shortness of breath, found to have hypoxia with saturation 86% on room air, positive COVID-19 screen found to have moderate bilateral pleural effusions on CT, patchy infiltrates without PE.  Started on remdesivir and Decadron however developed rapid atrial fibrillation.  Started on Cardizem drip with little response then amiodarone drip was added by cardiology at [facility] and Eliquis was also started.  [facility] ED she was noted to be on 2 L.  Converted to NSR with heart rate in low 50s and amiodarone and Cardizem stopped.  Cardiology consulted to help manage A. fib with RVR as well as antiplatelets and anticoagulation.  Per cardiology, concern for tachybradycardia syndrome with history of stroke in the past on low-dose aspirin and Plavix.  Given her age and history of falls as well as cognitive impairment she was deemed not a good candidate for NOAC and recommended decreasing metoprolol from 50 to 25 mg twice daily as well as amiodarone load 200 mg twice daily and decrease to 200 mg daily after 7 days.  Echo was ordered showed grade 3 diastolic dysfunction as well as EF 60 to 65%.   Of note, FOBT ordered from ED was positive.  This was discussed with GI over the phone who recommended conservative management at this time with outpatient work-up and consider inpatient CT abdomen pelvis with contrast however given her renal function will hold off currently.  A & P   Active Problems:   COVID-19   ACS (acute coronary syndrome) (HCC)   Pneumonia of both lungs due to infectious organism   Acute  hypoxic respiratory failure secondary to COVID-19 hypertensive, rate controlled, requiring 2 L nasal cannula, desats to 85% on room air.  CRP 1.1, D-dimer stable at 2.5 -Continue dexamethasone with last treatment 12/31 -Continue remdesivir with last treatment 12/27 -Trend inflammatory markers  Positive FOBT in setting of Eliquis, Plavix, aspirin.  Hemoglobin 8.9. - discussed with GI over the phone who recommended conservative management at this time with outpatient work-up and consider inpatient CT abdomen pelvis with contrast, however given her renal function will hold off currently -Hold Eliquis per cardiology  A. fib with RVR, currently rate controlled previously on Cardizem and amiodarone drip, converted to NSR and sinus bradycardia.  Drips discontinued in ED.  Echo was ordered showed grade 3 diastolic dysfunction as well as EF 60 to 65%. - Per cardiology, concern for tachybradycardia syndrome with history of stroke in the past on low-dose aspirin and Plavix.  Given her age and history of falls as well as cognitive impairment she was deemed not a good candidate for NOAC and recommended decreasing metoprolol from 50 to 25 mg twice daily as well as amiodarone load 200 mg twice daily and decrease to 200 mg daily after 7 days. -Continue telemetry  AKI on CKD 3/4 baseline creatinine 1.6-1.8, currently 4.24, possibly from recent dye load at [place] as well as hemodynamic changes.  With Foley catheter, given dose of Lasix this a.m. with some urine output.  UA with blood, rare bacteria, WBCs -Renal ultrasound -Hold further diuretics for now -Consider nephro consult in  a.m. if no improvement -Hold home losartan  Mild hyperkalemia K5.7, improved with Lasix to 5.2 -Monitor  Chronic diastolic heart failure grade 3 diastolic dysfunction on echo with EF 60 to 65%. -Daily weight, I/O  Hypertension -Restart amlodipine -Hold home losartan given AKI  Type 2 diabetes -Continue Levemir and sliding  scale and monitor for hypoglycemia in setting of renal dysfunction  UTI no reported urinary issues, A. fib likely from COVID-19 -Urine culture -will hold off on antibiotics at this time given asymptomatic but monitor closely -consider removing foley in am  Hypothyroid -Continue Synthroid   DVT prophylaxis: Aspirin and Plavix only, SCDs, holding systemic anticoagulation with positive FOBT Family Communication: No family at bedside Disposition Plan: pending clinical improvement  Consultants  Cardiology GI  Procedures  None  Antibiotics   Anti-infectives (From admission, onward)   Start     Dose/Rate Route Frequency Ordered Stop   07/28/19 1000  remdesivir 100 mg in sodium chloride 0.9 % 100 mL IVPB  Status:  Discontinued     100 mg 200 mL/hr over 30 Minutes Intravenous Daily 07/27/19 1316 07/27/19 1345   07/28/19 1000  remdesivir 100 mg in sodium chloride 0.9 % 100 mL IVPB     100 mg 200 mL/hr over 30 Minutes Intravenous Daily 07/27/19 1350 07/30/19 0959   07/27/19 1315  remdesivir 200 mg in sodium chloride 0.9% 250 mL IVPB  Status:  Discontinued     200 mg 580 mL/hr over 30 Minutes Intravenous Once 07/27/19 1316 07/27/19 1345           Subjective   Patient seen and examined at bedside no acute distress and resting comfortably.  Currently denies any complaints.  Denies any urinary issues or blood in stool.  Denies any chest pain or palpitations.  Remaining ROS negative   Objective   Vitals:   07/28/19 1000 07/28/19 1100 07/28/19 1145 07/28/19 1657  BP:   (!) 162/69 (!) 173/53  Pulse: 66 (!) 56 60   Resp: (!) 21 18 16    Temp:   97.9 F (36.6 C) 97.7 F (36.5 C)  TempSrc:   Oral Oral  SpO2: (!) 86% 94%    Weight:      Height:        Intake/Output Summary (Last 24 hours) at 07/28/2019 1709 Last data filed at 07/28/2019 1658 Gross per 24 hour  Intake 25 ml  Output 475 ml  Net -450 ml   Filed Weights   07/27/19 1300  Weight: 63.5 kg     Examination:  Physical Exam Vitals and nursing note reviewed. Exam conducted with a chaperone present.  Constitutional:      Appearance: She is not ill-appearing.  HENT:     Head: Normocephalic and atraumatic.  Cardiovascular:     Rate and Rhythm: Normal rate and regular rhythm.  Pulmonary:     Effort: Pulmonary effort is normal.     Breath sounds: Normal breath sounds.  Abdominal:     General: Abdomen is flat.     Palpations: Abdomen is soft.  Musculoskeletal:        General: No swelling.  Neurological:     General: No focal deficit present.     Mental Status: She is alert.  Psychiatric:        Mood and Affect: Mood normal.        Behavior: Behavior normal.     Data Reviewed: I have personally reviewed following labs and imaging studies  CBC: Recent Labs  Lab  07/28/19 0500  WBC 8.0  NEUTROABS 7.1  HGB 8.9*  HCT 28.1*  MCV 92.1  PLT 254   Basic Metabolic Panel: Recent Labs  Lab 07/28/19 0500 07/28/19 1400  NA 137 136  K 5.7* 5.2*  CL 104 105  CO2 18* 20*  GLUCOSE 154* 131*  BUN 66* 68*  CREATININE 4.23* 4.24*  CALCIUM 7.3* 7.4*  MG 2.4  --   PHOS 6.0*  --    GFR: Estimated Creatinine Clearance: 8.5 mL/min (A) (by C-G formula based on SCr of 4.24 mg/dL (H)). Liver Function Tests: Recent Labs  Lab 07/28/19 0500  AST 23  ALT 15  ALKPHOS 83  BILITOT 0.5  PROT 5.7*  ALBUMIN 2.9*   No results for input(s): LIPASE, AMYLASE in the last 168 hours. No results for input(s): AMMONIA in the last 168 hours. Coagulation Profile: No results for input(s): INR, PROTIME in the last 168 hours. Cardiac Enzymes: No results for input(s): CKTOTAL, CKMB, CKMBINDEX, TROPONINI in the last 168 hours. BNP (last 3 results) No results for input(s): PROBNP in the last 8760 hours. HbA1C: Recent Labs    07/27/19 1346  HGBA1C 7.4*   CBG: Recent Labs  Lab 07/27/19 1721 07/27/19 2123 07/28/19 0740 07/28/19 1144 07/28/19 1655  GLUCAP 210* 144* 139* 111* 132*    Lipid Profile: No results for input(s): CHOL, HDL, LDLCALC, TRIG, CHOLHDL, LDLDIRECT in the last 72 hours. Thyroid Function Tests: Recent Labs    07/28/19 0500  TSH 1.647   Anemia Panel: Recent Labs    07/28/19 0500  FERRITIN 88   Sepsis Labs: No results for input(s): PROCALCITON, LATICACIDVEN in the last 168 hours.  No results found for this or any previous visit (from the past 240 hour(s)).       Radiology Studies: DG Chest 1 View  Result Date: 07/27/2019 CLINICAL DATA:  Pneumonia EXAM: CHEST  1 VIEW COMPARISON:  07/24/2019 chest radiograph. FINDINGS: Stable cardiomediastinal silhouette with mild cardiomegaly. No pneumothorax. Stable small bilateral pleural effusions, left greater than right. Patchy apical right and parahilar left mid lung opacities, mildly increased. IMPRESSION: 1. Mildly increased patchy apical right in parahilar left mid lung opacities, compatible with reported pneumonia versus mild pulmonary edema. 2. Small stable bilateral pleural effusions, left greater than right. 3. Mild cardiomegaly. Electronically Signed   By: Ilona Sorrel M.D.   On: 07/27/2019 14:31   ECHOCARDIOGRAM COMPLETE  Result Date: 07/28/2019   ECHOCARDIOGRAM REPORT   Patient Name:   Amber Ballard Date of Exam: 07/28/2019 Medical Rec #:  [id number]    Height:       62.0 in Accession #:    [id number]   Weight:       140.0 lb Date of Birth:  [birth year]    BSA:          1.64 m Patient Age:    84 years     BP:           186/63 mmHg Patient Gender: F            HR:           66 bpm. Exam Location:  Inpatient Procedure: 2D Echo, Cardiac Doppler and Color Doppler Indications:    Atrial fibrillation  History:        Patient has no prior history of Echocardiogram examinations.                 CHF, Stroke, Arrythmias:Atrial Fibrillation; Risk  Factors:Hypertension and Diabetes. Breast cancer.  Sonographer:    Dustin Flock Referring Phys: [place]  1. Left  ventricular ejection fraction, by visual estimation, is 60 to 65%. The left ventricle has normal function. There is no left ventricular hypertrophy.  2. Left ventricular diastolic parameters are consistent with Grade III diastolic dysfunction (restrictive).  3. Global right ventricle has normal systolic function.The right ventricular size is normal.  4. Left atrial size was normal.  5. Right atrial size was normal.  6. Moderate pleural effusion in the left lateral region.  7. Moderate mitral annular calcification.  8. The mitral valve is normal in structure. Mild mitral valve regurgitation. No evidence of mitral stenosis.  9. The tricuspid valve is normal in structure. 10. The aortic valve is tricuspid. Aortic valve regurgitation is not visualized. Mild aortic valve sclerosis without stenosis. 11. The pulmonic valve was normal in structure. Pulmonic valve regurgitation is not visualized. 12. The inferior vena cava is dilated in size with >50% respiratory variability, suggesting right atrial pressure of 8 mmHg. 13. Normal LV function; restrictive filling; mild MR; left pleural effusion. 14. The left ventricle has no regional wall motion abnormalities. FINDINGS  Left Ventricle: Left ventricular ejection fraction, by visual estimation, is 60 to 65%. The left ventricle has normal function. The left ventricle has no regional wall motion abnormalities. There is no left ventricular hypertrophy. Left ventricular diastolic parameters are consistent with Grade III diastolic dysfunction (restrictive). Normal left atrial pressure. Right Ventricle: The right ventricular size is normal.Global RV systolic function is has normal systolic function. Left Atrium: Left atrial size was normal in size. Right Atrium: Right atrial size was normal in size Pericardium: There is no evidence of pericardial effusion. There is a moderate pleural effusion in the left lateral region. Mitral Valve: The mitral valve is normal in structure. Moderate  mitral annular calcification. Mild mitral valve regurgitation. No evidence of mitral valve stenosis by observation. MV peak gradient, 15.7 mmHg. Tricuspid Valve: The tricuspid valve is normal in structure. Tricuspid valve regurgitation is trivial. Aortic Valve: The aortic valve is tricuspid. Aortic valve regurgitation is not visualized. Mild aortic valve sclerosis is present, with no evidence of aortic valve stenosis. Pulmonic Valve: The pulmonic valve was normal in structure. Pulmonic valve regurgitation is not visualized. Pulmonic regurgitation is not visualized. Aorta: The aortic root is normal in size and structure. Venous: The inferior vena cava is dilated in size with greater than 50% respiratory variability, suggesting right atrial pressure of 8 mmHg.  Additional Comments: Normal LV function; restrictive filling; mild MR; left pleural effusion.  LEFT VENTRICLE PLAX 2D LVIDd:         4.70 cm Diastology LVIDs:         2.90 cm LV e' lateral:   7.94 cm/s LV PW:         1.00 cm LV E/e' lateral: 23.6 LV IVS:        1.10 cm LV e' medial:    7.94 cm/s LV SV:         70 ml   LV E/e' medial:  23.6 LV SV Index:   41.74  RIGHT VENTRICLE RV S prime:     11.10 cm/s LEFT ATRIUM         Index LA diam:    4.20 cm 2.56 cm/m   AORTA Ao Root diam: 2.50 cm MITRAL VALVE MV Area (PHT): 4.21 cm MV Peak grad:  15.7 mmHg MV Mean grad:  4.0 mmHg MV Vmax:  1.98 m/s MV Vmean:      81.1 cm/s MV VTI:        0.44 m MV PHT:        52.20 msec MV Decel Time: 180 msec MV E velocity: 187.00 cm/s 103 cm/s MV A velocity: 62.20 cm/s  70.3 cm/s MV E/A ratio:  3.01        1.5  Kirk Ruths MD Electronically signed by Kirk Ruths MD Signature Date/Time: 07/28/2019/12:10:49 PM    Final         Scheduled Meds: . amiodarone  200 mg Oral BID  . amLODipine  10 mg Oral Daily  . aspirin EC  81 mg Oral Daily  . atorvastatin  80 mg Oral q1800  . Chlorhexidine Gluconate Cloth  6 each Topical Daily  . clopidogrel  75 mg Oral Daily  .  dexamethasone  6 mg Oral Q24H  . insulin aspart  0-15 Units Subcutaneous TID WC  . insulin aspart  0-5 Units Subcutaneous QHS  . insulin detemir  15 Units Subcutaneous QHS  . levothyroxine  75 mcg Oral Q0600  . metoprolol tartrate  25 mg Oral BID  . vitamin B-12  1,000 mcg Oral Daily   Continuous Infusions: . remdesivir 100 mg in NS 100 mL 100 mg (07/28/19 0945)     LOS: 1 day    Time spent: 25 minutes with over 50% of the time coordinating the patient's care    Harold Hedge, DO [facility] Pager [phone]  If 7PM-7AM, please contact night-coverage [email or web address] Password [facility] 07/28/2019, 5:09 PM

## 2019-07-28 NOTE — Progress Notes (Signed)
  Echocardiogram 2D Echocardiogram has been performed.  Amber Ballard 07/28/2019, 10:11 AM

## 2019-07-28 NOTE — Evaluation (Signed)
Physical Therapy Evaluation Patient Details Name: Amber Ballard MRN: [id number] DOB: 1932/08/01 Today's Date: 07/28/2019   History of Present Illness  Amber Ballard a 85 y.o.femalewith a hx of breast CA, diabetes mellitus type 2, HTN, anxiety, CVA1/2019, hypothyroidism, CKD stageIII(prior baseline reported 1.1-1.3), paroxysmal atrial fibrillation, bradycardia,chronic diastolic CHFand fallswho is being seen today for the evaluation ofatrial fibrillationat the request of Dr. Roosevelt Locks.She was admitted with hypoxia and Covid-19 infection.  Per chart, pt with recent ORIF right hip on Thanksgiving after a fall.   Clinical Impression  Pt admitted with above diagnosis. Pt was able to stand and pivot x 2 with min assist with bil UE support.  VSS - see below.  Tried to call daughter to clarify that she will assist at home as well as to try and inquire about if pt has any weight bearing restrictions but no answer at numbers listed in chart.   Pt currently with functional limitations due to the deficits listed below (see PT Problem List). Pt will benefit from skilled PT to increase their independence and safety with mobility to allow discharge to the venue listed below.      Follow Up Recommendations Home health PT;Supervision/Assistance - 60 hour(if daughter and husband can assist pt 24 hours)    Equipment Recommendations  None recommended by PT    Recommendations for Other Services       Precautions / Restrictions Precautions Precautions: Fall Restrictions Other Position/Activity Restrictions: Pivot transfer until weight bearing can be clarified      Mobility  Bed Mobility Overal bed mobility: Needs Assistance Bed Mobility: Supine to Sit     Supine to sit: Min guard     General bed mobility comments: guiding assist for pt tp come to EOB  Transfers Overall transfer level: Needs assistance Equipment used: 2 person hand held assist Transfers: Sit to/from Stand;Squat Pivot  Transfers;Stand Pivot Transfers Sit to Stand: Min assist Stand pivot transfers: Min assist Squat pivot transfers: Min assist     General transfer comment: Pt able to stand pivot to 3N1 and had BM and then squat pivot to bed after PT cleaned pt.  Then pt stand pivot to reclienr with min assist with 2 person assist.  Pt holding onto PT's UEs to pivot each time and able to move feet as was fairly steady with pivot.   Ambulation/Gait                Stairs            Wheelchair Mobility    Modified Rankin (Stroke Patients Only)       Balance Overall balance assessment: Needs assistance Sitting-balance support: No upper extremity supported;Feet supported Sitting balance-Leahy Scale: Fair     Standing balance support: Bilateral upper extremity supported;During functional activity Standing balance-Leahy Scale: Poor Standing balance comment: relies on bil UE support                             Pertinent Vitals/Pain Pain Assessment: No/denies pain    Home Living Family/patient expects to be discharged to:: Private residence Living Arrangements: Children(lives with daughter and son in law) Available Help at Discharge: Available 24 hours/day Type of Home: House Home Access: Stairs to enter Entrance Stairs-Rails: None Entrance Stairs-Number of Steps: 1 Home Layout: One level Home Equipment: Environmental consultant - 2 wheels;Cane - single point;Bedside commode;Shower seat;Wheelchair - manual;Hospital bed      Prior Function Level of Independence: Independent with assistive device(s)  Hand Dominance   Dominant Hand: Right    Extremity/Trunk Assessment   Upper Extremity Assessment Upper Extremity Assessment: Defer to OT evaluation    Lower Extremity Assessment Lower Extremity Assessment: Generalized weakness    Cervical / Trunk Assessment Cervical / Trunk Assessment: Kyphotic  Communication   Communication: No difficulties  Cognition  Arousal/Alertness: Awake/alert Behavior During Therapy: WFL for tasks assessed/performed Overall Cognitive Status: History of cognitive impairments - at baseline                                        General Comments General comments (skin integrity, edema, etc.): 62 bpm-80 bpm, 94% O2 on 2L at rest, 92% on 2L with activity; 175/73 initially and 191/70 at end of treatment and nurse made aware of elevated BP.      Exercises     Assessment/Plan    PT Assessment Patient needs continued PT services  PT Problem List Decreased activity tolerance;Decreased balance;Decreased mobility;Decreased knowledge of use of DME;Decreased safety awareness;Decreased knowledge of precautions       PT Treatment Interventions DME instruction;Gait training;Functional mobility training;Therapeutic activities;Therapeutic exercise;Balance training;Patient/family education    PT Goals (Current goals can be found in the Care Plan section)  Acute Rehab PT Goals Patient Stated Goal: to go home PT Goal Formulation: With patient Time For Goal Achievement: 08/11/19 Potential to Achieve Goals: Good    Frequency Min 3X/week   Barriers to discharge        Co-evaluation               AM-PAC PT "6 Clicks" Mobility  Outcome Measure Help needed turning from your back to your side while in a flat bed without using bedrails?: None Help needed moving from lying on your back to sitting on the side of a flat bed without using bedrails?: A Little Help needed moving to and from a bed to a chair (including a wheelchair)?: A Little Help needed standing up from a chair using your arms (e.g., wheelchair or bedside chair)?: A Little Help needed to walk in hospital room?: A Lot Help needed climbing 3-5 steps with a railing? : A Lot 6 Click Score: 17    End of Session Equipment Utilized During Treatment: Gait belt;Oxygen Activity Tolerance: Patient limited by fatigue Patient left: in chair;with call  bell/phone within reach;with chair alarm set Nurse Communication: Mobility status PT Visit Diagnosis: Unsteadiness on feet (R26.81);Muscle weakness (generalized) (M62.81)    Time: 7893-8101 PT Time Calculation (min) (ACUTE ONLY): 27 min   Charges:   PT Evaluation $PT Eval Moderate Complexity: 1 Mod PT Treatments $Therapeutic Activity: 8-22 mins        Elize Pinon W,PT Acute Rehabilitation Services Pager:  [phone]  Office:  [phone]    Denice Paradise 07/28/2019, 1:11 PM

## 2019-07-29 ENCOUNTER — Other Ambulatory Visit: Payer: Self-pay

## 2019-07-29 ENCOUNTER — Inpatient Hospital Stay (HOSPITAL_COMMUNITY): Payer: PPO

## 2019-07-29 DIAGNOSIS — Z978 Presence of other specified devices: Secondary | ICD-10-CM

## 2019-07-29 LAB — COMPREHENSIVE METABOLIC PANEL
ALT: 18 U/L (ref 0–44)
AST: 22 U/L (ref 15–41)
Albumin: 2.5 g/dL — ABNORMAL LOW (ref 3.5–5.0)
Alkaline Phosphatase: 81 U/L (ref 38–126)
Anion gap: 14 (ref 5–15)
BUN: 73 mg/dL — ABNORMAL HIGH (ref 8–23)
CO2: 21 mmol/L — ABNORMAL LOW (ref 22–32)
Calcium: 7.2 mg/dL — ABNORMAL LOW (ref 8.9–10.3)
Chloride: 104 mmol/L (ref 98–111)
Creatinine, Ser: 4.54 mg/dL — ABNORMAL HIGH (ref 0.44–1.00)
GFR calc Af Amer: 10 mL/min — ABNORMAL LOW (ref >60)
GFR calc non Af Amer: 8 mL/min — ABNORMAL LOW (ref >60)
Glucose, Bld: 167 mg/dL — ABNORMAL HIGH (ref 70–99)
Potassium: 4.9 mmol/L (ref 3.5–5.1)
Sodium: 139 mmol/L (ref 135–145)
Total Bilirubin: 0.5 mg/dL (ref 0.3–1.2)
Total Protein: 5 g/dL — ABNORMAL LOW (ref 6.5–8.1)

## 2019-07-29 LAB — C-REACTIVE PROTEIN: CRP: 0.8 mg/dL (ref <1.0)

## 2019-07-29 LAB — CBC WITH DIFFERENTIAL/PLATELET
Abs Immature Granulocytes: 0.06 10*3/uL (ref 0.00–0.07)
Basophils Absolute: 0 10*3/uL (ref 0.0–0.1)
Basophils Relative: 0 %
Eosinophils Absolute: 0 10*3/uL (ref 0.0–0.5)
Eosinophils Relative: 0 %
HCT: 26 % — ABNORMAL LOW (ref 36.0–46.0)
Hemoglobin: 8.2 g/dL — ABNORMAL LOW (ref 12.0–15.0)
Immature Granulocytes: 1 %
Lymphocytes Relative: 5 %
Lymphs Abs: 0.4 10*3/uL — ABNORMAL LOW (ref 0.7–4.0)
MCH: 28.6 pg (ref 26.0–34.0)
MCHC: 31.5 g/dL (ref 30.0–36.0)
MCV: 90.6 fL (ref 80.0–100.0)
Monocytes Absolute: 0.5 10*3/uL (ref 0.1–1.0)
Monocytes Relative: 7 %
Neutro Abs: 6.1 10*3/uL (ref 1.7–7.7)
Neutrophils Relative %: 87 %
Platelets: 346 10*3/uL (ref 150–400)
RBC: 2.87 MIL/uL — ABNORMAL LOW (ref 3.87–5.11)
RDW: 15 % (ref 11.5–15.5)
WBC: 7 10*3/uL (ref 4.0–10.5)
nRBC: 0.6 % — ABNORMAL HIGH (ref 0.0–0.2)

## 2019-07-29 LAB — GLUCOSE, CAPILLARY
Glucose-Capillary: 124 mg/dL — ABNORMAL HIGH (ref 70–99)
Glucose-Capillary: 81 mg/dL (ref 70–99)
Glucose-Capillary: 74 mg/dL (ref 70–99)
Glucose-Capillary: 215 mg/dL — ABNORMAL HIGH (ref 70–99)

## 2019-07-29 LAB — D-DIMER, QUANTITATIVE: D-Dimer, Quant: 1.91 ug/mL-FEU — ABNORMAL HIGH (ref 0.00–0.50)

## 2019-07-29 LAB — FERRITIN: Ferritin: 85 ng/mL (ref 11–307)

## 2019-07-29 LAB — PHOSPHORUS: Phosphorus: 6.5 mg/dL — ABNORMAL HIGH (ref 2.5–4.6)

## 2019-07-29 LAB — URINE CULTURE: Culture: 40000 — AB

## 2019-07-29 LAB — MAGNESIUM: Magnesium: 2.5 mg/dL — ABNORMAL HIGH (ref 1.7–2.4)

## 2019-07-29 MED ORDER — INSULIN ASPART 100 UNIT/ML ~~LOC~~ SOLN
0.0000 [IU] | Freq: Three times a day (TID) | SUBCUTANEOUS | Status: DC
  Administered 2019-07-30: 18:00:00 1 [IU] via SUBCUTANEOUS
  Administered 2019-07-31: 17:00:00 3 [IU] via SUBCUTANEOUS
  Administered 2019-07-31 (×2): 2 [IU] via SUBCUTANEOUS

## 2019-07-29 MED ORDER — SODIUM CHLORIDE 0.9 % IV SOLN
100.0000 mg | Freq: Every day | INTRAVENOUS | Status: DC
  Administered 2019-07-30 – 2019-07-31 (×2): 100 mg via INTRAVENOUS
  Filled 2019-07-29 (×2): qty 20

## 2019-07-29 NOTE — Progress Notes (Signed)
PROGRESS NOTE    Amber Ballard    Code Status: DNR  UDJ:[id number] DOB: [birth year] DOA: 07/27/2019  PCP: Ocie Doyne., MD    Hospital Summary  This is an 85 year old female with past medical history of diabetes, hypertension, hyperlipidemia, diastolic heart failure, DJD status post right hip surgery 3 weeks ago who presented to [facility] on December 20 with increased shortness of breath, found to have hypoxia with saturation 86% on room air, positive COVID-19 screen found to have moderate bilateral pleural effusions on CT, patchy infiltrates without PE.  Started on remdesivir and Decadron however developed rapid atrial fibrillation.  Started on Cardizem drip with little response then amiodarone drip was added by cardiology at [facility] and Eliquis was also started.  [facility] ED she was noted to be on 2 L.  Converted to NSR with heart rate in low 50s and amiodarone and Cardizem stopped.  Cardiology consulted to help manage A. fib with RVR as well as antiplatelets and anticoagulation.  Per cardiology, concern for tachybradycardia syndrome with history of stroke in the past on low-dose aspirin and Plavix.  Given her age and history of falls as well as cognitive impairment she was deemed not a good candidate for NOAC and recommended decreasing metoprolol from 50 to 25 mg twice daily as well as amiodarone load 200 mg twice daily and decrease to 200 mg daily after 7 days.  Echo was ordered showed grade 3 diastolic dysfunction as well as EF 60 to 65%.   Of note, FOBT ordered from ED was positive.  This was discussed with GI over the phone who recommended conservative management at this time with outpatient work-up and consider inpatient CT abdomen pelvis with contrast however given her renal function will hold off currently.  Nephrology consulted for AKI on 12/24  A & P   Active Problems:   HYIFO-[id number]   ACS (acute coronary syndrome) ([place])   Pneumonia of both  lungs due to infectious organism   Acute hypoxic respiratory failure secondary to COVID-19 hypertensive, rate controlled, still requiring 2 L nasal cannula CRP 1.1->0.8, D-dimer at 2.5->1.91 -Continue dexamethasone with last treatment 12/31 -Continue remdesivir with last treatment 12/27 -Trend inflammatory markers  Positive FOBT in setting of Eliquis, Plavix, aspirin.  Hemoglobin 8.9. - discussed with GI over the phone who recommended conservative management at this time with outpatient work-up and consider inpatient CT abdomen pelvis with contrast, however given her renal function will hold off currently -Hold Eliquis per cardiology  A. fib with RVR, currently rate controlled previously on Cardizem and amiodarone drip, converted to NSR and sinus bradycardia.  Drips discontinued in ED.  Echo was ordered showed grade 3 diastolic dysfunction as well as EF 60 to 65%. - Per cardiology, concern for tachybradycardia syndrome with history of stroke in the past on low-dose aspirin and Plavix.  Given her age and history of falls as well as cognitive impairment she was deemed not a good candidate for NOAC and recommended decreasing metoprolol from 50 to 25 mg twice daily as well as amiodarone load 200 mg twice daily and decrease to 200 mg daily after 7 days. -Continue telemetry  AKI on CKD 3/4 baseline creatinine 1.6-1.8, increased from 4.2-> 4.5 today, possibly from recent dye load at [place] as well as hemodynamic changes.  Renal ultrasound with increased echogenicity throughout right kidney, left kidney unremarkable.  With Foley catheter. -Nephrology consulted: Hold fluids and diuretics and continue to hold ARB.  Follow-up  in a.m.  Foley catheter appears to have been placed at [facility] for unknown reason -Void trial  Mild hyperkalemia improved with Lasix, K4.9 -Monitor  Chronic diastolic heart failure grade 3 diastolic dysfunction on echo with EF 60 to 65%. -Daily weight,  I/O  Hypertension -Continue amlodipine -Hold home losartan given AKI  Type 2 diabetes Blood sugar 74 this evening.  Likely from insulin in setting of poor renal function -Change sliding scale to renal sliding scale -Continue long-acting insulin  Asymptomatic GBS UTI  -No role for antibiotics at this time unless she becomes symptomatic -Remove Foley   Hypothyroid -Continue Synthroid   DVT prophylaxis: Aspirin and Plavix only, SCDs, holding systemic anticoagulation with positive FOBT Family Communication: Called patient's daughter, no response Disposition Plan: Barriers to discharge: renal function.  Will need home health at discharge.  Suspect discharge in 2 to 3 days  Consultants  Cardiology GI Nephrology  Procedures  None  Antibiotics   Anti-infectives (From admission, onward)   Start     Dose/Rate Route Frequency Ordered Stop   07/30/19 1000  remdesivir 100 mg in sodium chloride 0.9 % 100 mL IVPB     100 mg 200 mL/hr over 30 Minutes Intravenous Daily 07/29/19 1255 08/01/19 2359   07/28/19 1000  remdesivir 100 mg in sodium chloride 0.9 % 100 mL IVPB  Status:  Discontinued     100 mg 200 mL/hr over 30 Minutes Intravenous Daily 07/27/19 1316 07/27/19 1345   07/28/19 1000  remdesivir 100 mg in sodium chloride 0.9 % 100 mL IVPB     100 mg 200 mL/hr over 30 Minutes Intravenous Daily 07/27/19 1350 07/29/19 0941   07/27/19 1315  remdesivir 200 mg in sodium chloride 0.9% 250 mL IVPB  Status:  Discontinued     200 mg 580 mL/hr over 30 Minutes Intravenous Once 07/27/19 1316 07/27/19 1345           Subjective   Patient seen and examined at bedside no acute distress resting comfortably.  She denies any complaints at this time.  States she does not typically have a Foley catheter at home.  Denies any recent dysuria, hematuria.  States she feels fine. Objective   Vitals:   07/29/19 0904 07/29/19 1316 07/29/19 1357 07/29/19 1600  BP:  (!) 166/62 (!) 152/53 (!) 161/55   Pulse: 65 69 60 65  Resp:  15    Temp:  98 F (36.7 C) 98.2 F (36.8 C)   TempSrc:  Oral Oral   SpO2:  96% 96% 97%  Weight:      Height:        Intake/Output Summary (Last 24 hours) at 07/29/2019 1713 Last data filed at 07/29/2019 1100 Gross per 24 hour  Intake 375 ml  Output 425 ml  Net -50 ml   Filed Weights   07/27/19 1300 07/29/19 0500  Weight: 63.5 kg 66.6 kg    Examination:  Physical Exam Vitals and nursing note reviewed.  Constitutional:      Appearance: Normal appearance.  HENT:     Head: Normocephalic and atraumatic.     Nose: Nose normal.     Mouth/Throat:     Mouth: Mucous membranes are moist.  Eyes:     Extraocular Movements: Extraocular movements intact.  Cardiovascular:     Rate and Rhythm: Normal rate and regular rhythm.  Pulmonary:     Effort: Pulmonary effort is normal.     Breath sounds: Normal breath sounds.  Abdominal:     General:  Abdomen is flat.     Palpations: Abdomen is soft.  Genitourinary:    Comments: Foley Musculoskeletal:        General: No swelling. Normal range of motion.     Cervical back: Normal range of motion. No rigidity.  Neurological:     General: No focal deficit present.     Mental Status: She is alert. Mental status is at baseline.  Psychiatric:        Mood and Affect: Mood normal.        Behavior: Behavior normal.     Data Reviewed: I have personally reviewed following labs and imaging studies  CBC: Recent Labs  Lab 07/28/19 0500 07/29/19 0315  WBC 8.0 7.0  NEUTROABS 7.1 6.1  HGB 8.9* 8.2*  HCT 28.1* 26.0*  MCV 92.1 90.6  PLT 399 166   Basic Metabolic Panel: Recent Labs  Lab 07/28/19 0500 07/28/19 1400 07/29/19 0315  NA 137 136 139  K 5.7* 5.2* 4.9  CL 104 105 104  CO2 18* 20* 21*  GLUCOSE 154* 131* 167*  BUN 66* 68* 73*  CREATININE 4.23* 4.24* 4.54*  CALCIUM 7.3* 7.4* 7.2*  MG 2.4  --  2.5*  PHOS 6.0*  --  6.5*   GFR: Estimated Creatinine Clearance: 8.1 mL/min (A) (by C-G formula  based on SCr of 4.54 mg/dL (H)). Liver Function Tests: Recent Labs  Lab 07/28/19 0500 07/29/19 0315  AST 23 22  ALT 15 18  ALKPHOS 83 81  BILITOT 0.5 0.5  PROT 5.7* 5.0*  ALBUMIN 2.9* 2.5*   No results for input(s): LIPASE, AMYLASE in the last 168 hours. No results for input(s): AMMONIA in the last 168 hours. Coagulation Profile: No results for input(s): INR, PROTIME in the last 168 hours. Cardiac Enzymes: No results for input(s): CKTOTAL, CKMB, CKMBINDEX, TROPONINI in the last 168 hours. BNP (last 3 results) No results for input(s): PROBNP in the last 8760 hours. HbA1C: Recent Labs    07/27/19 1346  HGBA1C 7.4*   CBG: Recent Labs  Lab 07/28/19 1655 07/28/19 2041 07/29/19 0756 07/29/19 1149 07/29/19 1701  GLUCAP 132* 166* 124* 81 74   Lipid Profile: No results for input(s): CHOL, HDL, LDLCALC, TRIG, CHOLHDL, LDLDIRECT in the last 72 hours. Thyroid Function Tests: Recent Labs    07/28/19 0500  TSH 1.647   Anemia Panel: Recent Labs    07/28/19 0500 07/29/19 0315  FERRITIN 88 85   Sepsis Labs: No results for input(s): PROCALCITON, LATICACIDVEN in the last 168 hours.  Recent Results (from the past 240 hour(s))  Culture, Urine     Status: Abnormal   Collection Time: 07/28/19  5:27 PM   Specimen: Urine, Random  Result Value Ref Range Status   Specimen Description URINE, RANDOM  Final   Special Requests NONE  Final   Culture (A)  Final    40,000 COLONIES/mL GROUP B STREP(S.AGALACTIAE)ISOLATED TESTING AGAINST S. AGALACTIAE NOT ROUTINELY PERFORMED DUE TO PREDICTABILITY OF AMP/PEN/VAN SUSCEPTIBILITY. Performed at [facility], [place] [address], [place], [place] [address]    Report Status 07/29/2019 FINAL  Final         Radiology Studies: US RENAL  Result Date: 07/29/2019 CLINICAL DATA:  Acute kidney injury EXAM: RENAL / URINARY TRACT ULTRASOUND COMPLETE COMPARISON:  Ultrasound 07/26/2019 FINDINGS: Right Kidney: Renal measurements: 10.7 x 4.6 x  3.4 cm = volume: 89.3 mL. Diffusely increased echogenicity with slight loss of the corticomedullary differentiation. No visible renal calculus or hydronephrosis. No worrisome right renal  lesion. Left Kidney: Renal measurements: 7.6 x 3.8 x 3.0 cm = volume: 44.7 mL. Echogenicity is within normal limits. No shadowing calculus, hydronephrosis or concerning renal mass. Bladder: Bladder is decompressed by inflated Foley catheter and therefore poorly assessed at the time of imaging. Other: None. IMPRESSION: Increased right renal cortical echogenicity and loss of corticomedullary differentiation compatible with medical renal disease. Left kidney is a more normal appearance. No other concerning renal abnormality. Bladder decompressed by inflated Foley catheter. Electronically Signed   By: Lovena Le M.D.   On: 07/29/2019 03:52   ECHOCARDIOGRAM COMPLETE  Result Date: 07/28/2019   ECHOCARDIOGRAM REPORT   Patient Name:   AUBURN HESTER Date of Exam: 07/28/2019 Medical Rec #:  [id number]    Height:       62.0 in Accession #:    [id number]   Weight:       140.0 lb Date of Birth:  Oct 18, 1931    BSA:          1.64 m Patient Age:    35 years     BP:           186/63 mmHg Patient Gender: F            HR:           66 bpm. Exam Location:  Inpatient Procedure: 2D Echo, Cardiac Doppler and Color Doppler Indications:    Atrial fibrillation  History:        Patient has no prior history of Echocardiogram examinations.                 CHF, Stroke, Arrythmias:Atrial Fibrillation; Risk                 Factors:Hypertension and Diabetes. Breast cancer.  Sonographer:    Dustin Flock Referring Phys: [place]  1. Left ventricular ejection fraction, by visual estimation, is 60 to 65%. The left ventricle has normal function. There is no left ventricular hypertrophy.  2. Left ventricular diastolic parameters are consistent with Grade III diastolic dysfunction (restrictive).  3. Global right ventricle has normal systolic  function.The right ventricular size is normal.  4. Left atrial size was normal.  5. Right atrial size was normal.  6. Moderate pleural effusion in the left lateral region.  7. Moderate mitral annular calcification.  8. The mitral valve is normal in structure. Mild mitral valve regurgitation. No evidence of mitral stenosis.  9. The tricuspid valve is normal in structure. 10. The aortic valve is tricuspid. Aortic valve regurgitation is not visualized. Mild aortic valve sclerosis without stenosis. 11. The pulmonic valve was normal in structure. Pulmonic valve regurgitation is not visualized. 12. The inferior vena cava is dilated in size with >50% respiratory variability, suggesting right atrial pressure of 8 mmHg. 13. Normal LV function; restrictive filling; mild MR; left pleural effusion. 14. The left ventricle has no regional wall motion abnormalities. FINDINGS  Left Ventricle: Left ventricular ejection fraction, by visual estimation, is 60 to 65%. The left ventricle has normal function. The left ventricle has no regional wall motion abnormalities. There is no left ventricular hypertrophy. Left ventricular diastolic parameters are consistent with Grade III diastolic dysfunction (restrictive). Normal left atrial pressure. Right Ventricle: The right ventricular size is normal.Global RV systolic function is has normal systolic function. Left Atrium: Left atrial size was normal in size. Right Atrium: Right atrial size was normal in size Pericardium: There is no evidence of pericardial effusion. There is a moderate pleural effusion in  the left lateral region. Mitral Valve: The mitral valve is normal in structure. Moderate mitral annular calcification. Mild mitral valve regurgitation. No evidence of mitral valve stenosis by observation. MV peak gradient, 15.7 mmHg. Tricuspid Valve: The tricuspid valve is normal in structure. Tricuspid valve regurgitation is trivial. Aortic Valve: The aortic valve is tricuspid. Aortic valve  regurgitation is not visualized. Mild aortic valve sclerosis is present, with no evidence of aortic valve stenosis. Pulmonic Valve: The pulmonic valve was normal in structure. Pulmonic valve regurgitation is not visualized. Pulmonic regurgitation is not visualized. Aorta: The aortic root is normal in size and structure. Venous: The inferior vena cava is dilated in size with greater than 50% respiratory variability, suggesting right atrial pressure of 8 mmHg.  Additional Comments: Normal LV function; restrictive filling; mild MR; left pleural effusion.  LEFT VENTRICLE PLAX 2D LVIDd:         4.70 cm Diastology LVIDs:         2.90 cm LV e' lateral:   7.94 cm/s LV PW:         1.00 cm LV E/e' lateral: 23.6 LV IVS:        1.10 cm LV e' medial:    7.94 cm/s LV SV:         70 ml   LV E/e' medial:  23.6 LV SV Index:   41.74  RIGHT VENTRICLE RV S prime:     11.10 cm/s LEFT ATRIUM         Index LA diam:    4.20 cm 2.56 cm/m   AORTA Ao Root diam: 2.50 cm MITRAL VALVE MV Area (PHT): 4.21 cm MV Peak grad:  15.7 mmHg MV Mean grad:  4.0 mmHg MV Vmax:       1.98 m/s MV Vmean:      81.1 cm/s MV VTI:        0.44 m MV PHT:        52.20 msec MV Decel Time: 180 msec MV E velocity: 187.00 cm/s 103 cm/s MV A velocity: 62.20 cm/s  70.3 cm/s MV E/A ratio:  3.01        1.5  Kirk Ruths MD Electronically signed by Kirk Ruths MD Signature Date/Time: 07/28/2019/12:10:49 PM    Final         Scheduled Meds: . amiodarone  200 mg Oral BID  . amLODipine  10 mg Oral Daily  . aspirin EC  81 mg Oral Daily  . atorvastatin  80 mg Oral q1800  . Chlorhexidine Gluconate Cloth  6 each Topical Daily  . clopidogrel  75 mg Oral Daily  . dexamethasone  6 mg Oral Q24H  . insulin aspart  0-15 Units Subcutaneous TID WC  . insulin aspart  0-5 Units Subcutaneous QHS  . insulin detemir  15 Units Subcutaneous QHS  . levothyroxine  75 mcg Oral Q0600  . mouth rinse  15 mL Mouth Rinse BID  . metoprolol tartrate  25 mg Oral BID  . vitamin B-12   1,000 mcg Oral Daily   Continuous Infusions: . [START ON 07/30/2019] remdesivir 100 mg in NS 100 mL       LOS: 2 days    Time spent: 30 minutes with over 50% of the time coordinating the patient's care    Harold Hedge, DO [facility] Pager [phone]  If 7PM-7AM, please contact night-coverage [email or web address] Password [facility] 07/29/2019, 5:13 PM

## 2019-07-29 NOTE — Consult Note (Signed)
[facility] Renal Consultation Note  Requesting MD: Neysa Bonito Indication for Consultation: A on CRF  HPI:  Amber Ballard is a 85 y.o. female with past medical history significant for diabetes mellitus, hypertension, diastolic heart failure.  She is also status post hip surgery 3 weeks ago.  She presented to [facility] on 12/20 with shortness of breath, found to be Covid positive and have moderate bilateral pleural effusions.  She was admitted, started on medical treatment for Covid.  Complications have included rapid A. Fib-transferred to [facility] on 12/22 for management of that, now converted.   Regarding renal function, there is a couple of creatinines in the system from June 2020 that were between 1.5 and 1.6.  On 12/23 creatinine 4.23 and increased to 4.5 today and that is reason for consultation.  Reportedly a contrasted imaging study at [place] was on an ARB prior to admission.  Blood pressure of anything has been high.  She is currently on 2 L nasal cannula-urine output seems adequate with 900 recorded last 24 hours.  Urine on 12/23 showed 100 protein, 20-50 RBCs and greater than 50 WBCs with granular casts.  Renal ultrasound shows a small left kidney with increased echogenicity throughout-no hydro-also of note patient is FOBT positive-Eliquis was stopped-recommending conservative management  Creatinine, Ser  Date/Time Value Ref Range Status  07/29/2019 03:15 AM 4.54 (H) 0.44 - 1.00 mg/dL Final  07/28/2019 02:00 PM 4.24 (H) 0.44 - 1.00 mg/dL Final  07/28/2019 05:00 AM 4.23 (H) 0.44 - 1.00 mg/dL Final  01/22/2019 04:30 AM 1.25 (H) 0.44 - 1.00 mg/dL Final  01/21/2019 04:36 PM 1.57 (H) 0.44 - 1.00 mg/dL Final  01/21/2019 03:56 AM 1.59 (H) 0.44 - 1.00 mg/dL Final  01/20/2019 11:48 PM 1.78 (H) 0.44 - 1.00 mg/dL Final  03/26/2012 07:42 AM 0.76 0.50 - 1.10 mg/dL Final     PMHx:   Past Medical History:  Diagnosis Date  . Anxiety   . Arthritis   .  Bradycardia   . Breast cancer ([place])   . Chronic diastolic (congestive) heart failure ([place])   . Diabetes mellitus   . Heart murmur   . Hyperlipidemia   . Hypertension   . Hypothyroidism   . PAF (paroxysmal atrial fibrillation) ([place])   . PONV (postoperative nausea and vomiting)   . Stroke ([place])   . Wears glasses     Past Surgical History:  Procedure Laterality Date  . ABDOMINAL HYSTERECTOMY    . BREAST SURGERY     lt lumpectomy-snbx  . COLONOSCOPY    . EYE SURGERY     both cataracts  . FRACTURE SURGERY     rt arm,rt fingers  . ORIF ANKLE FRACTURE  03/26/2012   Procedure: OPEN REDUCTION INTERNAL FIXATION (ORIF) ANKLE FRACTURE;  Surgeon: Wylene Simmer, MD;  Location: [place];  Service: Orthopedics;  Laterality: Right;  Open Reduction Internal Fixation Right Ankle Trimalleolar Fracture     Family Hx:  Family History  Problem Relation Age of Onset  . Diabetes Mother     Social History:  reports that she quit smoking about 9 years ago. She has never used smokeless tobacco. She reports that she does not drink alcohol or use drugs.  Allergies: No Known Allergies  Medications: Prior to Admission medications   Medication Sig Start Date End Date Taking? Authorizing Provider  acetaminophen (TYLENOL) 500 MG tablet Take 500 mg by mouth every 6 (six) hours as needed for mild pain.   Yes [provider]  aspirin 81 MG tablet Take 1 tablet (81 mg total) by mouth daily. 09/14/18  Yes Garvin Fila, MD  atorvastatin (LIPITOR) 80 MG tablet Take 80 mg by mouth daily at 6 PM.  08/17/18  Yes [provider]  clopidogrel (PLAVIX) 75 MG tablet Take 75 mg by mouth daily.   Yes [provider]  furosemide (LASIX) 40 MG tablet Take 1 tablet (40 mg total) by mouth daily as needed for fluid or edema (as needed for weight gain 3 lbs in 24 hours or 5 lbs in 7 days.). 01/22/19  Yes Arrien, Jimmy Picket, MD  hydrALAZINE (APRESOLINE) 100 MG tablet Take 100 mg by mouth every 8  (eight) hours.   Yes [provider]  insulin aspart (NOVOLOG) 100 UNIT/ML injection Inject 0-14 Units into the skin 3 (three) times daily with meals. Sliding scale per Dr. Micheal Likens   Yes [provider]  isosorbide mononitrate (IMDUR) 30 MG 24 hr tablet Take 30 mg by mouth daily. 07/19/19  Yes [provider]  levothyroxine (SYNTHROID, LEVOTHROID) 75 MCG tablet Take 75 mcg by mouth daily before breakfast.  08/17/18  Yes [provider]  losartan (COZAAR) 100 MG tablet Take 100 mg by mouth daily.  08/06/18  Yes [provider]  Magnesium 400 MG TABS Take 400 mg by mouth 2 (two) times daily.   Yes [provider]  meclizine (ANTIVERT) 25 MG tablet Take 25 mg by mouth 3 (three) times daily as needed for dizziness.   Yes [provider]  metoprolol tartrate (LOPRESSOR) 50 MG tablet Take 1 tablet (50 mg total) by mouth 2 (two) times daily for 30 days. 01/22/19 07/28/19 Yes Arrien, Jimmy Picket, MD  mirtazapine (REMERON) 15 MG tablet Take 15 mg by mouth at bedtime. 05/10/19  Yes [provider]  potassium chloride (K-DUR) 10 MEQ tablet Take 1 tablet (10 mEq total) by mouth daily as needed (Take only with furosemide.). 01/22/19  Yes Arrien, Jimmy Picket, MD  senna (SENOKOT) 8.6 MG TABS Take 2 tablets (17.2 mg total) by mouth 2 (two) times daily. Patient taking differently: Take 2 tablets by mouth daily as needed.  03/29/12  Yes Wylene Simmer, MD  venlafaxine XR (EFFEXOR-XR) 37.5 MG 24 hr capsule Take 37.5 mg by mouth daily. 07/13/19  Yes [provider]  vitamin B-12 (CYANOCOBALAMIN) 1000 MCG tablet Take 1,000 mcg by mouth daily.   Yes [provider]  Vitamin D, Ergocalciferol, (DRISDOL) 1.25 MG (50000 UT) CAPS capsule Take 50,000 Units by mouth every 7 (seven) days.   Yes [provider]  amLODipine (NORVASC) 10 MG tablet Take 10 mg by mouth daily.  04/14/18   [provider]  Calcium Polycarbophil (FIBER  LAXATIVE PO) Take 1 tablet by mouth daily as needed.     [provider]  insulin detemir (LEVEMIR) 100 UNIT/ML injection Inject 15 Units into the skin at bedtime. Patient taking differently: Inject 10 Units into the skin 2 (two) times daily.  03/30/12   Wylene Simmer, MD  LEVEMIR FLEXTOUCH 100 UNIT/ML Pen Inject 10 Units into the skin 2 (two) times daily. 06/30/19   [provider]    I have reviewed the patient's current medications.  Labs:  Results for orders placed or performed during the hospital encounter of 07/27/19 (from the past 48 hour(s))  Glucose, capillary     Status: Abnormal   Collection Time: 07/27/19  1:01 PM  Result Value Ref Range   Glucose-Capillary 215 (H) 70 - 99  mg/dL  Hemoglobin A1c     Status: Abnormal   Collection Time: 07/27/19  1:46 PM  Result Value Ref Range   Hgb A1c MFr Bld 7.4 (H) 4.8 - 5.6 %    Comment: (NOTE) Pre diabetes:          5.7%-6.4% Diabetes:              >6.4% Glycemic control for   <7.0% adults with diabetes    Mean Plasma Glucose 165.68 mg/dL    Comment: Performed at [facility] [address] [place], [place] [address]  ABO/Rh     Status: None   Collection Time: 07/27/19  2:00 PM  Result Value Ref Range   ABO/RH(D)      B POS Performed at [facility] [address], [place], [place] [address]   D-dimer, quantitative (not at [facility])     Status: Abnormal   Collection Time: 07/27/19  4:45 PM  Result Value Ref Range   D-Dimer, Quant 2.49 (H) 0.00 - 0.50 ug/mL-FEU    Comment: (NOTE) At the manufacturer cut-off of 0.50 ug/mL FEU, this assay has been documented to exclude PE with a sensitivity and negative predictive value of 97 to 99%.  At this time, this assay has not been approved by the FDA to exclude DVT/VTE. Results should be correlated with clinical presentation. Performed at [facility], [place] [address] [place], [place] [address]   Glucose, capillary     Status: Abnormal   Collection  Time: 07/27/19  5:21 PM  Result Value Ref Range   Glucose-Capillary 210 (H) 70 - 99 mg/dL  Glucose, capillary     Status: Abnormal   Collection Time: 07/27/19  9:23 PM  Result Value Ref Range   Glucose-Capillary 144 (H) 70 - 99 mg/dL  D-dimer, quantitative (not at [facility])     Status: Abnormal   Collection Time: 07/28/19  5:00 AM  Result Value Ref Range   D-Dimer, Quant 2.49 (H) 0.00 - 0.50 ug/mL-FEU    Comment: (NOTE) At the manufacturer cut-off of 0.50 ug/mL FEU, this assay has been documented to exclude PE with a sensitivity and negative predictive value of 97 to 99%.  At this time, this assay has not been approved by the FDA to exclude DVT/VTE. Results should be correlated with clinical presentation. Performed at [facility], [place] [address] [place], [place] [address]   CBC with Differential/Platelet     Status: Abnormal   Collection Time: 07/28/19  5:00 AM  Result Value Ref Range   WBC 8.0 4.0 - 10.5 K/uL   RBC 3.05 (L) 3.87 - 5.11 MIL/uL   Hemoglobin 8.9 (L) 12.0 - 15.0 g/dL   HCT 28.1 (L) 36.0 - 46.0 %   MCV 92.1 80.0 - 100.0 fL   MCH 29.2 26.0 - 34.0 pg   MCHC 31.7 30.0 - 36.0 g/dL   RDW 15.0 11.5 - 15.5 %   Platelets 399 150 - 400 K/uL   nRBC 0.5 (H) 0.0 - 0.2 %   Neutrophils Relative % 88 %   Neutro Abs 7.1 1.7 - 7.7 K/uL   Lymphocytes Relative 6 %   Lymphs Abs 0.5 (L) 0.7 - 4.0 K/uL   Monocytes Relative 5 %   Monocytes Absolute 0.4 0.1 - 1.0 K/uL   Eosinophils Relative 0 %   Eosinophils Absolute 0.0 0.0 - 0.5 K/uL   Basophils Relative 0 %   Basophils Absolute 0.0 0.0 - 0.1  K/uL   Immature Granulocytes 1 %   Abs Immature Granulocytes 0.04 0.00 - 0.07 K/uL    Comment: Performed at [facility], [place] [address], [place], [place] [address]  Comprehensive metabolic panel     Status: Abnormal   Collection Time: 07/28/19  5:00 AM  Result Value Ref Range   Sodium 137 135 - 145 mmol/L   Potassium 5.7 (H) 3.5 - 5.1 mmol/L   Chloride 104 98 - 111 mmol/L    CO2 18 (L) 22 - 32 mmol/L   Glucose, Bld 154 (H) 70 - 99 mg/dL   BUN 66 (H) 8 - 23 mg/dL   Creatinine, Ser 4.23 (H) 0.44 - 1.00 mg/dL   Calcium 7.3 (L) 8.9 - 10.3 mg/dL   Total Protein 5.7 (L) 6.5 - 8.1 g/dL   Albumin 2.9 (L) 3.5 - 5.0 g/dL   AST 23 15 - 41 U/L   ALT 15 0 - 44 U/L   Alkaline Phosphatase 83 38 - 126 U/L   Total Bilirubin 0.5 0.3 - 1.2 mg/dL   GFR calc non Af Amer 9 (L) >60 mL/min   GFR calc Af Amer 10 (L) >60 mL/min   Anion gap 15 5 - 15    Comment: Performed at [facility], [place] [address] [place], [place] [address]  C-reactive protein     Status: Abnormal   Collection Time: 07/28/19  5:00 AM  Result Value Ref Range   CRP 1.1 (H) <1.0 mg/dL    Comment: Performed at [facility], [place] [address] [place], [place] [address]  Ferritin     Status: None   Collection Time: 07/28/19  5:00 AM  Result Value Ref Range   Ferritin 88 11 - 307 ng/mL    Comment: Performed at [facility], [place] [address] [place], [place] [address]  Magnesium     Status: None   Collection Time: 07/28/19  5:00 AM  Result Value Ref Range   Magnesium 2.4 1.7 - 2.4 mg/dL    Comment: Performed at [facility], [place] [address], [place], [place] [address]  Phosphorus     Status: Abnormal   Collection Time: 07/28/19  5:00 AM  Result Value Ref Range   Phosphorus 6.0 (H) 2.5 - 4.6 mg/dL    Comment: Performed at [facility] [address] [place], [place] [address]  TSH     Status: None   Collection Time: 07/28/19  5:00 AM  Result Value Ref Range   TSH 1.647 0.350 - 4.500 uIU/mL    Comment: Performed by a 3rd Generation assay with a functional sensitivity of <=0.01 uIU/mL. Performed at [facility], [place] [address], [place],  [address]   Urinalysis, Routine w reflex microscopic     Status: Abnormal   Collection Time: 07/28/19  7:33 AM  Result Value Ref Range   Color, Urine YELLOW YELLOW   APPearance HAZY (A) CLEAR   Specific Gravity, Urine 1.024 1.005  - 1.030   pH 5.0 5.0 - 8.0   Glucose, UA 50 (A) NEGATIVE mg/dL   Hgb urine dipstick LARGE (A) NEGATIVE   Bilirubin Urine NEGATIVE NEGATIVE   Ketones, ur NEGATIVE NEGATIVE mg/dL   Protein, ur 100 (A) NEGATIVE mg/dL   Nitrite NEGATIVE NEGATIVE   Leukocytes,Ua NEGATIVE NEGATIVE   RBC / HPF 21-50 0 - 5 RBC/hpf   WBC, UA >50 (H) 0 - 5 WBC/hpf   Bacteria, UA RARE (  A) NONE SEEN   Squamous Epithelial / LPF 0-5 0 - 5   Mucus PRESENT    Hyaline Casts, UA PRESENT    Granular Casts, UA PRESENT    Non Squamous Epithelial 0-5 (A) NONE SEEN    Comment: Performed at [facility], [place] [address], [place], [place] [address]  Glucose, capillary     Status: Abnormal   Collection Time: 07/28/19  7:40 AM  Result Value Ref Range   Glucose-Capillary 139 (H) 70 - 99 mg/dL  Glucose, capillary     Status: Abnormal   Collection Time: 07/28/19 11:44 AM  Result Value Ref Range   Glucose-Capillary 111 (H) 70 - 99 mg/dL  Basic metabolic panel     Status: Abnormal   Collection Time: 07/28/19  2:00 PM  Result Value Ref Range   Sodium 136 135 - 145 mmol/L   Potassium 5.2 (H) 3.5 - 5.1 mmol/L   Chloride 105 98 - 111 mmol/L   CO2 20 (L) 22 - 32 mmol/L   Glucose, Bld 131 (H) 70 - 99 mg/dL   BUN 68 (H) 8 - 23 mg/dL   Creatinine, Ser 4.24 (H) 0.44 - 1.00 mg/dL   Calcium 7.4 (L) 8.9 - 10.3 mg/dL   GFR calc non Af Amer 9 (L) >60 mL/min   GFR calc Af Amer 10 (L) >60 mL/min   Anion gap 11 5 - 15    Comment: Performed at [facility], [place] [address] [place], [place] [address]  Occult blood card to lab, stool RN will collect     Status: Abnormal   Collection Time: 07/28/19  2:35 PM  Result Value Ref Range   Fecal Occult Bld POSITIVE (A) NEGATIVE    Comment: Performed at [facility], [place] [address], [place], [place] [address]  Glucose, capillary     Status: Abnormal   Collection Time: 07/28/19  4:55 PM  Result Value Ref Range   Glucose-Capillary 132 (H) 70 - 99 mg/dL  Culture, Urine      Status: Abnormal   Collection Time: 07/28/19  5:27 PM   Specimen: Urine, Random  Result Value Ref Range   Specimen Description URINE, RANDOM    Special Requests NONE    Culture (A)     40,000 COLONIES/mL GROUP B STREP(S.AGALACTIAE)ISOLATED TESTING AGAINST S. AGALACTIAE NOT ROUTINELY PERFORMED DUE TO PREDICTABILITY OF AMP/PEN/VAN SUSCEPTIBILITY. Performed at [facility], [place] [address], [place], [place] [address]    Report Status 07/29/2019 FINAL   Glucose, capillary     Status: Abnormal   Collection Time: 07/28/19  8:41 PM  Result Value Ref Range   Glucose-Capillary 166 (H) 70 - 99 mg/dL   Comment 1 Document in Chart   D-dimer, quantitative (not at [facility])     Status: Abnormal   Collection Time: 07/29/19  3:15 AM  Result Value Ref Range   D-Dimer, Quant 1.91 (H) 0.00 - 0.50 ug/mL-FEU    Comment: (NOTE) At the manufacturer cut-off of 0.50 ug/mL FEU, this assay has been documented to exclude PE with a sensitivity and negative predictive value of 97 to 99%.  At this time, this assay has not been approved by the FDA to exclude DVT/VTE. Results should be correlated with clinical presentation. Performed at [facility], [place] [address], [place], [place] [address]   CBC with Differential/Platelet     Status: Abnormal   Collection Time: 07/29/19  3:15 AM  Result Value Ref Range  WBC 7.0 4.0 - 10.5 K/uL   RBC 2.87 (L) 3.87 - 5.11 MIL/uL   Hemoglobin 8.2 (L) 12.0 - 15.0 g/dL   HCT 26.0 (L) 36.0 - 46.0 %   MCV 90.6 80.0 - 100.0 fL   MCH 28.6 26.0 - 34.0 pg   MCHC 31.5 30.0 - 36.0 g/dL   RDW 15.0 11.5 - 15.5 %   Platelets 346 150 - 400 K/uL   nRBC 0.6 (H) 0.0 - 0.2 %   Neutrophils Relative % 87 %   Neutro Abs 6.1 1.7 - 7.7 K/uL   Lymphocytes Relative 5 %   Lymphs Abs 0.4 (L) 0.7 - 4.0 K/uL   Monocytes Relative 7 %   Monocytes Absolute 0.5 0.1 - 1.0 K/uL   Eosinophils Relative 0 %   Eosinophils Absolute 0.0 0.0 - 0.5 K/uL   Basophils Relative 0 %   Basophils Absolute  0.0 0.0 - 0.1 K/uL   Immature Granulocytes 1 %   Abs Immature Granulocytes 0.06 0.00 - 0.07 K/uL    Comment: Performed at [facility] [address], [place], [place] [address]  Comprehensive metabolic panel     Status: Abnormal   Collection Time: 07/29/19  3:15 AM  Result Value Ref Range   Sodium 139 135 - 145 mmol/L   Potassium 4.9 3.5 - 5.1 mmol/L   Chloride 104 98 - 111 mmol/L   CO2 21 (L) 22 - 32 mmol/L   Glucose, Bld 167 (H) 70 - 99 mg/dL   BUN 73 (H) 8 - 23 mg/dL   Creatinine, Ser 4.54 (H) 0.44 - 1.00 mg/dL   Calcium 7.2 (L) 8.9 - 10.3 mg/dL   Total Protein 5.0 (L) 6.5 - 8.1 g/dL   Albumin 2.5 (L) 3.5 - 5.0 g/dL   AST 22 15 - 41 U/L   ALT 18 0 - 44 U/L   Alkaline Phosphatase 81 38 - 126 U/L   Total Bilirubin 0.5 0.3 - 1.2 mg/dL   GFR calc non Af Amer 8 (L) >60 mL/min   GFR calc Af Amer 10 (L) >60 mL/min   Anion gap 14 5 - 15    Comment: Performed at [facility], [place] [address], [place], [place] [address]  C-reactive protein     Status: None   Collection Time: 07/29/19  3:15 AM  Result Value Ref Range   CRP 0.8 <1.0 mg/dL    Comment: Performed at [facility] [address] [place] [address], [place], [place] [address]  Ferritin     Status: None   Collection Time: 07/29/19  3:15 AM  Result Value Ref Range   Ferritin 85 11 - 307 ng/mL    Comment: Performed at [facility], [place] [address], [place], [place] [address]  Magnesium     Status: Abnormal   Collection Time: 07/29/19  3:15 AM  Result Value Ref Range   Magnesium 2.5 (H) 1.7 - 2.4 mg/dL    Comment: Performed at [facility] [address], [place], [place] [address]  Phosphorus     Status: Abnormal   Collection Time: 07/29/19  3:15 AM  Result Value Ref Range   Phosphorus 6.5 (H) 2.5 - 4.6 mg/dL    Comment: Performed at [facility] [address], [place], [place] [address]  Glucose, capillary     Status: Abnormal   Collection Time: 07/29/19  7:56 AM  Result Value Ref Range    Glucose-Capillary 124 (H) 70 -  99 mg/dL  Glucose, capillary     Status: None   Collection Time: 07/29/19 11:49 AM  Result Value Ref Range   Glucose-Capillary 81 70 - 99 mg/dL     ROS:  Review of systems not obtained due to patient factors.  Review of systems not done as patient not personally interviewed  Physical Exam: Vitals:   07/29/19 0800 07/29/19 0904  BP: (!) 182/76   Pulse: 61 65  Resp: 13   Temp: 97.8 F (36.6 C)   SpO2: 95%      Complete physical examination assessment was not done secondary to patient's Covid status.  Exam from hospitalist has been reviewed   Assessment/Plan: 85 year old white female with diabetes, hypertension, diastolic heart failure as well as stage III CKD.  She is currently hospitalized secondary to Covid infection and has a complication of acute on chronic renal failure 1.Renal-Baseline stage III CKD with creatinine in the mid ones.  Last creatinine I have her for review is July 2020.  Here, creatinine was 4.2, increased to 4.5 today.  The good news is that she is nonoliguric.  I suspect her acute kidney injury is hemodynamic with ARB on board and contrast mediated.  Since making urine I hope that she is trying to plateau and we will see kidney function turnaround.  Given her age, her comorbid's and her DNR status not sure she would be a dialysis candidate.  There are no indications for dialysis at present 2. Hypertension/volume  -blood pressure if anything is high.  Is on amlodipine 10 mg daily.  Was on losartan prior to admission which is appropriately held.  Would not give fluids or diuretics at this time, just allow her to equilibrate 3.  Hyperkalemia-has been treated medically with good results.  Was on potassium as an outpatient as well 4. Anemia  -hemoglobin low and FOBT positive.  Holding anticoagulants and supportive care 5.  A. Fib-cardiology following, on Lopressor and converted pretty quickly -remains in sinus 6.  ID-urine looks like a UTI.   Only low count strep was found, no symptoms-  no treatment-White count normal   Louis Meckel 07/29/2019, 12:59 PM

## 2019-07-29 NOTE — Plan of Care (Signed)

## 2019-07-30 DIAGNOSIS — I10 Essential (primary) hypertension: Secondary | ICD-10-CM

## 2019-07-30 LAB — COMPREHENSIVE METABOLIC PANEL
ALT: 20 U/L (ref 0–44)
AST: 26 U/L (ref 15–41)
Albumin: 2.5 g/dL — ABNORMAL LOW (ref 3.5–5.0)
Alkaline Phosphatase: 79 U/L (ref 38–126)
Anion gap: 11 (ref 5–15)
BUN: 70 mg/dL — ABNORMAL HIGH (ref 8–23)
CO2: 22 mmol/L (ref 22–32)
Calcium: 7.2 mg/dL — ABNORMAL LOW (ref 8.9–10.3)
Chloride: 108 mmol/L (ref 98–111)
Creatinine, Ser: 3.9 mg/dL — ABNORMAL HIGH (ref 0.44–1.00)
GFR calc Af Amer: 11 mL/min — ABNORMAL LOW (ref >60)
GFR calc non Af Amer: 10 mL/min — ABNORMAL LOW (ref >60)
Glucose, Bld: 143 mg/dL — ABNORMAL HIGH (ref 70–99)
Potassium: 4.5 mmol/L (ref 3.5–5.1)
Sodium: 141 mmol/L (ref 135–145)
Total Bilirubin: 0.3 mg/dL (ref 0.3–1.2)
Total Protein: 5.1 g/dL — ABNORMAL LOW (ref 6.5–8.1)

## 2019-07-30 LAB — GLUCOSE, CAPILLARY
Glucose-Capillary: 70 mg/dL (ref 70–99)
Glucose-Capillary: 101 mg/dL — ABNORMAL HIGH (ref 70–99)
Glucose-Capillary: 140 mg/dL — ABNORMAL HIGH (ref 70–99)
Glucose-Capillary: 262 mg/dL — ABNORMAL HIGH (ref 70–99)

## 2019-07-30 LAB — CBC WITH DIFFERENTIAL/PLATELET
Abs Immature Granulocytes: 0.13 10*3/uL — ABNORMAL HIGH (ref 0.00–0.07)
Basophils Absolute: 0 10*3/uL (ref 0.0–0.1)
Basophils Relative: 0 %
Eosinophils Absolute: 0 10*3/uL (ref 0.0–0.5)
Eosinophils Relative: 0 %
HCT: 27 % — ABNORMAL LOW (ref 36.0–46.0)
Hemoglobin: 8.5 g/dL — ABNORMAL LOW (ref 12.0–15.0)
Immature Granulocytes: 2 %
Lymphocytes Relative: 5 %
Lymphs Abs: 0.3 10*3/uL — ABNORMAL LOW (ref 0.7–4.0)
MCH: 28.2 pg (ref 26.0–34.0)
MCHC: 31.5 g/dL (ref 30.0–36.0)
MCV: 89.7 fL (ref 80.0–100.0)
Monocytes Absolute: 0.4 10*3/uL (ref 0.1–1.0)
Monocytes Relative: 6 %
Neutro Abs: 6 10*3/uL (ref 1.7–7.7)
Neutrophils Relative %: 87 %
Platelets: 346 10*3/uL (ref 150–400)
RBC: 3.01 MIL/uL — ABNORMAL LOW (ref 3.87–5.11)
RDW: 14.9 % (ref 11.5–15.5)
WBC: 6.9 10*3/uL (ref 4.0–10.5)
nRBC: 0.4 % — ABNORMAL HIGH (ref 0.0–0.2)

## 2019-07-30 LAB — PHOSPHORUS: Phosphorus: 5.6 mg/dL — ABNORMAL HIGH (ref 2.5–4.6)

## 2019-07-30 LAB — FERRITIN: Ferritin: 80 ng/mL (ref 11–307)

## 2019-07-30 LAB — C-REACTIVE PROTEIN: CRP: 0.7 mg/dL (ref <1.0)

## 2019-07-30 LAB — D-DIMER, QUANTITATIVE: D-Dimer, Quant: 2.66 ug/mL-FEU — ABNORMAL HIGH (ref 0.00–0.50)

## 2019-07-30 LAB — MAGNESIUM: Magnesium: 2.3 mg/dL (ref 1.7–2.4)

## 2019-07-30 MED ORDER — AMLODIPINE BESYLATE 10 MG PO TABS
10.0000 mg | ORAL_TABLET | Freq: Every day | ORAL | Status: DC
  Administered 2019-07-31: 09:00:00 10 mg via ORAL
  Filled 2019-07-30: qty 1

## 2019-07-30 MED ORDER — HYDRALAZINE HCL 20 MG/ML IJ SOLN
5.0000 mg | Freq: Three times a day (TID) | INTRAMUSCULAR | Status: DC | PRN
  Administered 2019-07-30 – 2019-07-31 (×2): 5 mg via INTRAVENOUS
  Filled 2019-07-30 (×2): qty 1

## 2019-07-30 MED ORDER — HYDRALAZINE HCL 25 MG PO TABS
25.0000 mg | ORAL_TABLET | Freq: Three times a day (TID) | ORAL | Status: DC
  Administered 2019-07-30 – 2019-07-31 (×4): 25 mg via ORAL
  Filled 2019-07-30 (×4): qty 1

## 2019-07-30 NOTE — Plan of Care (Signed)

## 2019-07-30 NOTE — Progress Notes (Signed)
PROGRESS NOTE    Amber Ballard    Code Status: DNR  ZOX:[id number] DOB: 10-19-1931 DOA: 07/27/2019  PCP: Ocie Doyne., MD    Hospital Summary  This is an 85 year old female with past medical history of diabetes, hypertension, hyperlipidemia, diastolic heart failure, DJD status post right hip surgery 3 weeks ago who presented to [facility] on December 20 with increased shortness of breath, found to have hypoxia with saturation 86% on room air, positive COVID-19 screen found to have moderate bilateral pleural effusions on CT, patchy infiltrates without PE.  Started on remdesivir and Decadron however developed rapid atrial fibrillation.  Started on Cardizem drip with little response then amiodarone drip was added by cardiology at [facility] and Eliquis was also started.  [facility] to [facility] ED she was noted to be on 2 L.  Converted to NSR with heart rate in low 50s and amiodarone and Cardizem stopped.  Cardiology consulted to help manage A. fib with RVR as well as antiplatelets and anticoagulation.  Per cardiology, concern for tachybradycardia syndrome with history of stroke in the past on low-dose aspirin and Plavix.  Given her 85 and history of falls as well as cognitive impairment she was deemed not a good candidate for NOAC and recommended decreasing metoprolol from 50 to 25 mg twice daily as well as amiodarone load 200 mg twice daily and decrease to 200 mg daily after 7 days.  Echo was ordered showed grade 3 diastolic dysfunction as well as EF 60 to 65%.   Of note, FOBT ordered from ED was positive.  This was discussed with GI over the phone who recommended conservative management at this time with outpatient work-up and consider inpatient CT abdomen pelvis with contrast however given her renal function will hold off currently.  Nephrology consulted for AKI on 12/24  A & P   Active Problems:   [id number]   ACS (acute coronary syndrome) ([place])   Pneumonia of both  lungs due to infectious organism   Acute hypoxic respiratory failure secondary to COVID-19  hypertensive, rate controlled, still requiring 2 L nasal cannula CRP 1.1->0.8, D-dimer at 2.5->1.91->2.66 -Continue dexamethasone with last treatment 12/31 -Continue remdesivir with last treatment 12/27 -Trend inflammatory markers -Titrate oxygen as appropriate -Consider VQ scan and lower extremity Doppler if D-dimer continues to increase  Positive FOBT  in setting of Eliquis, Plavix, aspirin.  Hemoglobin 8.9. - discussed with GI over the phone who recommended conservative management at this time with outpatient work-up and consider inpatient CT abdomen pelvis with contrast, however given her renal function will hold off currently -Hold Eliquis per cardiology  A. fib with RVR, currently rate controlled previously on Cardizem and amiodarone drip, converted to NSR and sinus bradycardia.  Drips discontinued in ED.  Echo was ordered showed grade 3 diastolic dysfunction as well as EF 60 to 65%. - Per cardiology, concern for tachybradycardia syndrome with history of stroke in the past on low-dose aspirin and Plavix.  Given her 85 and history of falls as well as cognitive impairment she was deemed not a good candidate for NOAC and recommended decreasing metoprolol from 50 to 25 mg twice daily as well as amiodarone load 200 mg twice daily and decrease to 200 mg daily after 7 days. -Continue telemetry  AKI on CKD 3/4  Improving.  Baseline creatinine 1.6-1.8, creatinine now 4.2-> 4.5->3.9 today, likely from recent dye load at [facility] as well as hemodynamic changes.  Renal ultrasound with increased echogenicity throughout right  kidney, left kidney unremarkable.  Foley removed 12/24 -Nephrology consulted: Hold fluids and diuretics and continue to hold ARB.  Follow-up in a.m.  Foley catheter  placed at [facility] for unknown reason Removed successfully 12/24  Mild hyperkalemia  Resolved with Lasix  Chronic  diastolic heart failure  Stable. grade 3 diastolic dysfunction on echo with EF 60 to 65%. -Daily weight, I/O  Hypertension  SBP>200 this morning just after getting amlodipine -Continue amlodipine -Hydralazine added by nephro -Hold home losartan given AKI  Type 2 diabetes Blood sugar 74 this evening.  Likely from insulin in setting of poor renal function -Change sliding scale to renal sliding scale -Continue long-acting insulin  Asymptomatic GBS UTI  -No role for antibiotics at this time unless she becomes symptomatic -Removed Foley   Hypothyroid -Continue Synthroid   DVT prophylaxis: Aspirin and Plavix only, SCDs, holding systemic anticoagulation with positive FOBT Family Communication: Called patient's daughter, no response  Disposition: Barriers to discharge: renal function.  Will need home health at discharge.  Suspect discharge in 2 to 3 days  Consultants  Cardiology GI Nephrology  Procedures  None  Antibiotics   Anti-infectives (From admission, onward)   Start     Dose/Rate Route Frequency Ordered Stop   07/30/19 1000  remdesivir 100 mg in sodium chloride 0.9 % 100 mL IVPB     100 mg 200 mL/hr over 30 Minutes Intravenous Daily 07/29/19 1255 08/01/19 2359   07/28/19 1000  remdesivir 100 mg in sodium chloride 0.9 % 100 mL IVPB  Status:  Discontinued     100 mg 200 mL/hr over 30 Minutes Intravenous Daily 07/27/19 1316 07/27/19 1345   07/28/19 1000  remdesivir 100 mg in sodium chloride 0.9 % 100 mL IVPB     100 mg 200 mL/hr over 30 Minutes Intravenous Daily 07/27/19 1350 07/29/19 0941   07/27/19 1315  remdesivir 200 mg in sodium chloride 0.9% 250 mL IVPB  Status:  Discontinued     200 mg 580 mL/hr over 30 Minutes Intravenous Once 07/27/19 1316 07/27/19 1345           Subjective   Patient resting comfortably.  Denies any urinary complaints.  Denies any chest pain or shortness of breath.  Tolerating diet  Objective   Vitals:   07/30/19 0920 07/30/19  1032 07/30/19 1200 07/30/19 1404  BP: (!) 207/63 (!) 183/53 (!) 176/51 (!) 151/50  Pulse:      Resp: 15  13   Temp:   98.1 F (36.7 C)   TempSrc:   Oral   SpO2:      Weight:      Height:        Intake/Output Summary (Last 24 hours) at 07/30/2019 1439 Last data filed at 07/30/2019 5852 Gross per 24 hour  Intake 480 ml  Output --  Net 480 ml   Filed Weights   07/27/19 1300 07/29/19 0500 07/30/19 0309  Weight: 63.5 kg 66.6 kg 65.4 kg    Examination:  Physical Exam Vitals and nursing note reviewed.  Constitutional:      Appearance: Normal appearance.  HENT:     Head: Normocephalic and atraumatic.     Nose:     Comments: Nasal cannula Eyes:     Conjunctiva/sclera: Conjunctivae normal.  Cardiovascular:     Rate and Rhythm: Normal rate and regular rhythm.  Pulmonary:     Effort: Pulmonary effort is normal. No respiratory distress.     Breath sounds: Normal breath sounds.  Abdominal:  General: Abdomen is flat.     Palpations: Abdomen is soft.  Musculoskeletal:        General: No swelling. Normal range of motion.     Cervical back: Normal range of motion. No rigidity.  Neurological:     General: No focal deficit present.     Mental Status: She is alert. Mental status is at baseline.  Psychiatric:        Mood and Affect: Mood normal.        Behavior: Behavior normal.     Data Reviewed: I have personally reviewed following labs and imaging studies  CBC: Recent Labs  Lab 07/28/19 0500 07/29/19 0315 07/30/19 0308  WBC 8.0 7.0 6.9  NEUTROABS 7.1 6.1 6.0  HGB 8.9* 8.2* 8.5*  HCT 28.1* 26.0* 27.0*  MCV 92.1 90.6 89.7  PLT 399 346 416   Basic Metabolic Panel: Recent Labs  Lab 07/28/19 0500 07/28/19 1400 07/29/19 0315 07/30/19 0308  NA 137 136 139 141  K 5.7* 5.2* 4.9 4.5  CL 104 105 104 108  CO2 18* 20* 21* 22  GLUCOSE 154* 131* 167* 143*  BUN 66* 68* 73* 70*  CREATININE 4.23* 4.24* 4.54* 3.90*  CALCIUM 7.3* 7.4* 7.2* 7.2*  MG 2.4  --  2.5* 2.3   PHOS 6.0*  --  6.5* 5.6*   GFR: Estimated Creatinine Clearance: 9.4 mL/min (A) (by C-G formula based on SCr of 3.9 mg/dL (H)). Liver Function Tests: Recent Labs  Lab 07/28/19 0500 07/29/19 0315 07/30/19 0308  AST 23 22 26   ALT 15 18 20   ALKPHOS 83 81 79  BILITOT 0.5 0.5 0.3  PROT 5.7* 5.0* 5.1*  ALBUMIN 2.9* 2.5* 2.5*   No results for input(s): LIPASE, AMYLASE in the last 168 hours. No results for input(s): AMMONIA in the last 168 hours. Coagulation Profile: No results for input(s): INR, PROTIME in the last 168 hours. Cardiac Enzymes: No results for input(s): CKTOTAL, CKMB, CKMBINDEX, TROPONINI in the last 168 hours. BNP (last 3 results) No results for input(s): PROBNP in the last 8760 hours. HbA1C: No results for input(s): HGBA1C in the last 72 hours. CBG: Recent Labs  Lab 07/29/19 1149 07/29/19 1701 07/29/19 2129 07/30/19 0755 07/30/19 1147  GLUCAP 81 74 215* 70 101*   Lipid Profile: No results for input(s): CHOL, HDL, LDLCALC, TRIG, CHOLHDL, LDLDIRECT in the last 72 hours. Thyroid Function Tests: Recent Labs    07/28/19 0500  TSH 1.647   Anemia Panel: Recent Labs    07/29/19 0315 07/30/19 0308  FERRITIN 85 80   Sepsis Labs: No results for input(s): PROCALCITON, LATICACIDVEN in the last 168 hours.  Recent Results (from the past 240 hour(s))  Culture, Urine     Status: Abnormal   Collection Time: 07/28/19  5:27 PM   Specimen: Urine, Random  Result Value Ref Range Status   Specimen Description URINE, RANDOM  Final   Special Requests NONE  Final   Culture (A)  Final    40,000 COLONIES/mL GROUP B STREP(S.AGALACTIAE)ISOLATED TESTING AGAINST S. AGALACTIAE NOT ROUTINELY PERFORMED DUE TO PREDICTABILITY OF AMP/PEN/VAN SUSCEPTIBILITY. Performed at [facility], [place] [address], [place], [place] [address]    Report Status 07/29/2019 FINAL  Final         Radiology Studies: US RENAL  Result Date: 07/29/2019 CLINICAL DATA:  Acute kidney injury  EXAM: RENAL / URINARY TRACT ULTRASOUND COMPLETE COMPARISON:  Ultrasound 07/26/2019 FINDINGS: Right Kidney: Renal measurements: 10.7 x 4.6 x 3.4 cm = volume: 89.3  mL. Diffusely increased echogenicity with slight loss of the corticomedullary differentiation. No visible renal calculus or hydronephrosis. No worrisome right renal lesion. Left Kidney: Renal measurements: 7.6 x 3.8 x 3.0 cm = volume: 44.7 mL. Echogenicity is within normal limits. No shadowing calculus, hydronephrosis or concerning renal mass. Bladder: Bladder is decompressed by inflated Foley catheter and therefore poorly assessed at the time of imaging. Other: None. IMPRESSION: Increased right renal cortical echogenicity and loss of corticomedullary differentiation compatible with medical renal disease. Left kidney is a more normal appearance. No other concerning renal abnormality. Bladder decompressed by inflated Foley catheter. Electronically Signed   By: Lovena Le M.D.   On: 07/29/2019 03:52        Scheduled Meds: . amiodarone  200 mg Oral BID  . amLODipine  10 mg Oral Daily  . aspirin EC  81 mg Oral Daily  . atorvastatin  80 mg Oral q1800  . Chlorhexidine Gluconate Cloth  6 each Topical Daily  . clopidogrel  75 mg Oral Daily  . dexamethasone  6 mg Oral Q24H  . hydrALAZINE  25 mg Oral Q8H  . insulin aspart  0-5 Units Subcutaneous QHS  . insulin aspart  0-9 Units Subcutaneous TID WC  . insulin detemir  15 Units Subcutaneous QHS  . levothyroxine  75 mcg Oral Q0600  . mouth rinse  15 mL Mouth Rinse BID  . metoprolol tartrate  25 mg Oral BID  . vitamin B-12  1,000 mcg Oral Daily   Continuous Infusions: . remdesivir 100 mg in NS 100 mL 100 mg (07/30/19 0923)     LOS: 3 days    Time spent: 78minutes with over 50% of the time coordinating the patient's care    Harold Hedge, DO [facility] Pager [phone]  If 7PM-7AM, please contact night-coverage [email or web address] Password [facility] 07/30/2019, 2:39 PM

## 2019-07-30 NOTE — Progress Notes (Signed)
Subjective:  No UOP recorded- I think b/c foley taken out which is fine-   crt improved from 4.5 to 3.9 Objective Vital signs in last 24 hours: Vitals:   07/30/19 0000 07/30/19 0309 07/30/19 0332 07/30/19 0917  BP: (!) 165/56  (!) 181/58 (!) 191/58  Pulse: 64  62 76  Resp: 15  14   Temp: 98.8 F (37.1 C)  98.8 F (37.1 C)   TempSrc: Oral  Axillary   SpO2: 97%  98%   Weight:  65.4 kg    Height:       Weight change: -1.2 kg  Intake/Output Summary (Last 24 hours) at 07/30/2019 1008 Last data filed at 07/29/2019 1830 Gross per 24 hour  Intake 720 ml  Output --  Net 720 ml    Assessment/Plan: 85 year old white female with diabetes, hypertension, diastolic heart failure as well as stage III CKD.  She is currently hospitalized secondary to Covid infection and has a complication of acute on chronic renal failure 1.Renal-Baseline stage III CKD with creatinine in the mid ones.  Last creatinine I have her for review is July 2020.  Here, creatinine was 4.2, increased to 4.5 but down to 3.9 today.  The good news is that she appears to be nonoliguric.  I suspect her acute kidney injury is hemodynamic with ARB on board and contrast mediated.   Given her age, her comorbid's and her DNR status not sure she would be a dialysis candidate.  There are no indications for dialysis at present.  Fortunately her renal parameters have improved 2. Hypertension/volume  -blood pressure if anything is high.  Is on amlodipine 10 mg daily.  Was on losartan prior to admission which is appropriately held.  Would not give fluids or diuretics at this time, just allow her to equilibrate. I will add on some scheduled hydralazine 3.  Hyperkalemia-has been treated medically with good results.  Was on potassium as an outpatient as well 4. Anemia  -hemoglobin low and FOBT positive.  Holding anticoagulants and supportive care 5.  A. Fib-cardiology following, on Lopressor and converted pretty quickly -remains in sinus 6.   ID-urine looks like a UTI.  Only low count strep was found, no symptoms-  no treatment-white count normal   Louis Meckel    Labs: Basic Metabolic Panel: Recent Labs  Lab 07/28/19 0500 07/28/19 1400 07/29/19 0315 07/30/19 0308  NA 137 136 139 141  K 5.7* 5.2* 4.9 4.5  CL 104 105 104 108  CO2 18* 20* 21* 22  GLUCOSE 154* 131* 167* 143*  BUN 66* 68* 73* 70*  CREATININE 4.23* 4.24* 4.54* 3.90*  CALCIUM 7.3* 7.4* 7.2* 7.2*  PHOS 6.0*  --  6.5* 5.6*   Liver Function Tests: Recent Labs  Lab 07/28/19 0500 07/29/19 0315 07/30/19 0308  AST 23 22 26   ALT 15 18 20   ALKPHOS 83 81 79  BILITOT 0.5 0.5 0.3  PROT 5.7* 5.0* 5.1*  ALBUMIN 2.9* 2.5* 2.5*   No results for input(s): LIPASE, AMYLASE in the last 168 hours. No results for input(s): AMMONIA in the last 168 hours. CBC: Recent Labs  Lab 07/28/19 0500 07/29/19 0315 07/30/19 0308  WBC 8.0 7.0 6.9  NEUTROABS 7.1 6.1 6.0  HGB 8.9* 8.2* 8.5*  HCT 28.1* 26.0* 27.0*  MCV 92.1 90.6 89.7  PLT 399 346 346   Cardiac Enzymes: No results for input(s): CKTOTAL, CKMB, CKMBINDEX, TROPONINI in the last 168 hours. CBG: Recent Labs  Lab 07/29/19 0756 07/29/19 1149  07/29/19 1701 07/29/19 2129 07/30/19 0755  GLUCAP 124* 81 74 215* 70    Iron Studies:  Recent Labs    07/30/19 0308  FERRITIN 80   Studies/Results: US RENAL  Result Date: 07/29/2019 CLINICAL DATA:  Acute kidney injury EXAM: RENAL / URINARY TRACT ULTRASOUND COMPLETE COMPARISON:  Ultrasound 07/26/2019 FINDINGS: Right Kidney: Renal measurements: 10.7 x 4.6 x 3.4 cm = volume: 89.3 mL. Diffusely increased echogenicity with slight loss of the corticomedullary differentiation. No visible renal calculus or hydronephrosis. No worrisome right renal lesion. Left Kidney: Renal measurements: 7.6 x 3.8 x 3.0 cm = volume: 44.7 mL. Echogenicity is within normal limits. No shadowing calculus, hydronephrosis or concerning renal mass. Bladder: Bladder is decompressed by  inflated Foley catheter and therefore poorly assessed at the time of imaging. Other: None. IMPRESSION: Increased right renal cortical echogenicity and loss of corticomedullary differentiation compatible with medical renal disease. Left kidney is a more normal appearance. No other concerning renal abnormality. Bladder decompressed by inflated Foley catheter. Electronically Signed   By: Lovena Le M.D.   On: 07/29/2019 03:52   ECHOCARDIOGRAM COMPLETE  Result Date: 07/28/2019   ECHOCARDIOGRAM REPORT   Patient Name:   SADE HOLLON Date of Exam: 07/28/2019 Medical Rec #:  [id number]    Height:       62.0 in Accession #:    [id number]   Weight:       140.0 lb Date of Birth:  11-06-1931    BSA:          1.64 m Patient Age:    59 years     BP:           186/63 mmHg Patient Gender: F            HR:           66 bpm. Exam Location:  Inpatient Procedure: 2D Echo, Cardiac Doppler and Color Doppler Indications:    Atrial fibrillation  History:        Patient has no prior history of Echocardiogram examinations.                 CHF, Stroke, Arrythmias:Atrial Fibrillation; Risk                 Factors:Hypertension and Diabetes. Breast cancer.  Sonographer:    Dustin Flock Referring Phys: [place]  1. Left ventricular ejection fraction, by visual estimation, is 60 to 65%. The left ventricle has normal function. There is no left ventricular hypertrophy.  2. Left ventricular diastolic parameters are consistent with Grade III diastolic dysfunction (restrictive).  3. Global right ventricle has normal systolic function.The right ventricular size is normal.  4. Left atrial size was normal.  5. Right atrial size was normal.  6. Moderate pleural effusion in the left lateral region.  7. Moderate mitral annular calcification.  8. The mitral valve is normal in structure. Mild mitral valve regurgitation. No evidence of mitral stenosis.  9. The tricuspid valve is normal in structure. 10. The aortic valve is  tricuspid. Aortic valve regurgitation is not visualized. Mild aortic valve sclerosis without stenosis. 11. The pulmonic valve was normal in structure. Pulmonic valve regurgitation is not visualized. 12. The inferior vena cava is dilated in size with >50% respiratory variability, suggesting right atrial pressure of 8 mmHg. 13. Normal LV function; restrictive filling; mild MR; left pleural effusion. 14. The left ventricle has no regional wall motion abnormalities. FINDINGS  Left Ventricle: Left ventricular ejection fraction, by visual  estimation, is 60 to 65%. The left ventricle has normal function. The left ventricle has no regional wall motion abnormalities. There is no left ventricular hypertrophy. Left ventricular diastolic parameters are consistent with Grade III diastolic dysfunction (restrictive). Normal left atrial pressure. Right Ventricle: The right ventricular size is normal.Global RV systolic function is has normal systolic function. Left Atrium: Left atrial size was normal in size. Right Atrium: Right atrial size was normal in size Pericardium: There is no evidence of pericardial effusion. There is a moderate pleural effusion in the left lateral region. Mitral Valve: The mitral valve is normal in structure. Moderate mitral annular calcification. Mild mitral valve regurgitation. No evidence of mitral valve stenosis by observation. MV peak gradient, 15.7 mmHg. Tricuspid Valve: The tricuspid valve is normal in structure. Tricuspid valve regurgitation is trivial. Aortic Valve: The aortic valve is tricuspid. Aortic valve regurgitation is not visualized. Mild aortic valve sclerosis is present, with no evidence of aortic valve stenosis. Pulmonic Valve: The pulmonic valve was normal in structure. Pulmonic valve regurgitation is not visualized. Pulmonic regurgitation is not visualized. Aorta: The aortic root is normal in size and structure. Venous: The inferior vena cava is dilated in size with greater than 50%  respiratory variability, suggesting right atrial pressure of 8 mmHg.  Additional Comments: Normal LV function; restrictive filling; mild MR; left pleural effusion.  LEFT VENTRICLE PLAX 2D LVIDd:         4.70 cm Diastology LVIDs:         2.90 cm LV e' lateral:   7.94 cm/s LV PW:         1.00 cm LV E/e' lateral: 23.6 LV IVS:        1.10 cm LV e' medial:    7.94 cm/s LV SV:         70 ml   LV E/e' medial:  23.6 LV SV Index:   41.74  RIGHT VENTRICLE RV S prime:     11.10 cm/s LEFT ATRIUM         Index LA diam:    4.20 cm 2.56 cm/m   AORTA Ao Root diam: 2.50 cm MITRAL VALVE MV Area (PHT): 4.21 cm MV Peak grad:  15.7 mmHg MV Mean grad:  4.0 mmHg MV Vmax:       1.98 m/s MV Vmean:      81.1 cm/s MV VTI:        0.44 m MV PHT:        52.20 msec MV Decel Time: 180 msec MV E velocity: 187.00 cm/s 103 cm/s MV A velocity: 62.20 cm/s  70.3 cm/s MV E/A ratio:  3.01        1.5  Kirk Ruths MD Electronically signed by Kirk Ruths MD Signature Date/Time: 07/28/2019/12:10:49 PM    Final    Medications: Infusions: . remdesivir 100 mg in NS 100 mL 100 mg (07/30/19 0923)    Scheduled Medications: . amiodarone  200 mg Oral BID  . amLODipine  10 mg Oral Daily  . aspirin EC  81 mg Oral Daily  . atorvastatin  80 mg Oral q1800  . Chlorhexidine Gluconate Cloth  6 each Topical Daily  . clopidogrel  75 mg Oral Daily  . dexamethasone  6 mg Oral Q24H  . insulin aspart  0-5 Units Subcutaneous QHS  . insulin aspart  0-9 Units Subcutaneous TID WC  . insulin detemir  15 Units Subcutaneous QHS  . levothyroxine  75 mcg Oral Q0600  . mouth rinse  15 mL  Mouth Rinse BID  . metoprolol tartrate  25 mg Oral BID  . vitamin B-12  1,000 mcg Oral Daily    have reviewed scheduled and prn medications.  Physical Exam: Full physical exam not done due to COVID isolation.  This is in order to preserve PPE and to limit exposure to myself and other providers and patients  07/30/2019,10:08 AM  LOS: 3 days

## 2019-07-31 DIAGNOSIS — I4891 Unspecified atrial fibrillation: Secondary | ICD-10-CM

## 2019-07-31 LAB — COMPREHENSIVE METABOLIC PANEL
ALT: 30 U/L (ref 0–44)
AST: 33 U/L (ref 15–41)
Albumin: 2.6 g/dL — ABNORMAL LOW (ref 3.5–5.0)
Alkaline Phosphatase: 84 U/L (ref 38–126)
Anion gap: 9 (ref 5–15)
BUN: 63 mg/dL — ABNORMAL HIGH (ref 8–23)
CO2: 24 mmol/L (ref 22–32)
Calcium: 7.1 mg/dL — ABNORMAL LOW (ref 8.9–10.3)
Chloride: 109 mmol/L (ref 98–111)
Creatinine, Ser: 2.89 mg/dL — ABNORMAL HIGH (ref 0.44–1.00)
GFR calc Af Amer: 16 mL/min — ABNORMAL LOW (ref >60)
GFR calc non Af Amer: 14 mL/min — ABNORMAL LOW (ref >60)
Glucose, Bld: 213 mg/dL — ABNORMAL HIGH (ref 70–99)
Potassium: 4.1 mmol/L (ref 3.5–5.1)
Sodium: 142 mmol/L (ref 135–145)
Total Bilirubin: 0.3 mg/dL (ref 0.3–1.2)
Total Protein: 5.4 g/dL — ABNORMAL LOW (ref 6.5–8.1)

## 2019-07-31 LAB — CBC WITH DIFFERENTIAL/PLATELET
Abs Immature Granulocytes: 0.07 10*3/uL (ref 0.00–0.07)
Basophils Absolute: 0 10*3/uL (ref 0.0–0.1)
Basophils Relative: 0 %
Eosinophils Absolute: 0 10*3/uL (ref 0.0–0.5)
Eosinophils Relative: 0 %
HCT: 31.4 % — ABNORMAL LOW (ref 36.0–46.0)
Hemoglobin: 9.9 g/dL — ABNORMAL LOW (ref 12.0–15.0)
Immature Granulocytes: 1 %
Lymphocytes Relative: 6 %
Lymphs Abs: 0.5 10*3/uL — ABNORMAL LOW (ref 0.7–4.0)
MCH: 28.8 pg (ref 26.0–34.0)
MCHC: 31.5 g/dL (ref 30.0–36.0)
MCV: 91.3 fL (ref 80.0–100.0)
Monocytes Absolute: 0.7 10*3/uL (ref 0.1–1.0)
Monocytes Relative: 10 %
Neutro Abs: 6.2 10*3/uL (ref 1.7–7.7)
Neutrophils Relative %: 83 %
Platelets: 369 10*3/uL (ref 150–400)
RBC: 3.44 MIL/uL — ABNORMAL LOW (ref 3.87–5.11)
RDW: 14.8 % (ref 11.5–15.5)
WBC: 7.4 10*3/uL (ref 4.0–10.5)
nRBC: 0 % (ref 0.0–0.2)

## 2019-07-31 LAB — MAGNESIUM: Magnesium: 2.3 mg/dL (ref 1.7–2.4)

## 2019-07-31 LAB — GLUCOSE, CAPILLARY
Glucose-Capillary: 168 mg/dL — ABNORMAL HIGH (ref 70–99)
Glucose-Capillary: 151 mg/dL — ABNORMAL HIGH (ref 70–99)
Glucose-Capillary: 230 mg/dL — ABNORMAL HIGH (ref 70–99)

## 2019-07-31 LAB — D-DIMER, QUANTITATIVE: D-Dimer, Quant: 3.94 ug/mL-FEU — ABNORMAL HIGH (ref 0.00–0.50)

## 2019-07-31 LAB — C-REACTIVE PROTEIN: CRP: 0.7 mg/dL (ref <1.0)

## 2019-07-31 LAB — PHOSPHORUS: Phosphorus: 4.6 mg/dL (ref 2.5–4.6)

## 2019-07-31 LAB — FERRITIN: Ferritin: 86 ng/mL (ref 11–307)

## 2019-07-31 MED ORDER — HYDRALAZINE HCL 100 MG PO TABS: 100.0000 mg | ORAL_TABLET | Freq: Three times a day (TID) | ORAL | 0 refills | Status: AC

## 2019-07-31 MED ORDER — METOPROLOL TARTRATE 25 MG PO TABS: 25.0000 mg | ORAL_TABLET | Freq: Two times a day (BID) | ORAL | 0 refills | Status: DC

## 2019-07-31 MED ORDER — FUROSEMIDE 40 MG PO TABS: 40.0000 mg | ORAL_TABLET | Freq: Every day | ORAL | 0 refills | Status: AC | PRN

## 2019-07-31 MED ORDER — AMIODARONE HCL 200 MG PO TABS: ORAL_TABLET | ORAL | 0 refills | Status: AC

## 2019-07-31 NOTE — Progress Notes (Signed)
Attempted to call contact [name], daughter, on both numbers to notify of d/c. Not able to leave voicemail. Will attempt again later.

## 2019-07-31 NOTE — Progress Notes (Signed)
Physical Therapy Treatment Patient Details Name: Amber Ballard MRN: [id number] DOB: 1932/05/31 Today's Date: 07/31/2019    History of Present Illness Pt is an 85 y.o. female admitted 07/27/19 for evaluation of afib; pt with hypoxia and (+) COVID-19. PMH includes recent fall s/p R hip ORIF (on Thanksgiving per chart), CKD III, HTN, DM2, afib, CHF, breast CA, CVA (08/2017).   PT Comments    Pt progressing well with mobility. Able to perform transfers, ambulation and ADL tasks with RW and intermittent min guard for balance. SpO2 93-95% on RA. Pt hopeful for d/c home today. Owns necessary DME and will have assist from daughter. Recommend continued HHPT services to maximize functional mobility and independence.    Follow Up Recommendations  Home health PT;Supervision/Assistance - 24 hour     Equipment Recommendations  None recommended by PT    Recommendations for Other Services       Precautions / Restrictions Precautions Precautions: Fall Restrictions Other Position/Activity Restrictions: Pt reports WBAT since R hip sx 06/2019    Mobility  Bed Mobility Overal bed mobility: Modified Independent Bed Mobility: Supine to Sit     Supine to sit: Modified independent (Device/Increase time);HOB elevated        Transfers Overall transfer level: Needs assistance Equipment used: Rolling walker (2 wheeled) Transfers: Sit to/from Stand Sit to Stand: Supervision            Ambulation/Gait Ambulation/Gait assistance: Supervision;Min guard Gait Distance (Feet): 40 Feet Assistive device: Rolling walker (2 wheeled) Gait Pattern/deviations: Step-through pattern;Decreased stride length Gait velocity: Decreased Gait velocity interpretation: 1.31 - 2.62 ft/sec, indicative of limited community ambulator General Gait Details: Slow, steady gait with RW in room, intermittent min guard for balance; prolonged standing at sink to perform ADL tasks, no physical assist required. SpO2 93-95% on  RA, HR 70-80s   Stairs             Wheelchair Mobility    Modified Rankin (Stroke Patients Only)       Balance Overall balance assessment: Needs assistance Sitting-balance support: No upper extremity supported;Feet supported Sitting balance-Leahy Scale: Fair     Standing balance support: Bilateral upper extremity supported;During functional activity;No upper extremity supported Standing balance-Leahy Scale: Fair Standing balance comment: Can static stand without UE support to perform ADL tasks at sink, dynamic stability improved with UE support                            Cognition Arousal/Alertness: Awake/alert Behavior During Therapy: WFL for tasks assessed/performed Overall Cognitive Status: No family/caregiver present to determine baseline cognitive functioning Area of Impairment: Attention;Memory;Awareness;Problem solving                   Current Attention Level: Sustained;Selective Memory: Decreased short-term memory     Awareness: Emergent Problem Solving: Requires verbal cues General Comments: Likely baseline cognition exacerbated by [facility]      Exercises      General Comments        Pertinent Vitals/Pain Pain Assessment: No/denies pain    Home Living                      Prior Function            PT Goals (current goals can now be found in the care plan section) Progress towards PT goals: Progressing toward goals    Frequency    Min 3X/week      PT Plan  Current plan remains appropriate    Co-evaluation              AM-PAC PT "6 Clicks" Mobility   Outcome Measure  Help needed turning from your back to your side while in a flat bed without using bedrails?: None Help needed moving from lying on your back to sitting on the side of a flat bed without using bedrails?: None Help needed moving to and from a bed to a chair (including a wheelchair)?: A Little Help needed standing up from a chair using your  arms (e.g., wheelchair or bedside chair)?: A Little Help needed to walk in hospital room?: A Little Help needed climbing 3-5 steps with a railing? : A Lot 6 Click Score: 19    End of Session Equipment Utilized During Treatment: Gait belt Activity Tolerance: Patient tolerated treatment well Patient left: in chair;with call bell/phone within reach;with chair alarm set Nurse Communication: Mobility status PT Visit Diagnosis: Unsteadiness on feet (R26.81);Muscle weakness (generalized) (M62.81)     Time: 4166-0630 PT Time Calculation (min) (ACUTE ONLY): 23 min  Charges:  $Therapeutic Exercise: 8-22 mins $Therapeutic Activity: 8-22 mins                     Mabeline Caras, PT, DPT Acute Rehabilitation Services  Pager [phone] Office [phone]  Amber Ballard 07/31/2019, 3:25 PM

## 2019-07-31 NOTE — Progress Notes (Signed)
Subjective:  At least 800 of UOP - crt down again to 2.89 Objective Vital signs in last 24 hours: Vitals:   07/31/19 0615 07/31/19 0630 07/31/19 0828 07/31/19 1003  BP:   (!) 193/58 (!) 186/56  Pulse: (!) 55 (!) 54 63   Resp: 12 13 15    Temp:   98.5 F (36.9 C)   TempSrc:   Axillary   SpO2: 94% 94% 98%   Weight:      Height:       Weight change: -0.4 kg  Intake/Output Summary (Last 24 hours) at 07/31/2019 1122 Last data filed at 07/31/2019 0500 Gross per 24 hour  Intake 340.06 ml  Output 800 ml  Net -459.94 ml    Assessment/Plan: 85 year old white female with diabetes, hypertension, diastolic heart failure as well as stage III CKD.  She is currently hospitalized secondary to Covid infection and has a complication of acute on chronic renal failure 1.Renal- Baseline stage III CKD with creatinine in the mid ones.  Last creatinine I have her for review is July 2020.  Here, creatinine was 4.2, increased to 4.5 but down to 2.89 today.   she appears to be nonoliguric.  I suspect her acute kidney injury is hemodynamic with ARB on board and contrast mediated.  Seems to be trending in the right direction-  I would not resume the ARB due to risk of repeated AKI  2. Hypertension/volume  -blood pressure if anything is high.  Is on amlodipine 10 mg daily. I have added on some scheduled hydralazine- feel free to titrate 3.  Hyperkalemia-has been treated medically with good results.  Was on potassium as an outpatient as well 4. Anemia  -hemoglobin low and FOBT positive.  Holding anticoagulants and supportive care 5.  A. Fib-cardiology following, on Lopressor and converted pretty quickly -remains in sinus 6.  ID-urine looks like a UTI.  Only low count strep was found, no symptoms-  no treatment-white count normal  Renal function is trending better and out of danger range .Marland Kitchen... renal will sign off, call with questions   Louis Meckel    Labs: Basic Metabolic Panel: Recent Labs  Lab  07/29/19 0315 07/30/19 0308 07/31/19 0500  NA 139 141 142  K 4.9 4.5 4.1  CL 104 108 109  CO2 21* 22 24  GLUCOSE 167* 143* 213*  BUN 73* 70* 63*  CREATININE 4.54* 3.90* 2.89*  CALCIUM 7.2* 7.2* 7.1*  PHOS 6.5* 5.6* 4.6   Liver Function Tests: Recent Labs  Lab 07/29/19 0315 07/30/19 0308 07/31/19 0500  AST 22 26 33  ALT 18 20 30   ALKPHOS 81 79 84  BILITOT 0.5 0.3 0.3  PROT 5.0* 5.1* 5.4*  ALBUMIN 2.5* 2.5* 2.6*   No results for input(s): LIPASE, AMYLASE in the last 168 hours. No results for input(s): AMMONIA in the last 168 hours. CBC: Recent Labs  Lab 07/28/19 0500 07/29/19 0315 07/30/19 0308 07/31/19 0500  WBC 8.0 7.0 6.9 7.4  NEUTROABS 7.1 6.1 6.0 6.2  HGB 8.9* 8.2* 8.5* 9.9*  HCT 28.1* 26.0* 27.0* 31.4*  MCV 92.1 90.6 89.7 91.3  PLT 399 346 346 369   Cardiac Enzymes: No results for input(s): CKTOTAL, CKMB, CKMBINDEX, TROPONINI in the last 168 hours. CBG: Recent Labs  Lab 07/30/19 0755 07/30/19 1147 07/30/19 1654 07/30/19 2209 07/31/19 0825  GLUCAP 70 101* 140* 262* 168*    Iron Studies:  Recent Labs    07/30/19 0308  FERRITIN 80   Studies/Results: No results  found. Medications: Infusions: . remdesivir 100 mg in NS 100 mL 100 mg (07/31/19 0841)    Scheduled Medications: . amiodarone  200 mg Oral BID  . amLODipine  10 mg Oral Daily  . aspirin EC  81 mg Oral Daily  . atorvastatin  80 mg Oral q1800  . Chlorhexidine Gluconate Cloth  6 each Topical Daily  . clopidogrel  75 mg Oral Daily  . dexamethasone  6 mg Oral Q24H  . hydrALAZINE  25 mg Oral Q8H  . insulin aspart  0-5 Units Subcutaneous QHS  . insulin aspart  0-9 Units Subcutaneous TID WC  . insulin detemir  15 Units Subcutaneous QHS  . levothyroxine  75 mcg Oral Q0600  . mouth rinse  15 mL Mouth Rinse BID  . metoprolol tartrate  25 mg Oral BID  . vitamin B-12  1,000 mcg Oral Daily    have reviewed scheduled and prn medications.  Physical Exam: Full physical exam not done due  to COVID isolation.  This is in order to preserve PPE and to limit exposure to myself and other providers and patients  07/31/2019,11:22 AM  LOS: 4 days

## 2019-07-31 NOTE — TOC Progression Note (Signed)
Transition of Care [facility]) - Progression Note    Patient Details  Name: Amber Ballard MRN: [id number] Date of Birth: Mar 31, 1932  Transition of Care [facility]) CM/SW [place], [place] Phone Number: [phone] 07/31/2019, 1:28 PM  Clinical Narrative:     CSW spoke with Abigail Butts at [facility] they confirm they are able to resume home health PT and OT services with patient. CSW to fax home health orders, face to face, and dc summary to [phone].   Expected Discharge Plan: [place]    Expected Discharge Plan and Services Expected Discharge Plan: [place]       Living arrangements for the past 2 months: Single Family Home                                       Social Determinants of Health (SDOH) Interventions    Readmission Risk Interventions No flowsheet data found.

## 2019-07-31 NOTE — TOC Progression Note (Addendum)
Transition of Care [facility]) - Progression Note    Patient Details  Name: Amber Ballard MRN: [id number] Date of Birth: June 03, 1932  Transition of Care [facility]) CM/SW [place], [place] Phone Number: [phone] 07/31/2019, 4:28 PM  Clinical Narrative:     Update: After Reyne Dumas check, [name] called CSW back and informed she would not be able to pick patient up until tomorrow. CSW informed her patient is medically ready to dc today and inquired if [name] was currently at the home, she reports she is. CSW informed her PTAR can be scheduled to transport patient home today, [name] is in agreement with this. PTAR has been called and forms faxed to 5W.   CSW informed by RN difficulty reaching patient's daughter [name] to inform of patient's readiness to dc. If needed, CSW can arranged PTAR however will need to confirm family home to let patient into house.   CSW has attempted to call [name] x3 times and notes RN's multiple efforts as well. No answer and no call back, vm is full, CSW has texted [place] as well with no answer.   CSW has reached out to [employer], they will do a wellfare check on home address and have [name] call CSW if she is in the home.   Expected Discharge Plan: [place]    Expected Discharge Plan and Services Expected Discharge Plan: [place] arrangements for the past 2 months: Single Family Home Expected Discharge Date: 07/31/19                                     Social Determinants of Health (SDOH) Interventions    Readmission Risk Interventions No flowsheet data found.

## 2019-07-31 NOTE — Discharge Summary (Signed)
Physician Discharge Summary  Amber Ballard BMW:[id number] DOB: 1932/01/12 DOA: 07/27/2019  PCP: Ocie Doyne., MD  Admit date: 07/27/2019 Discharge date: 07/31/2019   Code Status: DNR  Admitted From: Home Discharged to: [place]: PT/OT/RN Equipment/Devices: None Discharge Condition: Stable  Recommendations for Outpatient Follow-up   1. Follow-up blood pressure, hypertensive during admission, multiple antihypertensive changes 2. metoprolol decreased to 25 mg twice daily, started on amiodarone, hydralazine 50 mg 3 times daily, amlodipine 10 mg, losartan held on discharge due to AKI 3. Losartan and Lasix on hold due to AKI, follow-up labs 4. D-dimer increased in setting of Covid however asymptomatic and no prophylactic anticoagulation as she did have positive FOBT 5. Once renal function is improved consider CT abdomen pelvis with contrast 6. Recommend follow-up with cardiology outpatient, this was not set up as it was a holiday weekend  Hospital Summary  This is an 85 year old female with past medical history of diabetes, hypertension, hyperlipidemia, diastolic heart failure, DJD status post right hip surgery 3 weeks ago who presented to [facility] on December 20 with increased shortness of breath, found to have hypoxia with saturation 86% on room air, positive COVID-19 screen found to have moderate bilateral pleural effusions on CT, patchy infiltrates without PE.  Started on remdesivir and Decadron however developed rapid atrial fibrillation.  Started on Cardizem drip with little response then amiodarone drip was added by cardiology at [facility] and Eliquis was also started.  [facility] ED she was noted to be on 2 L.  Converted to NSR with heart rate in low 50s and amiodarone and Cardizem stopped.  Cardiology consulted to help manage A. fib with RVR as well as antiplatelets and anticoagulation.  Per cardiology, concern for tachybradycardia syndrome  with history of stroke in the past on low-dose aspirin and Plavix.  Given her age and history of falls as well as cognitive impairment she was deemed not a good candidate for NOAC and recommended decreasing metoprolol from 50 to 25 mg twice daily as well as amiodarone load 200 mg twice daily and decrease to 200 mg daily after 7 days.  Echo was ordered showed grade 3 diastolic dysfunction as well as EF 60 to 65%.   Of note, FOBT ordered from ED was positive.  This was discussed with GI over the phone who recommended conservative management at this time with outpatient work-up and consider inpatient CT abdomen pelvis with contrast however given her renal function will hold off currently.  Nephrology consulted for AKI on 12/24..  Creatinine peaked at 4.5, thought to be due to contrast at [facility] of as well as hemodynamic changes.  Losartan was held and no interventions performed.  Creatinine down trended to 2.89 and patient was discharged in stable condition with plan for repeat BMP and close PCP follow-up.  Losartan was held at discharge  10/26 patient noted to be on room air both at rest and ambulation and did not require oxygen at discharge.  She was discharged without steroids or remdesivir as she was off oxygen and stable.  Plan to follow-up with D-dimer outpatient as this was slightly increased in setting of Covid without any signs/symptoms of VTE  A & P   Active Problems:   COVID-19   ACS (acute coronary syndrome) (HCC)   Pneumonia of both lungs due to infectious organism   Acute hypoxic respiratory failure secondary to COVID-19  hypertensive, rate controlled, room air at rest and ambulation D-dimer at 2.5->1.91->2.66-> 3.94.  No sign of VTE - no prophylactic anticoagulation in setting of positive FOBT -Completed 4 days dexamethasone and remdesivir  with last treatment 12/26 as she was discharged on room air -Follow-up D-dimer -Consider VQ scan and lower extremity Doppler if D-dimer  continues to increase  Positive FOBT  in setting of Eliquis, Plavix, aspirin.  Hemoglobin 8.9. - discussed with GI over the phone who recommended conservative management at this time with outpatient work-up and consider inpatient CT abdomen pelvis with contrast, however given her renal function will hold off currently -Hold Eliquis per cardiology  A. fib with RVR, currently rate controlled  previously on Cardizem and amiodarone drip, converted to NSR and sinus bradycardia.  Drips discontinued in ED.  Echo was ordered showed grade 3 diastolic dysfunction as well as EF 60 to 65%. - Per cardiology, concern for tachybradycardia syndrome with history of stroke in the past on low-dose aspirin and Plavix.  Given her age and history of falls as well as cognitive impairment she was deemed not a good candidate for NOAC and recommended decreasing metoprolol from 50 to 25 mg twice daily as well as amiodarone load 200 mg twice daily and decrease to 200 mg daily after 7 days.  AKI on CKD 3/4  Improving.  Baseline creatinine 1.6-1.8, creatinine now 4.2-> 4.5->3.9-> 2.89 at discharge, likely from recent dye load at [place] as well as hemodynamic changes.  Renal ultrasound with increased echogenicity throughout right kidney, left kidney unremarkable. Foley removed 12/24 Nephrology consulted -Holding Lasix and losartan at discharge, follow-up outpatient with labs  Foley catheter  placed at [facility] for unknown reason Removed successfully 12/24  Mild hyperkalemia  Resolved with Lasix  Chronic diastolic heart failure  Stable. grade 3 diastolic dysfunction on echo with EF 60 to 65%. -Daily weight, I/O  Poorly controlled hypertension  -Continue amlodipine -Hydralazine added by nephro, continue hydralazine 50 mg 3 times daily at discharge -Metoprolol decreased to 25 mg twice daily -Hold home losartan given AKI follow-up outpatient  Type 2 diabetes -Continue home insulin regimen at discharge since  she is not discharged with steroids  Asymptomatic GBS UTI  -No role for antibiotics at this time unless she becomes symptomatic   Hypothyroid -Continue Synthroid     Consultants  . Nephrology . Cardiology  Procedures  . None  Antibiotics   Anti-infectives (From admission, onward)   Start     Dose/Rate Route Frequency Ordered Stop   07/30/19 1000  remdesivir 100 mg in sodium chloride 0.9 % 100 mL IVPB     100 mg 200 mL/hr over 30 Minutes Intravenous Daily 07/29/19 1255 08/01/19 2359   07/28/19 1000  remdesivir 100 mg in sodium chloride 0.9 % 100 mL IVPB  Status:  Discontinued     100 mg 200 mL/hr over 30 Minutes Intravenous Daily 07/27/19 1316 07/27/19 1345   07/28/19 1000  remdesivir 100 mg in sodium chloride 0.9 % 100 mL IVPB     100 mg 200 mL/hr over 30 Minutes Intravenous Daily 07/27/19 1350 07/29/19 0941   07/27/19 1315  remdesivir 200 mg in sodium chloride 0.9% 250 mL IVPB  Status:  Discontinued     200 mg 580 mL/hr over 30 Minutes Intravenous Once 07/27/19 1316 07/27/19 1345        Subjective  Patient seen and examined at bedside no acute distress and resting comfortably.  No events overnight.  Tolerating diet. In good spirits and anticipating discharge.  "I feel fine I want to go home "  Denies  any chest pain, shortness of breath, fever, nausea, vomiting, urinary or bowel complaints. Otherwise ROS negative   Objective   Discharge Exam: Vitals:   07/31/19 1115 07/31/19 1308  BP: (!) 167/55 (!) 178/52  Pulse: (!) 58   Resp: 13   Temp:  98.7 F (37.1 C)  SpO2: 96% 97%   Vitals:   07/31/19 0828 07/31/19 1003 07/31/19 1115 07/31/19 1308  BP: (!) 193/58 (!) 186/56 (!) 167/55 (!) 178/52  Pulse: 63  (!) 58   Resp: 15  13   Temp: 98.5 F (36.9 C)   98.7 F (37.1 C)  TempSrc: Axillary   Oral  SpO2: 98%  96% 97%  Weight:      Height:        Physical Exam Vitals and nursing note reviewed.  Constitutional:      Appearance: Normal appearance.   HENT:     Head: Normocephalic and atraumatic.     Nose: Nose normal.     Mouth/Throat:     Mouth: Mucous membranes are moist.  Eyes:     Extraocular Movements: Extraocular movements intact.  Cardiovascular:     Rate and Rhythm: Normal rate and regular rhythm.  Pulmonary:     Effort: Pulmonary effort is normal.     Breath sounds: Normal breath sounds.  Abdominal:     General: Abdomen is flat.     Palpations: Abdomen is soft.  Musculoskeletal:        General: No swelling. Normal range of motion.     Cervical back: Normal range of motion. No rigidity.  Neurological:     General: No focal deficit present.     Mental Status: She is alert. Mental status is at baseline.  Psychiatric:        Mood and Affect: Mood normal.        Behavior: Behavior normal.       The results of significant diagnostics from this hospitalization (including imaging, microbiology, ancillary and laboratory) are listed below for reference.     Microbiology: Recent Results (from the past 240 hour(s))  Culture, Urine     Status: Abnormal   Collection Time: 07/28/19  5:27 PM   Specimen: Urine, Random  Result Value Ref Range Status   Specimen Description URINE, RANDOM  Final   Special Requests NONE  Final   Culture (A)  Final    40,000 COLONIES/mL GROUP B STREP(S.AGALACTIAE)ISOLATED TESTING AGAINST S. AGALACTIAE NOT ROUTINELY PERFORMED DUE TO PREDICTABILITY OF AMP/PEN/VAN SUSCEPTIBILITY. Performed at [facility], [place] [address] [place], [place] [address]    Report Status 07/29/2019 FINAL  Final     Labs: BNP (last 3 results) No results for input(s): BNP in the last 8760 hours. Basic Metabolic Panel: Recent Labs  Lab 07/28/19 0500 07/28/19 1400 07/29/19 0315 07/30/19 0308 07/31/19 0500  NA 137 136 139 141 142  K 5.7* 5.2* 4.9 4.5 4.1  CL 104 105 104 108 109  CO2 18* 20* 21* 22 24  GLUCOSE 154* 131* 167* 143* 213*  BUN 66* 68* 73* 70* 63*  CREATININE 4.23* 4.24* 4.54* 3.90* 2.89*   CALCIUM 7.3* 7.4* 7.2* 7.2* 7.1*  MG 2.4  --  2.5* 2.3 2.3  PHOS 6.0*  --  6.5* 5.6* 4.6   Liver Function Tests: Recent Labs  Lab 07/28/19 0500 07/29/19 0315 07/30/19 0308 07/31/19 0500  AST 23 22 26  33  ALT 15 18 20 30   ALKPHOS 83 81 79 84  BILITOT 0.5 0.5 0.3  0.3  PROT 5.7* 5.0* 5.1* 5.4*  ALBUMIN 2.9* 2.5* 2.5* 2.6*   No results for input(s): LIPASE, AMYLASE in the last 168 hours. No results for input(s): AMMONIA in the last 168 hours. CBC: Recent Labs  Lab 07/28/19 0500 07/29/19 0315 07/30/19 0308 07/31/19 0500  WBC 8.0 7.0 6.9 7.4  NEUTROABS 7.1 6.1 6.0 6.2  HGB 8.9* 8.2* 8.5* 9.9*  HCT 28.1* 26.0* 27.0* 31.4*  MCV 92.1 90.6 89.7 91.3  PLT 399 346 346 369   Cardiac Enzymes: No results for input(s): CKTOTAL, CKMB, CKMBINDEX, TROPONINI in the last 168 hours. BNP: Invalid input(s): POCBNP CBG: Recent Labs  Lab 07/30/19 1147 07/30/19 1654 07/30/19 2209 07/31/19 0825 07/31/19 1145  GLUCAP 101* 140* 262* 168* 151*   D-Dimer Recent Labs    07/30/19 0308 07/31/19 0500  DDIMER 2.66* 3.94*   Hgb A1c No results for input(s): HGBA1C in the last 72 hours. Lipid Profile No results for input(s): CHOL, HDL, LDLCALC, TRIG, CHOLHDL, LDLDIRECT in the last 72 hours. Thyroid function studies No results for input(s): TSH, T4TOTAL, T3FREE, THYROIDAB in the last 72 hours.  Invalid input(s): FREET3 Anemia work up Recent Labs    07/30/19 0308 07/31/19 0500  FERRITIN 80 86   Urinalysis    Component Value Date/Time   COLORURINE YELLOW 07/28/2019 0733   APPEARANCEUR HAZY (A) 07/28/2019 0733   LABSPEC 1.024 07/28/2019 0733   PHURINE 5.0 07/28/2019 0733   GLUCOSEU 50 (A) 07/28/2019 0733   HGBUR LARGE (A) 07/28/2019 0733   BILIRUBINUR NEGATIVE 07/28/2019 0733   KETONESUR NEGATIVE 07/28/2019 0733   PROTEINUR 100 (A) 07/28/2019 0733   NITRITE NEGATIVE 07/28/2019 0733   LEUKOCYTESUR NEGATIVE 07/28/2019 0733   Sepsis Labs Invalid input(s): PROCALCITONIN,  WBC,   LACTICIDVEN Microbiology Recent Results (from the past 240 hour(s))  Culture, Urine     Status: Abnormal   Collection Time: 07/28/19  5:27 PM   Specimen: Urine, Random  Result Value Ref Range Status   Specimen Description URINE, RANDOM  Final   Special Requests NONE  Final   Culture (A)  Final    40,000 COLONIES/mL GROUP B STREP(S.AGALACTIAE)ISOLATED TESTING AGAINST S. AGALACTIAE NOT ROUTINELY PERFORMED DUE TO PREDICTABILITY OF AMP/PEN/VAN SUSCEPTIBILITY. Performed at [facility], [place] [address], [place], [place] [address]    Report Status 07/29/2019 FINAL  Final    Discharge Instructions     Discharge Instructions    Diet - low sodium heart healthy   Complete by: As directed    Discharge instructions   Complete by: As directed    You were seen and examined in the hospital for Covid 19 and cared for by a hospitalist.   Upon Discharge:  -Hold losartan for the next several days until you follow-up with your primary care physician -Decrease hydralazine to 50 mg 3 times daily -Take amlodipine 10 mg daily -Get lab work this Monday or Tuesday and schedule an appointment with your primary care physician early this week -You are taking amiodarone 200 mg twice daily for the next 4 days with your last dose on 12/29 followed by 200 mg once daily until you follow-up with your primary care and cardiologist -Decrease your metoprolol to 25 mg twice daily -Hold Lasix 40 mg until you follow-up with your primary care physician -Make an appointment with your primary care physician within 7 days -Get lab work prior to your follow up appointment with your PCP Bring all home medications to your appointment to review Request that your  primary physician go over all hospital tests and procedures/radiological results at the follow up.   Please get all hospital records sent to your physician by signing a hospital release before you go home.     Read the complete instructions along with all  the possible side effects for all the medicines you take and that have been prescribed to you. Take any new medicines after you have completely understood and accept all the possible adverse reactions/side effects.   If you have any questions about your discharge medications or the care you received while you were in the hospital, you can call the unit and asked to speak with the hospitalist on call. Once you are discharged, your primary care physician will handle any further medical issues. Please note that NO REFILLS for any discharge medications will be authorized, as it is imperative that you return to your primary care physician (or establish a relationship with a primary care physician if you do not have one) for your aftercare needs so that they can reassess your need for medications and monitor your lab values.   Do not drive, operate heavy machinery, perform activities at heights, swimming or participation in water activities or provide baby sitting services if your were admitted for loss of consciousness/seizures or if you are on sedating medications including, but not limited to benzodiazepines, sleep medications, narcotic pain medications, etc., until you have been cleared to do so by a medical doctor.   Do not take more than prescribed medications.   Wear a seat belt while driving.  If you have smoked or chewed Tobacco in the last 2 years please stop smoking; also stop any regular Alcohol and/or any Recreational drug use including marijuana.  If you experience worsening of your admission symptoms or develop shortness of breath, chest pain, suicidal or homicidal thoughts or experience a life threatening emergency, you must seek medical attention immediately by calling 911 or calling your PCP immediately.   Increase activity slowly   Complete by: As directed      Allergies as of 07/31/2019   No Known Allergies     Medication List    STOP taking these medications   losartan 100 MG  tablet Commonly known as: COZAAR   meclizine 25 MG tablet Commonly known as: ANTIVERT     TAKE these medications   acetaminophen 500 MG tablet Commonly known as: TYLENOL Take 500 mg by mouth every 6 (six) hours as needed for mild pain.   amiodarone 200 MG tablet Commonly known as: PACERONE Take 1 tablet (200 mg total) by mouth 2 (two) times daily for 4 days, THEN 1 tablet (200 mg total) daily. Start taking on: July 31, 2019   amLODipine 10 MG tablet Commonly known as: NORVASC Take 10 mg by mouth daily.   aspirin 81 MG tablet Take 1 tablet (81 mg total) by mouth daily.   atorvastatin 80 MG tablet Commonly known as: LIPITOR Take 80 mg by mouth daily at 6 PM.   clopidogrel 75 MG tablet Commonly known as: PLAVIX Take 75 mg by mouth daily.   FIBER LAXATIVE PO Take 1 tablet by mouth daily as needed.   furosemide 40 MG tablet Commonly known as: LASIX Take 1 tablet (40 mg total) by mouth daily as needed for fluid or edema (as needed for weight gain 3 lbs in 24 hours or 5 lbs in 7 days.). Start taking on: August 03, 2019 What changed: These instructions start on August 03, 2019. If you are  unsure what to do until then, ask your doctor or other care provider.   hydrALAZINE 100 MG tablet Commonly known as: APRESOLINE Take 1 tablet (100 mg total) by mouth every 8 (eight) hours.   insulin aspart 100 UNIT/ML injection Commonly known as: novoLOG Inject 0-14 Units into the skin 3 (three) times daily with meals. Sliding scale per Dr. Micheal Likens   insulin detemir 100 UNIT/ML injection Commonly known as: LEVEMIR Inject 15 Units into the skin at bedtime. What changed:   how much to take  when to take this  Another medication with the same name was removed. Continue taking this medication, and follow the directions you see here.   isosorbide mononitrate 30 MG 24 hr tablet Commonly known as: IMDUR Take 30 mg by mouth daily.   levothyroxine 75 MCG tablet Commonly known as:  SYNTHROID Take 75 mcg by mouth daily before breakfast.   Magnesium 400 MG Tabs Take 400 mg by mouth 2 (two) times daily.   metoprolol tartrate 25 MG tablet Commonly known as: LOPRESSOR Take 1 tablet (25 mg total) by mouth 2 (two) times daily. What changed:   medication strength  how much to take   mirtazapine 15 MG tablet Commonly known as: REMERON Take 15 mg by mouth at bedtime.   potassium chloride 10 MEQ tablet Commonly known as: KLOR-CON Take 1 tablet (10 mEq total) by mouth daily as needed (Take only with furosemide.).   senna 8.6 MG Tabs tablet Commonly known as: SENOKOT Take 2 tablets (17.2 mg total) by mouth 2 (two) times daily. What changed:   when to take this  reasons to take this   venlafaxine XR 37.5 MG 24 hr capsule Commonly known as: EFFEXOR-XR Take 37.5 mg by mouth daily.   vitamin B-12 1000 MCG tablet Commonly known as: CYANOCOBALAMIN Take 1,000 mcg by mouth daily.   Vitamin D (Ergocalciferol) 1.25 MG (50000 UT) Caps capsule Commonly known as: DRISDOL Take 50,000 Units by mouth every 7 (seven) days.      [facility] Follow up.   Specialty: Home Health Services Why: [facility] will resume services.  Contact information: [address] [place]  [address] [phone]          No Known Allergies  Time coordinating discharge: Over 30 minutes   SIGNED:   Harold Hedge, D.O. [facility] Pager: [phone]  07/31/2019, 1:39 PM

## 2019-07-31 NOTE — Progress Notes (Signed)
Attempted to call daughter, [name], for a second time. Called both numbers, will continue to attempt to reach her.

## 2019-08-01 LAB — CALCIUM, IONIZED: Calcium, Ionized, Serum: 4.4 mg/dL — ABNORMAL LOW (ref 4.5–5.6)

## 2019-08-02 DIAGNOSIS — B998 Other infectious disease: Secondary | ICD-10-CM | POA: Diagnosis not present

## 2019-08-05 DIAGNOSIS — E538 Deficiency of other specified B group vitamins: Secondary | ICD-10-CM | POA: Diagnosis not present

## 2019-08-05 DIAGNOSIS — Z79899 Other long term (current) drug therapy: Secondary | ICD-10-CM | POA: Diagnosis not present

## 2019-08-05 DIAGNOSIS — I10 Essential (primary) hypertension: Secondary | ICD-10-CM | POA: Diagnosis not present

## 2019-08-05 DIAGNOSIS — I4891 Unspecified atrial fibrillation: Secondary | ICD-10-CM | POA: Diagnosis not present

## 2019-08-05 DIAGNOSIS — F329 Major depressive disorder, single episode, unspecified: Secondary | ICD-10-CM | POA: Diagnosis not present

## 2019-08-05 DIAGNOSIS — E039 Hypothyroidism, unspecified: Secondary | ICD-10-CM | POA: Diagnosis not present

## 2019-08-05 DIAGNOSIS — U071 COVID-19: Secondary | ICD-10-CM | POA: Diagnosis not present

## 2019-08-05 DIAGNOSIS — Z9889 Other specified postprocedural states: Secondary | ICD-10-CM | POA: Diagnosis not present

## 2019-08-05 DIAGNOSIS — I50810 Right heart failure, unspecified: Secondary | ICD-10-CM | POA: Diagnosis not present

## 2019-08-05 DIAGNOSIS — E785 Hyperlipidemia, unspecified: Secondary | ICD-10-CM | POA: Diagnosis not present

## 2019-08-05 DIAGNOSIS — R5381 Other malaise: Secondary | ICD-10-CM | POA: Diagnosis not present

## 2019-08-05 DIAGNOSIS — Z8673 Personal history of transient ischemic attack (TIA), and cerebral infarction without residual deficits: Secondary | ICD-10-CM | POA: Diagnosis not present

## 2019-08-08 DIAGNOSIS — J90 Pleural effusion, not elsewhere classified: Secondary | ICD-10-CM | POA: Diagnosis not present

## 2019-08-08 DIAGNOSIS — N183 Chronic kidney disease, stage 3 unspecified: Secondary | ICD-10-CM | POA: Diagnosis not present

## 2019-08-08 DIAGNOSIS — I5032 Chronic diastolic (congestive) heart failure: Secondary | ICD-10-CM | POA: Diagnosis not present

## 2019-08-08 DIAGNOSIS — R911 Solitary pulmonary nodule: Secondary | ICD-10-CM | POA: Diagnosis not present

## 2019-08-08 DIAGNOSIS — J189 Pneumonia, unspecified organism: Secondary | ICD-10-CM | POA: Diagnosis not present

## 2019-08-08 DIAGNOSIS — E1122 Type 2 diabetes mellitus with diabetic chronic kidney disease: Secondary | ICD-10-CM | POA: Diagnosis not present

## 2019-08-08 DIAGNOSIS — I249 Acute ischemic heart disease, unspecified: Secondary | ICD-10-CM | POA: Diagnosis not present

## 2019-08-08 DIAGNOSIS — Z7982 Long term (current) use of aspirin: Secondary | ICD-10-CM | POA: Diagnosis not present

## 2019-08-08 DIAGNOSIS — E11649 Type 2 diabetes mellitus with hypoglycemia without coma: Secondary | ICD-10-CM | POA: Diagnosis not present

## 2019-08-08 DIAGNOSIS — I130 Hypertensive heart and chronic kidney disease with heart failure and stage 1 through stage 4 chronic kidney disease, or unspecified chronic kidney disease: Secondary | ICD-10-CM | POA: Diagnosis not present

## 2019-08-08 DIAGNOSIS — Z7902 Long term (current) use of antithrombotics/antiplatelets: Secondary | ICD-10-CM | POA: Diagnosis not present

## 2019-08-08 DIAGNOSIS — U071 COVID-19: Secondary | ICD-10-CM | POA: Diagnosis not present

## 2019-08-08 DIAGNOSIS — S72001D Fracture of unspecified part of neck of right femur, subsequent encounter for closed fracture with routine healing: Secondary | ICD-10-CM | POA: Diagnosis not present

## 2019-08-08 DIAGNOSIS — R296 Repeated falls: Secondary | ICD-10-CM | POA: Diagnosis not present

## 2019-08-08 DIAGNOSIS — J9601 Acute respiratory failure with hypoxia: Secondary | ICD-10-CM | POA: Diagnosis not present

## 2019-08-08 DIAGNOSIS — E039 Hypothyroidism, unspecified: Secondary | ICD-10-CM | POA: Diagnosis not present

## 2019-08-08 DIAGNOSIS — M1990 Unspecified osteoarthritis, unspecified site: Secondary | ICD-10-CM | POA: Diagnosis not present

## 2019-08-08 DIAGNOSIS — Z8673 Personal history of transient ischemic attack (TIA), and cerebral infarction without residual deficits: Secondary | ICD-10-CM | POA: Diagnosis not present

## 2019-08-08 DIAGNOSIS — Z853 Personal history of malignant neoplasm of breast: Secondary | ICD-10-CM | POA: Diagnosis not present

## 2019-08-08 DIAGNOSIS — E1165 Type 2 diabetes mellitus with hyperglycemia: Secondary | ICD-10-CM | POA: Diagnosis not present

## 2019-08-08 DIAGNOSIS — E1110 Type 2 diabetes mellitus with ketoacidosis without coma: Secondary | ICD-10-CM | POA: Diagnosis not present

## 2019-08-08 DIAGNOSIS — Z96641 Presence of right artificial hip joint: Secondary | ICD-10-CM | POA: Diagnosis not present

## 2019-08-08 DIAGNOSIS — I4891 Unspecified atrial fibrillation: Secondary | ICD-10-CM | POA: Diagnosis not present

## 2019-08-08 DIAGNOSIS — E785 Hyperlipidemia, unspecified: Secondary | ICD-10-CM | POA: Diagnosis not present

## 2019-08-08 DIAGNOSIS — N179 Acute kidney failure, unspecified: Secondary | ICD-10-CM | POA: Diagnosis not present

## 2019-08-09 ENCOUNTER — Inpatient Hospital Stay: Admission: AD | Admit: 2019-08-09 | Payer: PPO | Source: Other Acute Inpatient Hospital | Admitting: Family Medicine

## 2019-08-09 DIAGNOSIS — N179 Acute kidney failure, unspecified: Secondary | ICD-10-CM | POA: Diagnosis not present

## 2019-08-09 DIAGNOSIS — Z8673 Personal history of transient ischemic attack (TIA), and cerebral infarction without residual deficits: Secondary | ICD-10-CM | POA: Diagnosis not present

## 2019-08-09 DIAGNOSIS — I499 Cardiac arrhythmia, unspecified: Secondary | ICD-10-CM | POA: Diagnosis not present

## 2019-08-09 DIAGNOSIS — E860 Dehydration: Secondary | ICD-10-CM | POA: Diagnosis not present

## 2019-08-09 DIAGNOSIS — Z794 Long term (current) use of insulin: Secondary | ICD-10-CM | POA: Diagnosis not present

## 2019-08-09 DIAGNOSIS — E1165 Type 2 diabetes mellitus with hyperglycemia: Secondary | ICD-10-CM | POA: Diagnosis not present

## 2019-08-09 DIAGNOSIS — J189 Pneumonia, unspecified organism: Secondary | ICD-10-CM | POA: Diagnosis not present

## 2019-08-09 DIAGNOSIS — I4510 Unspecified right bundle-branch block: Secondary | ICD-10-CM | POA: Diagnosis not present

## 2019-08-09 DIAGNOSIS — Z7902 Long term (current) use of antithrombotics/antiplatelets: Secondary | ICD-10-CM | POA: Diagnosis not present

## 2019-08-09 DIAGNOSIS — Z7982 Long term (current) use of aspirin: Secondary | ICD-10-CM | POA: Diagnosis not present

## 2019-08-09 DIAGNOSIS — N3001 Acute cystitis with hematuria: Secondary | ICD-10-CM | POA: Diagnosis not present

## 2019-08-09 DIAGNOSIS — N390 Urinary tract infection, site not specified: Secondary | ICD-10-CM | POA: Diagnosis not present

## 2019-08-09 DIAGNOSIS — A419 Sepsis, unspecified organism: Secondary | ICD-10-CM | POA: Diagnosis not present

## 2019-08-09 DIAGNOSIS — J159 Unspecified bacterial pneumonia: Secondary | ICD-10-CM | POA: Diagnosis not present

## 2019-08-09 DIAGNOSIS — Z7901 Long term (current) use of anticoagulants: Secondary | ICD-10-CM | POA: Diagnosis not present

## 2019-08-09 DIAGNOSIS — J1282 Pneumonia due to coronavirus disease 2019: Secondary | ICD-10-CM | POA: Diagnosis not present

## 2019-08-09 DIAGNOSIS — I340 Nonrheumatic mitral (valve) insufficiency: Secondary | ICD-10-CM

## 2019-08-09 DIAGNOSIS — Z853 Personal history of malignant neoplasm of breast: Secondary | ICD-10-CM | POA: Diagnosis not present

## 2019-08-09 DIAGNOSIS — I808 Phlebitis and thrombophlebitis of other sites: Secondary | ICD-10-CM | POA: Diagnosis not present

## 2019-08-09 DIAGNOSIS — M79661 Pain in right lower leg: Secondary | ICD-10-CM | POA: Diagnosis not present

## 2019-08-09 DIAGNOSIS — U071 COVID-19: Secondary | ICD-10-CM | POA: Diagnosis not present

## 2019-08-09 DIAGNOSIS — R0602 Shortness of breath: Secondary | ICD-10-CM | POA: Diagnosis not present

## 2019-08-09 DIAGNOSIS — R509 Fever, unspecified: Secondary | ICD-10-CM | POA: Diagnosis not present

## 2019-08-09 DIAGNOSIS — Z751 Person awaiting admission to adequate facility elsewhere: Secondary | ICD-10-CM | POA: Diagnosis not present

## 2019-08-09 DIAGNOSIS — R069 Unspecified abnormalities of breathing: Secondary | ICD-10-CM | POA: Diagnosis not present

## 2019-08-09 DIAGNOSIS — E039 Hypothyroidism, unspecified: Secondary | ICD-10-CM | POA: Diagnosis not present

## 2019-08-09 DIAGNOSIS — M7989 Other specified soft tissue disorders: Secondary | ICD-10-CM | POA: Diagnosis not present

## 2019-08-09 DIAGNOSIS — Z79899 Other long term (current) drug therapy: Secondary | ICD-10-CM | POA: Diagnosis not present

## 2019-08-09 DIAGNOSIS — J9601 Acute respiratory failure with hypoxia: Secondary | ICD-10-CM | POA: Diagnosis not present

## 2019-08-09 DIAGNOSIS — J90 Pleural effusion, not elsewhere classified: Secondary | ICD-10-CM

## 2019-08-09 DIAGNOSIS — Z8616 Personal history of COVID-19: Secondary | ICD-10-CM | POA: Diagnosis not present

## 2019-08-09 DIAGNOSIS — I10 Essential (primary) hypertension: Secondary | ICD-10-CM | POA: Diagnosis not present

## 2019-08-09 DIAGNOSIS — G9341 Metabolic encephalopathy: Secondary | ICD-10-CM | POA: Diagnosis not present

## 2019-08-09 DIAGNOSIS — M79662 Pain in left lower leg: Secondary | ICD-10-CM | POA: Diagnosis not present

## 2019-08-09 DIAGNOSIS — R9431 Abnormal electrocardiogram [ECG] [EKG]: Secondary | ICD-10-CM | POA: Diagnosis not present

## 2019-08-09 DIAGNOSIS — I361 Nonrheumatic tricuspid (valve) insufficiency: Secondary | ICD-10-CM

## 2019-08-09 DIAGNOSIS — I4891 Unspecified atrial fibrillation: Secondary | ICD-10-CM | POA: Diagnosis not present

## 2019-08-10 DIAGNOSIS — J9601 Acute respiratory failure with hypoxia: Secondary | ICD-10-CM

## 2019-08-10 DIAGNOSIS — J189 Pneumonia, unspecified organism: Secondary | ICD-10-CM

## 2019-08-10 DIAGNOSIS — R9431 Abnormal electrocardiogram [ECG] [EKG]: Secondary | ICD-10-CM

## 2019-08-10 DIAGNOSIS — I10 Essential (primary) hypertension: Secondary | ICD-10-CM

## 2019-08-11 DIAGNOSIS — I4891 Unspecified atrial fibrillation: Secondary | ICD-10-CM

## 2019-08-13 ENCOUNTER — Inpatient Hospital Stay (HOSPITAL_COMMUNITY)
Admission: AD | Admit: 2019-08-13 | Discharge: 2019-08-18 | DRG: 177 | Disposition: A | Discharge status: Skilled Nursing Facility | Payer: PPO | Source: Other Acute Inpatient Hospital | Attending: Internal Medicine | Admitting: Internal Medicine

## 2019-08-13 ENCOUNTER — Inpatient Hospital Stay: Payer: Self-pay

## 2019-08-13 DIAGNOSIS — J1282 Pneumonia due to coronavirus disease 2019: Secondary | ICD-10-CM | POA: Diagnosis not present

## 2019-08-13 DIAGNOSIS — N184 Chronic kidney disease, stage 4 (severe): Secondary | ICD-10-CM | POA: Diagnosis present

## 2019-08-13 DIAGNOSIS — R262 Difficulty in walking, not elsewhere classified: Secondary | ICD-10-CM | POA: Diagnosis not present

## 2019-08-13 DIAGNOSIS — I5033 Acute on chronic diastolic (congestive) heart failure: Secondary | ICD-10-CM | POA: Diagnosis present

## 2019-08-13 DIAGNOSIS — Z79899 Other long term (current) drug therapy: Secondary | ICD-10-CM

## 2019-08-13 DIAGNOSIS — I480 Paroxysmal atrial fibrillation: Secondary | ICD-10-CM | POA: Diagnosis present

## 2019-08-13 DIAGNOSIS — E876 Hypokalemia: Secondary | ICD-10-CM | POA: Diagnosis present

## 2019-08-13 DIAGNOSIS — I130 Hypertensive heart and chronic kidney disease with heart failure and stage 1 through stage 4 chronic kidney disease, or unspecified chronic kidney disease: Secondary | ICD-10-CM | POA: Diagnosis present

## 2019-08-13 DIAGNOSIS — Z853 Personal history of malignant neoplasm of breast: Secondary | ICD-10-CM | POA: Diagnosis not present

## 2019-08-13 DIAGNOSIS — E039 Hypothyroidism, unspecified: Secondary | ICD-10-CM | POA: Diagnosis present

## 2019-08-13 DIAGNOSIS — J9601 Acute respiratory failure with hypoxia: Secondary | ICD-10-CM | POA: Diagnosis not present

## 2019-08-13 DIAGNOSIS — Z7902 Long term (current) use of antithrombotics/antiplatelets: Secondary | ICD-10-CM

## 2019-08-13 DIAGNOSIS — I4811 Longstanding persistent atrial fibrillation: Secondary | ICD-10-CM | POA: Diagnosis not present

## 2019-08-13 DIAGNOSIS — Z7982 Long term (current) use of aspirin: Secondary | ICD-10-CM | POA: Diagnosis not present

## 2019-08-13 DIAGNOSIS — J189 Pneumonia, unspecified organism: Secondary | ICD-10-CM | POA: Diagnosis not present

## 2019-08-13 DIAGNOSIS — M6281 Muscle weakness (generalized): Secondary | ICD-10-CM | POA: Diagnosis not present

## 2019-08-13 DIAGNOSIS — F09 Unspecified mental disorder due to known physiological condition: Secondary | ICD-10-CM | POA: Diagnosis not present

## 2019-08-13 DIAGNOSIS — E11649 Type 2 diabetes mellitus with hypoglycemia without coma: Secondary | ICD-10-CM | POA: Diagnosis not present

## 2019-08-13 DIAGNOSIS — Z743 Need for continuous supervision: Secondary | ICD-10-CM | POA: Diagnosis not present

## 2019-08-13 DIAGNOSIS — R9431 Abnormal electrocardiogram [ECG] [EKG]: Secondary | ICD-10-CM | POA: Diagnosis not present

## 2019-08-13 DIAGNOSIS — Z87891 Personal history of nicotine dependence: Secondary | ICD-10-CM

## 2019-08-13 DIAGNOSIS — I639 Cerebral infarction, unspecified: Secondary | ICD-10-CM | POA: Diagnosis not present

## 2019-08-13 DIAGNOSIS — N179 Acute kidney failure, unspecified: Secondary | ICD-10-CM | POA: Diagnosis present

## 2019-08-13 DIAGNOSIS — Z794 Long term (current) use of insulin: Secondary | ICD-10-CM | POA: Diagnosis not present

## 2019-08-13 DIAGNOSIS — S72001D Fracture of unspecified part of neck of right femur, subsequent encounter for closed fracture with routine healing: Secondary | ICD-10-CM | POA: Diagnosis not present

## 2019-08-13 DIAGNOSIS — I10 Essential (primary) hypertension: Secondary | ICD-10-CM | POA: Diagnosis not present

## 2019-08-13 DIAGNOSIS — E1122 Type 2 diabetes mellitus with diabetic chronic kidney disease: Secondary | ICD-10-CM | POA: Diagnosis present

## 2019-08-13 DIAGNOSIS — U071 COVID-19: Secondary | ICD-10-CM | POA: Diagnosis present

## 2019-08-13 DIAGNOSIS — E119 Type 2 diabetes mellitus without complications: Secondary | ICD-10-CM | POA: Diagnosis not present

## 2019-08-13 DIAGNOSIS — Z66 Do not resuscitate: Secondary | ICD-10-CM | POA: Diagnosis not present

## 2019-08-13 DIAGNOSIS — E785 Hyperlipidemia, unspecified: Secondary | ICD-10-CM | POA: Diagnosis present

## 2019-08-13 DIAGNOSIS — Z90710 Acquired absence of both cervix and uterus: Secondary | ICD-10-CM | POA: Diagnosis not present

## 2019-08-13 DIAGNOSIS — J9621 Acute and chronic respiratory failure with hypoxia: Secondary | ICD-10-CM | POA: Diagnosis not present

## 2019-08-13 DIAGNOSIS — I4891 Unspecified atrial fibrillation: Secondary | ICD-10-CM | POA: Diagnosis not present

## 2019-08-13 DIAGNOSIS — Z8673 Personal history of transient ischemic attack (TIA), and cerebral infarction without residual deficits: Secondary | ICD-10-CM

## 2019-08-13 DIAGNOSIS — Z833 Family history of diabetes mellitus: Secondary | ICD-10-CM | POA: Diagnosis not present

## 2019-08-13 DIAGNOSIS — Z79890 Hormone replacement therapy (postmenopausal): Secondary | ICD-10-CM

## 2019-08-13 DIAGNOSIS — R279 Unspecified lack of coordination: Secondary | ICD-10-CM | POA: Diagnosis not present

## 2019-08-13 LAB — CREATININE, SERUM
Creatinine, Ser: 1.54 mg/dL — ABNORMAL HIGH (ref 0.44–1.00)
GFR calc Af Amer: 35 mL/min — ABNORMAL LOW (ref >60)
GFR calc non Af Amer: 30 mL/min — ABNORMAL LOW (ref >60)

## 2019-08-13 LAB — D-DIMER, QUANTITATIVE: D-Dimer, Quant: 2.56 ug/mL-FEU — ABNORMAL HIGH (ref 0.00–0.50)

## 2019-08-13 LAB — URINALYSIS, ROUTINE W REFLEX MICROSCOPIC
Bilirubin Urine: NEGATIVE
Glucose, UA: >=500 mg/dL — AB
Hgb urine dipstick: NEGATIVE
Ketones, ur: 5 mg/dL — AB
Leukocytes,Ua: NEGATIVE
Nitrite: NEGATIVE
Protein, ur: 100 mg/dL — AB
Specific Gravity, Urine: 1.011 (ref 1.005–1.030)
pH: 5 (ref 5.0–8.0)

## 2019-08-13 LAB — CBC
HCT: 25.8 % — ABNORMAL LOW (ref 36.0–46.0)
Hemoglobin: 8 g/dL — ABNORMAL LOW (ref 12.0–15.0)
MCH: 27.5 pg (ref 26.0–34.0)
MCHC: 31 g/dL (ref 30.0–36.0)
MCV: 88.7 fL (ref 80.0–100.0)
Platelets: 205 10*3/uL (ref 150–400)
RBC: 2.91 MIL/uL — ABNORMAL LOW (ref 3.87–5.11)
RDW: 15.5 % (ref 11.5–15.5)
WBC: 13.7 10*3/uL — ABNORMAL HIGH (ref 4.0–10.5)
nRBC: 0.1 % (ref 0.0–0.2)

## 2019-08-13 LAB — GLUCOSE, CAPILLARY
Glucose-Capillary: 152 mg/dL — ABNORMAL HIGH (ref 70–99)
Glucose-Capillary: 238 mg/dL — ABNORMAL HIGH (ref 70–99)
Glucose-Capillary: 171 mg/dL — ABNORMAL HIGH (ref 70–99)

## 2019-08-13 LAB — SODIUM, URINE, RANDOM: Sodium, Ur: 60 mmol/L

## 2019-08-13 LAB — HEMOGLOBIN A1C
Hgb A1c MFr Bld: 7.9 % — ABNORMAL HIGH (ref 4.8–5.6)
Mean Plasma Glucose: 180.03 mg/dL

## 2019-08-13 LAB — ABO/RH: ABO/RH(D): B POS

## 2019-08-13 LAB — PROCALCITONIN: Procalcitonin: 0.48 ng/mL

## 2019-08-13 LAB — BRAIN NATRIURETIC PEPTIDE: B Natriuretic Peptide: 831 pg/mL — ABNORMAL HIGH (ref 0.0–100.0)

## 2019-08-13 LAB — OSMOLALITY: Osmolality: 293 mOsm/kg (ref 275–295)

## 2019-08-13 LAB — CREATININE, URINE, RANDOM: Creatinine, Urine: 40.86 mg/dL

## 2019-08-13 LAB — URIC ACID: Uric Acid, Serum: 4 mg/dL (ref 2.5–7.1)

## 2019-08-13 LAB — C-REACTIVE PROTEIN: CRP: 7.1 mg/dL — ABNORMAL HIGH (ref <1.0)

## 2019-08-13 LAB — MAGNESIUM: Magnesium: 1.9 mg/dL (ref 1.7–2.4)

## 2019-08-13 LAB — TSH: TSH: 0.794 u[IU]/mL (ref 0.350–4.500)

## 2019-08-13 MED ORDER — INSULIN ASPART 100 UNIT/ML ~~LOC~~ SOLN: 0.0000 [IU] | Freq: Every day | SUBCUTANEOUS | Status: DC

## 2019-08-13 MED ORDER — ACETAMINOPHEN 325 MG PO TABS: 650.0000 mg | ORAL_TABLET | Freq: Four times a day (QID) | ORAL | Status: DC | PRN

## 2019-08-13 MED ORDER — BISACODYL 5 MG PO TBEC: 5.0000 mg | DELAYED_RELEASE_TABLET | Freq: Every day | ORAL | Status: DC | PRN

## 2019-08-13 MED ORDER — ONDANSETRON HCL 4 MG PO TABS: 4.0000 mg | ORAL_TABLET | Freq: Four times a day (QID) | ORAL | Status: DC | PRN

## 2019-08-13 MED ORDER — ONDANSETRON HCL 4 MG/2ML IJ SOLN: 4.0000 mg | Freq: Four times a day (QID) | INTRAMUSCULAR | Status: DC | PRN

## 2019-08-13 MED ORDER — SODIUM CHLORIDE 0.9 % IV SOLN
100.0000 mg | Freq: Every day | INTRAVENOUS | Status: AC
  Administered 2019-08-14 – 2019-08-17 (×4): 100 mg via INTRAVENOUS
  Filled 2019-08-13 (×3): qty 20

## 2019-08-13 MED ORDER — SODIUM CHLORIDE 0.9 % IV SOLN
200.0000 mg | Freq: Once | INTRAVENOUS | Status: AC
  Administered 2019-08-13: 14:00:00 200 mg via INTRAVENOUS
  Filled 2019-08-13: qty 40

## 2019-08-13 MED ORDER — DILTIAZEM HCL 25 MG/5ML IV SOLN
10.0000 mg | Freq: Four times a day (QID) | INTRAVENOUS | Status: DC | PRN
  Administered 2019-08-13 – 2019-08-14 (×2): 10 mg via INTRAVENOUS
  Filled 2019-08-13 (×4): qty 5

## 2019-08-13 MED ORDER — SODIUM CHLORIDE 0.9% FLUSH: 10.0000 mL | INTRAVENOUS | Status: DC | PRN

## 2019-08-13 MED ORDER — LEVOTHYROXINE SODIUM 75 MCG PO TABS
75.0000 ug | ORAL_TABLET | Freq: Every day | ORAL | Status: DC
  Administered 2019-08-14 – 2019-08-18 (×5): 75 ug via ORAL
  Filled 2019-08-13 (×5): qty 1

## 2019-08-13 MED ORDER — METOPROLOL TARTRATE 25 MG PO TABS
100.0000 mg | ORAL_TABLET | Freq: Two times a day (BID) | ORAL | Status: DC
  Administered 2019-08-13 (×2): 100 mg via ORAL
  Filled 2019-08-13 (×2): qty 4

## 2019-08-13 MED ORDER — VENLAFAXINE HCL ER 37.5 MG PO CP24
37.5000 mg | ORAL_CAPSULE | Freq: Every day | ORAL | Status: DC
  Administered 2019-08-13 – 2019-08-18 (×6): 37.5 mg via ORAL
  Filled 2019-08-13 (×6): qty 1

## 2019-08-13 MED ORDER — DIGOXIN 0.25 MG/ML IJ SOLN
0.1250 mg | Freq: Four times a day (QID) | INTRAMUSCULAR | Status: AC
  Administered 2019-08-13 (×2): 0.125 mg via INTRAVENOUS
  Filled 2019-08-13 (×2): qty 0.5

## 2019-08-13 MED ORDER — HEPARIN SODIUM (PORCINE) 5000 UNIT/ML IJ SOLN
5000.0000 [IU] | Freq: Three times a day (TID) | INTRAMUSCULAR | Status: DC
  Administered 2019-08-13 – 2019-08-18 (×16): 5000 [IU] via SUBCUTANEOUS
  Filled 2019-08-13 (×15): qty 1

## 2019-08-13 MED ORDER — INSULIN ASPART 100 UNIT/ML ~~LOC~~ SOLN
0.0000 [IU] | Freq: Three times a day (TID) | SUBCUTANEOUS | Status: DC
  Administered 2019-08-13: 17:00:00 5 [IU] via SUBCUTANEOUS
  Administered 2019-08-13: 3 [IU] via SUBCUTANEOUS

## 2019-08-13 MED ORDER — ATORVASTATIN CALCIUM 40 MG PO TABS
80.0000 mg | ORAL_TABLET | Freq: Every day | ORAL | Status: DC
  Administered 2019-08-13 – 2019-08-17 (×5): 80 mg via ORAL
  Filled 2019-08-13 (×5): qty 2

## 2019-08-13 MED ORDER — ALBUTEROL SULFATE HFA 108 (90 BASE) MCG/ACT IN AERS
2.0000 | INHALATION_SPRAY | Freq: Four times a day (QID) | RESPIRATORY_TRACT | Status: DC | PRN
  Filled 2019-08-13: qty 6.7

## 2019-08-13 MED ORDER — ASPIRIN 81 MG PO TBEC
81.0000 mg | DELAYED_RELEASE_TABLET | Freq: Every day | ORAL | Status: DC
  Administered 2019-08-13 – 2019-08-18 (×6): 81 mg via ORAL
  Filled 2019-08-13 (×12): qty 1

## 2019-08-13 MED ORDER — CLOPIDOGREL BISULFATE 75 MG PO TABS
75.0000 mg | ORAL_TABLET | Freq: Every day | ORAL | Status: DC
  Administered 2019-08-13 – 2019-08-18 (×6): 75 mg via ORAL
  Filled 2019-08-13 (×8): qty 1

## 2019-08-13 MED ORDER — INSULIN DETEMIR 100 UNIT/ML ~~LOC~~ SOLN
10.0000 [IU] | Freq: Two times a day (BID) | SUBCUTANEOUS | Status: DC
  Administered 2019-08-13 (×2): 10 [IU] via SUBCUTANEOUS
  Filled 2019-08-13 (×3): qty 0.1

## 2019-08-13 MED ORDER — DEXAMETHASONE SODIUM PHOSPHATE 10 MG/ML IJ SOLN
6.0000 mg | INTRAMUSCULAR | Status: DC
  Administered 2019-08-13: 11:00:00 6 mg via INTRAVENOUS
  Filled 2019-08-13: qty 1

## 2019-08-13 MED ORDER — SODIUM CHLORIDE 0.9% FLUSH
10.0000 mL | Freq: Two times a day (BID) | INTRAVENOUS | Status: DC
  Administered 2019-08-13 – 2019-08-14 (×2): 10 mL

## 2019-08-13 MED ORDER — AMIODARONE HCL 200 MG PO TABS
200.0000 mg | ORAL_TABLET | Freq: Every day | ORAL | Status: DC
  Administered 2019-08-13 – 2019-08-18 (×6): 200 mg via ORAL
  Filled 2019-08-13: qty 2
  Filled 2019-08-13: qty 1
  Filled 2019-08-13 (×2): qty 2
  Filled 2019-08-13: qty 1
  Filled 2019-08-13: qty 2
  Filled 2019-08-13: qty 1

## 2019-08-13 NOTE — H&P (Signed)
TRH H&P   Patient Demographics:    Via Amber Ballard, is a 86 y.o. female  MRN: [id number]   DOB - 07/05/1932  Admit Date - 08/13/2019  Outpatient Primary MD for the patient is Amber Ballard., MD   Patient coming from: [facility] ER  CC - SOB    HPI:    Amber Ballard  is a 86 y.o. female, with past medical history of paroxysmal atrial fibrillation [place] vas 2 score of 3, not on anticoagulation due to falls, recent left hip fracture with repair about 1 month ago, hypothyroidism, hypertension, dyslipidemia, DM type II, stroke, who was diagnosed with COVID-19 infection about 10 days ago at [place].  She developed some shortness of breath and came to [facility] ER where she was diagnosed with acute hypoxic respiratory failure and sent to [facility] for further care.  She was also found to have A. fib RVR, AKI during her stay at [facility] ER.  Patient presently came here on 4 to 5 L nasal cannula oxygen, appears to be in no distress, has crackles on exam, she denies any discomfort and says she might have some shortness of breath upon ambulating, mildly confused but overall stable and able to answer questions and follow commands appropriately.  Denies any headache, no chest or abdominal pain.  No diarrhea or dysuria.  No focal weakness.   Review of systems:    A full 10 point Review of Systems was done, except as stated above, all other Review of Systems were negative.   With Past History of the following :    Past Medical History:  Diagnosis Date  . Anxiety   . Arthritis   . Bradycardia   . Breast cancer ([place])   . Chronic diastolic (congestive) heart failure ([place])   . Diabetes mellitus   . Heart murmur   . Hyperlipidemia   .  Hypertension   . Hypothyroidism   . PAF (paroxysmal atrial fibrillation) ([place])   . PONV (postoperative nausea and vomiting)   . Stroke ([place])   . Wears glasses       Past Surgical History:  Procedure Laterality Date  . ABDOMINAL HYSTERECTOMY    . BREAST SURGERY     lt lumpectomy-snbx  . COLONOSCOPY    . EYE SURGERY     both cataracts  . FRACTURE SURGERY  rt arm,rt fingers  . ORIF ANKLE FRACTURE  03/26/2012   Procedure: OPEN REDUCTION INTERNAL FIXATION (ORIF) ANKLE FRACTURE;  Surgeon: Wylene Simmer, MD;  Location: [place];  Service: Orthopedics;  Laterality: Right;  Open Reduction Internal Fixation Right Ankle Trimalleolar Fracture       Social History:     Social History   Tobacco Use  . Smoking status: Former Smoker    Quit date: 03/24/2010    Years since quitting: 9.3  . Smokeless tobacco: Never Used  Substance Use Topics  . Alcohol use: No         Family History :     Family History  Problem Relation Age of Onset  . Diabetes Mother        Home Medications:   Prior to Admission medications   Medication Sig Start Date End Date Taking? Authorizing Provider  acetaminophen (TYLENOL) 500 MG tablet Take 500 mg by mouth every 6 (six) hours as needed for mild pain.    [provider]  amiodarone (PACERONE) 200 MG tablet Take 1 tablet (200 mg total) by mouth 2 (two) times daily for 4 days, THEN 1 tablet (200 mg total) daily. 07/31/19 10/03/19  Harold Hedge, MD  amLODipine (NORVASC) 10 MG tablet Take 10 mg by mouth daily.  04/14/18   [provider]  aspirin 81 MG tablet Take 1 tablet (81 mg total) by mouth daily. 09/14/18   Garvin Fila, MD  atorvastatin (LIPITOR) 80 MG tablet Take 80 mg by mouth daily at 6 PM.  08/17/18   [provider]  Calcium Polycarbophil (FIBER LAXATIVE PO) Take 1 tablet by mouth daily as needed.     [provider]  clopidogrel (PLAVIX) 75 MG tablet Take 75 mg by mouth daily.    [provider]    furosemide (LASIX) 40 MG tablet Take 1 tablet (40 mg total) by mouth daily as needed for fluid or edema (as needed for weight gain 3 lbs in 24 hours or 5 lbs in 7 days.). 08/03/19   Harold Hedge, MD  hydrALAZINE (APRESOLINE) 100 MG tablet Take 1 tablet (100 mg total) by mouth every 8 (eight) hours. 07/31/19 08/31/19  Harold Hedge, MD  insulin aspart (NOVOLOG) 100 UNIT/ML injection Inject 0-14 Units into the skin 3 (three) times daily with meals. Sliding scale per Dr. Micheal Likens    [provider]  insulin detemir (LEVEMIR) 100 UNIT/ML injection Inject 15 Units into the skin at bedtime. Patient taking differently: Inject 10 Units into the skin 2 (two) times daily.  03/30/12   Wylene Simmer, MD  isosorbide mononitrate (IMDUR) 30 MG 24 hr tablet Take 30 mg by mouth daily. 07/19/19   [provider]  levothyroxine (SYNTHROID, LEVOTHROID) 75 MCG tablet Take 75 mcg by mouth daily before breakfast.  08/17/18   [provider]  Magnesium 400 MG TABS Take 400 mg by mouth 2 (two) times daily.    [provider]  metoprolol tartrate (LOPRESSOR) 25 MG tablet Take 1 tablet (25 mg total) by mouth 2 (two) times daily. 07/31/19   Harold Hedge, MD  mirtazapine (REMERON) 15 MG tablet Take 15 mg by mouth at bedtime. 05/10/19   [provider]  potassium chloride (K-DUR) 10 MEQ tablet Take 1 tablet (10 mEq total) by mouth daily as needed (Take only with furosemide.). 01/22/19   Arrien, Jimmy Picket, MD  senna (SENOKOT) 8.6 MG TABS Take 2 tablets (17.2 mg total) by mouth 2 (two)  times daily. Patient taking differently: Take 2 tablets by mouth daily as needed.  03/29/12   Wylene Simmer, MD  venlafaxine XR (EFFEXOR-XR) 37.5 MG 24 hr capsule Take 37.5 mg by mouth daily. 07/13/19   [provider]  vitamin B-12 (CYANOCOBALAMIN) 1000 MCG tablet Take 1,000 mcg by mouth daily.    [provider]  Vitamin D, Ergocalciferol, (DRISDOL) 1.25 MG (50000 UT) CAPS capsule Take  50,000 Units by mouth every 7 (seven) days.    [provider]     Allergies:    No Known Allergies   Physical Exam:   Vitals  Blood pressure (!) 129/96, pulse (!) 104, temperature (!) 97.3 F (36.3 C), temperature source Oral, resp. rate 14, SpO2 93 %.   1. General elderly white female in no distress lying in hospital bed wearing oxygen,  2. Normal affect and insight, Not Suicidal or Homicidal, Awake Alert, could be slightly confused,  3. No F.N deficits, ALL C.Nerves Intact, Strength 5/5 all 4 extremities, Sensation intact all 4 extremities, Plantars down going.  4. Ears and Eyes appear Normal, Conjunctivae clear, PERRLA. Moist Oral Mucosa.  5. Supple Neck, No JVD, No cervical lymphadenopathy appriciated, No Carotid Bruits.  6. Symmetrical Chest wall movement, Good air movement bilaterally, +ve rales  7. iRRR, No Gallops, Rubs or Murmurs, No Parasternal Heave.  8. Positive Bowel Sounds, Abdomen Soft, No tenderness, No organomegaly appriciated,No rebound -guarding or rigidity.  9.  No Cyanosis, Normal Skin Turgor, No Skin Rash or Bruise.  10. Good muscle tone,  joints appear normal , no effusions, Normal ROM.  11. No Palpable Lymph Nodes in Neck or Axillae      Data Review:    CBC No results for input(s): WBC, HGB, HCT, PLT, MCV, MCH, MCHC, RDW, LYMPHSABS, MONOABS, EOSABS, BASOSABS, BANDABS in the last 168 hours.  Invalid input(s): NEUTRABS, BANDSABD ------------------------------------------------------------------------------------------------------------------  Chemistries  No results for input(s): NA, K, CL, CO2, GLUCOSE, BUN, CREATININE, CALCIUM, MG, AST, ALT, ALKPHOS, BILITOT in the last 168 hours.  Invalid input(s): GFRCGP ------------------------------------------------------------------------------------------------------------------ estimated creatinine clearance is 12.6 mL/min (A) (by C-G formula based on SCr of 2.89 mg/dL  (H)). ------------------------------------------------------------------------------------------------------------------ No results for input(s): TSH, T4TOTAL, T3FREE, THYROIDAB in the last 72 hours.  Invalid input(s): FREET3  Coagulation profile No results for input(s): INR, PROTIME in the last 168 hours. ------------------------------------------------------------------------------------------------------------------- No results for input(s): DDIMER in the last 72 hours. -------------------------------------------------------------------------------------------------------------------  Cardiac Enzymes No results for input(s): CKMB, TROPONINI, MYOGLOBIN in the last 168 hours.  Invalid input(s): CK ------------------------------------------------------------------------------------------------------------------ No results found for: BNP    ----------------------------------------------------------------------------------------------------------------   Imaging Results:    No results found.    EKG my personal review shows A. fib rate 113 bpm, right bundle branch block.    Assessment & Plan:     1.  Acute hypoxic respiratory failure due to COVID-19 pneumonitis along with acute on chronic diastolic CHF.  She will be admitted to the hospital she will be started on IV steroids and remdesivir, also IV Lasix.  Encouraged her to sit up in chair in the daytime use I-S and flutter valve for pulmonary toiletry and prone at night.  Will monitor her and inflammatory markers closely.  2.  AKI.  Appears to be in fluid overload, IV Lasix and monitor.  We will also check UA, bladder scan and urine electrolytes.  3.  Paroxysmal A. fib.  Currently in RVR.  Mali vas 2 score of greater than 3.  Not on anticoagulation due to fall  risk, placed on beta-blocker, 2 doses of IV digoxin, continue home dose amiodarone.  Monitor on telemetry.  Goal will be rate of 100.  4.  Hypertension.  For now  beta-blocker, once med rec is complete will place on appropriate medications as needed.  5.  Hypothyroidism.  Once med rec is done home dose Synthroid, check TSH.  6.  Acute on chronic diastolic CHF.  Reviewed his recent echo from [place].  EF is around 60%.  Lasix and monitor.  Beta-blocker as needed.  7.  Dyslipidemia.  Home dose statin will be resumed after med rec done.  8.  History of CVA.  Appears to be on dual antiplatelet therapy and statin for secondary prevention.  Resume once med rec done.  9. Recent R hip fracture - PT , OT  10. DM type II.  Check A1c, for now sliding scale and monitor.  May require long-acting insulin also after med rec.     DVT Prophylaxis Heparin    AM Labs Ordered, also please review Full Orders  Family Communication: Admission, patients condition and plan of care including tests being ordered have been discussed with the patient and daughter who indicate understanding and agree with the plan and Code Status.  Code Status DNR  Likely DC to  TBD  Condition GUARDED    Consults called: None    Admission status: Inpt    Time spent in minutes : DNR   Lala Lund M.D on 08/13/2019 at 10:42 AM  To page go to [email or web address] - password [facility]

## 2019-08-13 NOTE — Plan of Care (Signed)
  Problem: Education: Goal: Knowledge of risk factors and measures for prevention of condition will improve Outcome: Progressing   Problem: Coping: Goal: Psychosocial and spiritual needs will be supported Outcome: Progressing   Problem: Respiratory: Goal: Will maintain a patent airway Outcome: Progressing Goal: Complications related to the disease process, condition or treatment will be avoided or minimized Outcome: Progressing   

## 2019-08-13 NOTE — Progress Notes (Signed)
Patient daughter [name] updated.

## 2019-08-13 NOTE — Consult Note (Signed)
Patient transferred from [place] this am. I verified with pharmacist there that patient has not  Received any Remdesivir doses, will Initate therapy. Pt had received Dexatmthasone 6mg  IV on 1/6 and 1/7 at [place]. I adjusted doses at [facility] to daily x8 days for a total of 10 days of therapy. Kathleene Hazel, [place] Staff Pharmacist

## 2019-08-14 LAB — CBC WITH DIFFERENTIAL/PLATELET
Abs Immature Granulocytes: 0.07 10*3/uL (ref 0.00–0.07)
Basophils Absolute: 0 10*3/uL (ref 0.0–0.1)
Basophils Relative: 0 %
Eosinophils Absolute: 0 10*3/uL (ref 0.0–0.5)
Eosinophils Relative: 0 %
HCT: 25.5 % — ABNORMAL LOW (ref 36.0–46.0)
Hemoglobin: 7.9 g/dL — ABNORMAL LOW (ref 12.0–15.0)
Immature Granulocytes: 1 %
Lymphocytes Relative: 3 %
Lymphs Abs: 0.3 10*3/uL — ABNORMAL LOW (ref 0.7–4.0)
MCH: 27.7 pg (ref 26.0–34.0)
MCHC: 31 g/dL (ref 30.0–36.0)
MCV: 89.5 fL (ref 80.0–100.0)
Monocytes Absolute: 0.2 10*3/uL (ref 0.1–1.0)
Monocytes Relative: 1 %
Neutro Abs: 12 10*3/uL — ABNORMAL HIGH (ref 1.7–7.7)
Neutrophils Relative %: 95 %
Platelets: 212 10*3/uL (ref 150–400)
RBC: 2.85 MIL/uL — ABNORMAL LOW (ref 3.87–5.11)
RDW: 15.4 % (ref 11.5–15.5)
WBC: 12.6 10*3/uL — ABNORMAL HIGH (ref 4.0–10.5)
nRBC: 0.2 % (ref 0.0–0.2)

## 2019-08-14 LAB — COMPREHENSIVE METABOLIC PANEL
ALT: 26 U/L (ref 0–44)
AST: 21 U/L (ref 15–41)
Albumin: 1.9 g/dL — ABNORMAL LOW (ref 3.5–5.0)
Alkaline Phosphatase: 68 U/L (ref 38–126)
Anion gap: 7 (ref 5–15)
BUN: 32 mg/dL — ABNORMAL HIGH (ref 8–23)
CO2: 27 mmol/L (ref 22–32)
Calcium: 7.3 mg/dL — ABNORMAL LOW (ref 8.9–10.3)
Chloride: 104 mmol/L (ref 98–111)
Creatinine, Ser: 1.37 mg/dL — ABNORMAL HIGH (ref 0.44–1.00)
GFR calc Af Amer: 41 mL/min — ABNORMAL LOW (ref >60)
GFR calc non Af Amer: 35 mL/min — ABNORMAL LOW (ref >60)
Glucose, Bld: 45 mg/dL — ABNORMAL LOW (ref 70–99)
Potassium: 2.9 mmol/L — ABNORMAL LOW (ref 3.5–5.1)
Sodium: 138 mmol/L (ref 135–145)
Total Bilirubin: 0.5 mg/dL (ref 0.3–1.2)
Total Protein: 4.8 g/dL — ABNORMAL LOW (ref 6.5–8.1)

## 2019-08-14 LAB — GLUCOSE, CAPILLARY
Glucose-Capillary: 49 mg/dL — ABNORMAL LOW (ref 70–99)
Glucose-Capillary: 60 mg/dL — ABNORMAL LOW (ref 70–99)
Glucose-Capillary: 97 mg/dL (ref 70–99)
Glucose-Capillary: 146 mg/dL — ABNORMAL HIGH (ref 70–99)
Glucose-Capillary: 462 mg/dL — ABNORMAL HIGH (ref 70–99)
Glucose-Capillary: 420 mg/dL — ABNORMAL HIGH (ref 70–99)

## 2019-08-14 LAB — MAGNESIUM: Magnesium: 1.9 mg/dL (ref 1.7–2.4)

## 2019-08-14 LAB — POTASSIUM: Potassium: 4.8 mmol/L (ref 3.5–5.1)

## 2019-08-14 LAB — OSMOLALITY, URINE: Osmolality, Ur: 404 mOsm/kg (ref 300–900)

## 2019-08-14 LAB — C-REACTIVE PROTEIN: CRP: 4.6 mg/dL — ABNORMAL HIGH (ref <1.0)

## 2019-08-14 LAB — D-DIMER, QUANTITATIVE: D-Dimer, Quant: 2.56 ug/mL-FEU — ABNORMAL HIGH (ref 0.00–0.50)

## 2019-08-14 LAB — BRAIN NATRIURETIC PEPTIDE: B Natriuretic Peptide: 959.7 pg/mL — ABNORMAL HIGH (ref 0.0–100.0)

## 2019-08-14 MED ORDER — MAGNESIUM SULFATE IN D5W 1-5 GM/100ML-% IV SOLN
1.0000 g | Freq: Once | INTRAVENOUS | Status: AC
  Administered 2019-08-14: 12:00:00 1 g via INTRAVENOUS
  Filled 2019-08-14: qty 100

## 2019-08-14 MED ORDER — METOPROLOL TARTRATE 25 MG PO TABS
100.0000 mg | ORAL_TABLET | Freq: Two times a day (BID) | ORAL | Status: DC
  Administered 2019-08-14 (×2): 100 mg via ORAL
  Filled 2019-08-14 (×3): qty 4

## 2019-08-14 MED ORDER — INSULIN ASPART 100 UNIT/ML ~~LOC~~ SOLN
10.0000 [IU] | Freq: Once | SUBCUTANEOUS | Status: AC
  Administered 2019-08-14: 23:00:00 10 [IU] via SUBCUTANEOUS

## 2019-08-14 MED ORDER — INSULIN ASPART 100 UNIT/ML ~~LOC~~ SOLN
0.0000 [IU] | Freq: Three times a day (TID) | SUBCUTANEOUS | Status: DC
  Administered 2019-08-14: 19:00:00 7 [IU] via SUBCUTANEOUS
  Administered 2019-08-15: 08:00:00 1 [IU] via SUBCUTANEOUS
  Administered 2019-08-15: 12:00:00 7 [IU] via SUBCUTANEOUS

## 2019-08-14 MED ORDER — POTASSIUM CHLORIDE CRYS ER 20 MEQ PO TBCR
40.0000 meq | EXTENDED_RELEASE_TABLET | Freq: Four times a day (QID) | ORAL | Status: AC
  Administered 2019-08-14 (×2): 40 meq via ORAL
  Filled 2019-08-14 (×2): qty 2

## 2019-08-14 MED ORDER — PANTOPRAZOLE SODIUM 40 MG PO TBEC
40.0000 mg | DELAYED_RELEASE_TABLET | Freq: Every day | ORAL | Status: DC
  Administered 2019-08-14 – 2019-08-18 (×5): 40 mg via ORAL
  Filled 2019-08-14 (×5): qty 1

## 2019-08-14 MED ORDER — INSULIN ASPART 100 UNIT/ML ~~LOC~~ SOLN
0.0000 [IU] | Freq: Every day | SUBCUTANEOUS | Status: DC
  Administered 2019-08-15: 21:00:00 3 [IU] via SUBCUTANEOUS

## 2019-08-14 MED ORDER — DEXAMETHASONE SODIUM PHOSPHATE 4 MG/ML IJ SOLN
2.0000 mg | INTRAMUSCULAR | Status: DC
  Administered 2019-08-14: 12:00:00 2 mg via INTRAVENOUS
  Filled 2019-08-14: qty 1

## 2019-08-14 MED ORDER — DEXAMETHASONE SODIUM PHOSPHATE 4 MG/ML IJ SOLN: 4.0000 mg | INTRAMUSCULAR | Status: DC

## 2019-08-14 MED ORDER — POTASSIUM CHLORIDE 10 MEQ/100ML IV SOLN
10.0000 meq | INTRAVENOUS | Status: AC
  Administered 2019-08-14 (×4): 10 meq via INTRAVENOUS
  Filled 2019-08-14 (×4): qty 100

## 2019-08-14 MED ORDER — FUROSEMIDE 20 MG PO TABS
40.0000 mg | ORAL_TABLET | Freq: Once | ORAL | Status: AC
  Administered 2019-08-14: 12:00:00 40 mg via ORAL
  Filled 2019-08-14: qty 2

## 2019-08-14 NOTE — Progress Notes (Signed)
Dr Vanita Ingles returned call for reporting of Bs 420, entering orders at this time

## 2019-08-14 NOTE — Progress Notes (Signed)
PROGRESS NOTE                                                                                                                                                                                                             Patient Demographics:    Amber Ballard, is a 86 y.o. female, DOB - Dec 31, 1931, YBW:[id number]  Outpatient Primary MD for the patient is Ocie Doyne., MD    LOS - 1  Admit date - 08/13/2019    CC - SOB     Brief Narrative -  Amber Ballard  is a 86 y.o. female, with past medical history of paroxysmal atrial fibrillation [place] vas 2 score of 3, not on anticoagulation due to falls, recent left hip fracture with repair about 1 month ago, hypothyroidism, hypertension, dyslipidemia, DM type II, stroke, who was diagnosed with COVID-19 infection about 10 days ago at [place]. She developed some shortness of breath and came to [facility] where she was diagnosed with acute hypoxic respiratory failure along with A. fib RVR and sent to [facility] for further care.      Subjective:    Amber Ballard today has, No headache, No chest pain, No abdominal pain - No Nausea, No new weakness tingling or numbness, improved SOB.   Assessment  & Plan :     1. Acute Hypoxic Resp. Failure due to Acute Covid 19 Viral Pneumonitis during the ongoing 2020 Covid 19 Pandemic - she had moderate to severe disease was started on IV steroids along with remdesivir, is also element of acute on chronic diastolic CHF hence she was diuresed with IV Lasix.  Breathing is much improved on 08/14/2019, will increase activity and titrate down oxygen.  Encouraged the patient to sit up in chair in the daytime use I-S and flutter valve for pulmonary toiletry and then prone in bed when at night.  SpO2: 95 % O2 Flow Rate (L/min): 2.5 L/min  Recent Labs  Lab 08/13/19 1410 08/14/19 0500  CRP 7.1* 4.6*  DDIMER 2.56* 2.56*  BNP 831.0* 959.7*  PROCALCITON 0.48  --      Hepatic Function Latest Ref Rng & Units 08/14/2019 07/31/2019 07/30/2019  Total Protein 6.5 - 8.1 g/dL 4.8(L) 5.4(L) 5.1(L)  Albumin 3.5 - 5.0 g/dL 1.9(L) 2.6(L) 2.5(L)  AST 15 - 41 U/L 21 33 26  ALT 0 -  44 U/L _0 Alk Phosphatase 38 - 126 U/L 68 84 79  Total Bilirubin 0.3 - 1.2 mg/dL 0.5 0.3 0.3    2.  AKI.    Due to fluid overload improving with diuresis which will be continued with caution.  3.  Paroxysmal A. fib.  Currently in RVR.  [place] vas 2 score of greater than 3.  Not on anticoagulation due to fall risk, she was in RVR upon admission received 2 doses of IV digoxin along with home dose amiodarone and high-dose beta-blocker, currently in rate control, continue high-dose beta-blocker and amiodarone.  4.  Hypertension.  For now beta-blocker, once med rec is complete will place on appropriate medications as needed.  5.  Hypothyroidism.  Once med rec is done home dose Synthroid, check TSH.  6.  Acute on chronic diastolic CHF.  Reviewed his recent echo from [place].  EF is around 60%.  Continue diuresis with Lasix and monitor.  Beta-blocker as needed.  7.  Dyslipidemia.  Home dose statin will be resumed after med rec done.  8.  History of CVA.  Appears to be on dual antiplatelet therapy and statin for secondary prevention.  Resume once med rec done.  9. Recent R hip fracture - PT , OT require SNF.  10. Hypokalemia.  Replaced.  Will monitor.    11. DM type II.    Sliding scale insulin, insulin dose adjusted due to hypoglycemia on 08/14/2019.  Acceptable outpatient control considering her age.  Lab Results  Component Value Date   HGBA1C 7.9 (H) 08/13/2019    CBG (last 3)  Recent Labs    08/14/19 0754 08/14/19 0815 08/14/19 0839  GLUCAP 20* 60* 97    Condition - Fair  Family Communication  :  Daughter on the day of admission, son-in-law over the phone on 1/9  Code Status :  DNR  Diet :   Diet Order            Diet Carb Modified Fluid consistency:  Thin; Room service appropriate? Yes  Diet effective now               Disposition Plan  :  TBD  Consults  :  None  Procedures  :  None  PUD Prophylaxis : PPI  DVT Prophylaxis  :    Heparin    Lab Results  Component Value Date   PLT 212 08/14/2019    Inpatient Medications  Scheduled Meds: . amiodarone  200 mg Oral Daily  . aspirin  81 mg Oral Daily  . atorvastatin  80 mg Oral q1800  . clopidogrel  75 mg Oral Daily  . dexamethasone (DECADRON) injection  2 mg Intravenous Q24H  . furosemide  40 mg Oral Once  . heparin  5,000 Units Subcutaneous Q8H  . insulin aspart  0-5 Units Subcutaneous QHS  . insulin aspart  0-9 Units Subcutaneous TID WC  . levothyroxine  75 mcg Oral Q0600  . metoprolol tartrate  100 mg Oral BID  . potassium chloride  40 mEq Oral Q6H  . venlafaxine XR  37.5 mg Oral Daily   Continuous Infusions: . magnesium sulfate bolus IVPB    . potassium chloride 10 mEq (08/14/19 1004)  . remdesivir 100 mg in NS 100 mL 100 mg (08/14/19 0909)   PRN Meds:.acetaminophen, albuterol, bisacodyl, diltiazem, [DISCONTINUED] ondansetron **OR** ondansetron (ZOFRAN) IV, sodium chloride flush  Antibiotics  :    Anti-infectives (From admission, onward)   Start  Dose/Rate Route Frequency Ordered Stop   08/14/19 1000  remdesivir 100 mg in sodium chloride 0.9 % 100 mL IVPB     100 mg 200 mL/hr over 30 Minutes Intravenous Daily 08/13/19 1020 08/18/19 0959   08/13/19 1130  remdesivir 200 mg in sodium chloride 0.9% 250 mL IVPB     200 mg 580 mL/hr over 30 Minutes Intravenous Once 08/13/19 1020 08/14/19 0700       Time Spent in minutes  30   Lala Lund M.D on 08/14/2019 at 11:08 AM  To page go to [email or web address] - password [facility]  [facility] -  Office  [phone]   See all Orders from today for further details    Objective:   Vitals:   08/14/19 0014 08/14/19 0405 08/14/19 0751 08/14/19 0851  BP: 131/79 140/74 135/76 (!) 149/75  Pulse: (!) 101 (!) 118  (!) 114 (!) 103  Resp: _0 Temp: 97.8 F (36.6 C) 97.7 F (36.5 C) (!) 97.5 F (36.4 C)   TempSrc: Oral Oral Oral   SpO2: 94% 91% 95%     Wt Readings from Last 3 Encounters:  07/31/19 65 kg  01/20/19 60.5 kg  10/14/18 62.6 kg     Intake/Output Summary (Last 24 hours) at 08/14/2019 1108 Last data filed at 08/14/2019 0851 Gross per 24 hour  Intake 70 ml  Output 500 ml  Net -430 ml     Physical Exam  Awake Alert,  No new F.N deficits, Normal affect [place].AT,PERRAL Supple Neck,No JVD, No cervical lymphadenopathy appriciated.  Symmetrical Chest wall movement, Good air movement bilaterally, +ve rales RRR,No Gallops,Rubs or new Murmurs, No Parasternal Heave +ve B.Sounds, Abd Soft, No tenderness, No organomegaly appriciated, No rebound - guarding or rigidity. No Cyanosis, Clubbing or edema, No new Rash or bruise     Data Review:    CBC Recent Labs  Lab 08/13/19 1410 08/14/19 0500  WBC 13.7* 12.6*  HGB 8.0* 7.9*  HCT 25.8* 25.5*  PLT 205 212  MCV 88.7 89.5  MCH 27.5 27.7  MCHC 31.0 31.0  RDW 15.5 15.4  LYMPHSABS  --  0.3*  MONOABS  --  0.2  EOSABS  --  0.0  BASOSABS  --  0.0    Chemistries  Recent Labs  Lab 08/13/19 1410 08/14/19 0500  NA  --  138  K  --  2.9*  CL  --  104  CO2  --  27  GLUCOSE  --  45*  BUN  --  32*  CREATININE 1.54* 1.37*  CALCIUM  --  7.3*  MG 1.9 1.9  AST  --  21  ALT  --  26  ALKPHOS  --  68  BILITOT  --  0.5   ------------------------------------------------------------------------------------------------------------------ No results for input(s): CHOL, HDL, LDLCALC, TRIG, CHOLHDL, LDLDIRECT in the last 72 hours.  Lab Results  Component Value Date   HGBA1C 7.9 (H) 08/13/2019   ------------------------------------------------------------------------------------------------------------------ Recent Labs    08/13/19 1410  TSH 0.794    Cardiac Enzymes No results for input(s): CKMB, TROPONINI, MYOGLOBIN in the last  168 hours.  Invalid input(s): CK ------------------------------------------------------------------------------------------------------------------    Component Value Date/Time   BNP 959.7 (H) 08/14/2019 0500    Micro Results No results found for this or any previous visit (from the past 240 hour(s)).  Radiology Reports DG Chest 1 View  Result Date: 07/27/2019 CLINICAL DATA:  Pneumonia EXAM: CHEST  1 VIEW COMPARISON:  07/24/2019 chest radiograph. FINDINGS: Stable cardiomediastinal silhouette  with mild cardiomegaly. No pneumothorax. Stable small bilateral pleural effusions, left greater than right. Patchy apical right and parahilar left mid lung opacities, mildly increased. IMPRESSION: 1. Mildly increased patchy apical right in parahilar left mid lung opacities, compatible with reported pneumonia versus mild pulmonary edema. 2. Small stable bilateral pleural effusions, left greater than right. 3. Mild cardiomegaly. Electronically Signed   By: Ilona Sorrel M.D.   On: 07/27/2019 14:31   US RENAL  Result Date: 07/29/2019 CLINICAL DATA:  Acute kidney injury EXAM: RENAL / URINARY TRACT ULTRASOUND COMPLETE COMPARISON:  Ultrasound 07/26/2019 FINDINGS: Right Kidney: Renal measurements: 10.7 x 4.6 x 3.4 cm = volume: 89.3 mL. Diffusely increased echogenicity with slight loss of the corticomedullary differentiation. No visible renal calculus or hydronephrosis. No worrisome right renal lesion. Left Kidney: Renal measurements: 7.6 x 3.8 x 3.0 cm = volume: 44.7 mL. Echogenicity is within normal limits. No shadowing calculus, hydronephrosis or concerning renal mass. Bladder: Bladder is decompressed by inflated Foley catheter and therefore poorly assessed at the time of imaging. Other: None. IMPRESSION: Increased right renal cortical echogenicity and loss of corticomedullary differentiation compatible with medical renal disease. Left kidney is a more normal appearance. No other concerning renal abnormality.  Bladder decompressed by inflated Foley catheter. Electronically Signed   By: Lovena Le M.D.   On: 07/29/2019 03:52   ECHOCARDIOGRAM COMPLETE  Result Date: 07/28/2019   ECHOCARDIOGRAM REPORT   Patient Name:   Amber Ballard Date of Exam: 07/28/2019 Medical Rec #:  [id number]    Height:       62.0 in Accession #:    [id number]   Weight:       140.0 lb Date of Birth:  1932/05/27    BSA:          1.64 m Patient Age:    89 years     BP:           186/63 mmHg Patient Gender: F            HR:           66 bpm. Exam Location:  Inpatient Procedure: 2D Echo, Cardiac Doppler and Color Doppler Indications:    Atrial fibrillation  History:        Patient has no prior history of Echocardiogram examinations.                 CHF, Stroke, Arrythmias:Atrial Fibrillation; Risk                 Factors:Hypertension and Diabetes. Breast cancer.  Sonographer:    Dustin Flock Referring Phys: [place]  1. Left ventricular ejection fraction, by visual estimation, is 60 to 65%. The left ventricle has normal function. There is no left ventricular hypertrophy.  2. Left ventricular diastolic parameters are consistent with Grade III diastolic dysfunction (restrictive).  3. Global right ventricle has normal systolic function.The right ventricular size is normal.  4. Left atrial size was normal.  5. Right atrial size was normal.  6. Moderate pleural effusion in the left lateral region.  7. Moderate mitral annular calcification.  8. The mitral valve is normal in structure. Mild mitral valve regurgitation. No evidence of mitral stenosis.  9. The tricuspid valve is normal in structure. 10. The aortic valve is tricuspid. Aortic valve regurgitation is not visualized. Mild aortic valve sclerosis without stenosis. 11. The pulmonic valve was normal in structure. Pulmonic valve regurgitation is not visualized. 12. The inferior vena cava is dilated in size  with >50% respiratory variability, suggesting right atrial pressure of 8  mmHg. 13. Normal LV function; restrictive filling; mild MR; left pleural effusion. 14. The left ventricle has no regional wall motion abnormalities. FINDINGS  Left Ventricle: Left ventricular ejection fraction, by visual estimation, is 60 to 65%. The left ventricle has normal function. The left ventricle has no regional wall motion abnormalities. There is no left ventricular hypertrophy. Left ventricular diastolic parameters are consistent with Grade III diastolic dysfunction (restrictive). Normal left atrial pressure. Right Ventricle: The right ventricular size is normal.Global RV systolic function is has normal systolic function. Left Atrium: Left atrial size was normal in size. Right Atrium: Right atrial size was normal in size Pericardium: There is no evidence of pericardial effusion. There is a moderate pleural effusion in the left lateral region. Mitral Valve: The mitral valve is normal in structure. Moderate mitral annular calcification. Mild mitral valve regurgitation. No evidence of mitral valve stenosis by observation. MV peak gradient, 15.7 mmHg. Tricuspid Valve: The tricuspid valve is normal in structure. Tricuspid valve regurgitation is trivial. Aortic Valve: The aortic valve is tricuspid. Aortic valve regurgitation is not visualized. Mild aortic valve sclerosis is present, with no evidence of aortic valve stenosis. Pulmonic Valve: The pulmonic valve was normal in structure. Pulmonic valve regurgitation is not visualized. Pulmonic regurgitation is not visualized. Aorta: The aortic root is normal in size and structure. Venous: The inferior vena cava is dilated in size with greater than 50% respiratory variability, suggesting right atrial pressure of 8 mmHg.  Additional Comments: Normal LV function; restrictive filling; mild MR; left pleural effusion.  LEFT VENTRICLE PLAX 2D LVIDd:         4.70 cm Diastology LVIDs:         2.90 cm LV e' lateral:   7.94 cm/s LV PW:         1.00 cm LV E/e' lateral: 23.6 LV  IVS:        1.10 cm LV e' medial:    7.94 cm/s LV SV:         70 ml   LV E/e' medial:  23.6 LV SV Index:   41.74  RIGHT VENTRICLE RV S prime:     11.10 cm/s LEFT ATRIUM         Index LA diam:    4.20 cm 2.56 cm/m   AORTA Ao Root diam: 2.50 cm MITRAL VALVE MV Area (PHT): 4.21 cm MV Peak grad:  15.7 mmHg MV Mean grad:  4.0 mmHg MV Vmax:       1.98 m/s MV Vmean:      81.1 cm/s MV VTI:        0.44 m MV PHT:        52.20 msec MV Decel Time: 180 msec MV E velocity: 187.00 cm/s 103 cm/s MV A velocity: 62.20 cm/s  70.3 cm/s MV E/A ratio:  3.01        1.5  Kirk Ruths MD Electronically signed by Kirk Ruths MD Signature Date/Time: 07/28/2019/12:10:49 PM    Final    [place] EKG SITE RITE  Result Date: 08/13/2019 If Site Rite image not attached, placement could not be confirmed due to current cardiac rhythm.

## 2019-08-14 NOTE — Progress Notes (Addendum)
Patient is a/ox4 and is currently on 2L [place] oxygen saturations maintaining between 89-93%. She was on 3L [place] during shift change maintaining oxygen saturations 94-99%. Also, she is still in Afib, her heart rate peaked at 128, I administered 67mLs of cardizem. Her heart rate is currently 93. She has c/o being hot, however her oral temperatures have ranged between 97.7-97.8.  Her total foley output is 500cc's, urine is yellow and clear with no foul odor.

## 2019-08-14 NOTE — Progress Notes (Signed)
Physical Therapy Evaluation Patient Details Name: Amber Ballard MRN: [id number] DOB: [birth year] Today's Date: 08/14/2019   History of Present Illness  86 y.o. female, with past medical history of paroxysmal atrial fibrillation [place] vas 2 score of 3, not on anticoagulation due to falls, recent left hip fracture with repair about 1 month ago, hypothyroidism, hypertension, dyslipidemia, DM type II, stroke, who was diagnosed with COVID-19 infection about 10 days ago at [place].  She developed some shortness of breath and came to [facility] ER where she was diagnosed with acute hypoxic respiratory failure and sent to [facility] for further care.  She was also found to have A. fib RVR, AKI during her stay at [facility].  Clinical Impression   Pt admitted with above dx, she was recently seen at this hospital and dc home with family. This am pt is quite fatigued but she is agreeable to assessment, she was able to complete most tasks given with mod assist, she was able to get to edge of bed with mod a but noted 1 loss of balance backwards but once resituated was able to maintain sitting w/o UE support. Sit<>stand also with mod a and RW, pt again had some retropulsion and was unable to self correct without mod a. Ambulation in room approx 54ft with RW also with mod a. Pt on @L /min via [place] and sats in 90s throughout session but noted HR increased to 120s with any mobility (bed mob, transfers, or ambulation), RR in 20s during activities. Reinforced use of incentive spirometer and pt able to pull 763ml max, also reinforced use of flutter valve. Pt will benefit from continued acute care level skilled PT intervention while in hopsital to address deficits in balance, coordination, activity tolerance, independence and safety in order to be able to safely dc back home with home health.     Follow Up Recommendations Home health PT;Supervision/Assistance - 24 hour    Equipment Recommendations  None recommended by PT     Recommendations for Other Services OT consult     Precautions / Restrictions Precautions Precautions: Fall Precaution Comments: recent fall with hip fx Restrictions Weight Bearing Restrictions: No      Mobility  Bed Mobility Overal bed mobility: Needs Assistance Bed Mobility: Supine to Sit     Supine to sit: Mod assist;Min assist        Transfers Overall transfer level: Needs assistance Equipment used: Rolling walker (2 wheeled) Transfers: Sit to/from [facility] Sit to Stand: Mod assist Stand pivot transfers: Mod assist          Ambulation/Gait Ambulation/Gait assistance: Mod assist Gait Distance (Feet): 32 Feet Assistive device: Rolling walker (2 wheeled) Gait Pattern/deviations: Step-to pattern Gait velocity: Decreased   General Gait Details: pt retropulsive today with standing and also ambulation  Stairs            Wheelchair Mobility    Modified Rankin (Stroke Patients Only)       Balance Overall balance assessment: Needs assistance Sitting-balance support: Feet supported;Bilateral upper extremity supported Sitting balance-Leahy Scale: Poor Sitting balance - Comments: 1 lob backwards sitting eob   Standing balance support: During functional activity;Bilateral upper extremity supported Standing balance-Leahy Scale: Poor Standing balance comment: retropulsive needs mod a to maintain upright                             Pertinent Vitals/Pain Pain Assessment: No/denies pain    Home Living Family/patient expects to be discharged  to:: Private residence Living Arrangements: Children Available Help at Discharge: Available 24 hours/day Type of Home: House Home Access: Stairs to enter   [employer] of Steps: states a few Home Layout: One level Home Equipment: Environmental consultant - 2 wheels;Cane - single point;Bedside commode;Shower seat;Wheelchair - manual;Hospital bed      Prior Function Level of Independence:  Independent with assistive device(s)               Hand Dominance   Dominant Hand: Right    Extremity/Trunk Assessment   Upper Extremity Assessment Upper Extremity Assessment: Generalized weakness    Lower Extremity Assessment Lower Extremity Assessment: Generalized weakness    Cervical / Trunk Assessment Cervical / Trunk Assessment: Kyphotic  Communication   Communication: No difficulties  Cognition Arousal/Alertness: Awake/alert Behavior During Therapy: WFL for tasks assessed/performed Overall Cognitive Status: No family/caregiver present to determine baseline cognitive functioning                                        General Comments      Exercises Other Exercises Other Exercises: incentive spirometer x5 pulls max 74ml Other Exercises: flutter valve x5   Assessment/Plan    PT Assessment Patient needs continued PT services  PT Problem List Decreased strength;Decreased activity tolerance;Decreased balance;Decreased coordination;Decreased mobility;Decreased safety awareness       PT Treatment Interventions DME instruction;Gait training;Functional mobility training;Therapeutic activities;Therapeutic exercise;Balance training;Patient/family education    PT Goals (Current goals can be found in the Care Plan section)  Acute Rehab PT Goals PT Goal Formulation: With patient Time For Goal Achievement: 08/28/19 Potential to Achieve Goals: Fair    Frequency Min 3X/week   Barriers to discharge        Co-evaluation               AM-PAC PT "6 Clicks" Mobility  Outcome Measure Help needed turning from your back to your side while in a flat bed without using bedrails?: A Lot Help needed moving from lying on your back to sitting on the side of a flat bed without using bedrails?: A Lot Help needed moving to and from a bed to a chair (including a wheelchair)?: A Lot Help needed standing up from a chair using your arms (e.g., wheelchair or  bedside chair)?: A Lot Help needed to walk in hospital room?: A Lot Help needed climbing 3-5 steps with a railing? : Total 6 Click Score: 11    End of Session Equipment Utilized During Treatment: Oxygen Activity Tolerance: Treatment limited secondary to medical complications (Comment);Patient limited by fatigue Patient left: in chair;with call bell/phone within reach Nurse Communication: Mobility status PT Visit Diagnosis: Unsteadiness on feet (R26.81);Muscle weakness (generalized) (M62.81);History of falling (Z91.81)    Time: 8299-3716 PT Time Calculation (min) (ACUTE ONLY): 34 min   Charges:   PT Evaluation $PT Eval Moderate Complexity: 1 Mod PT Treatments $Therapeutic Activity: 8-22 mins        Horald Chestnut, PT   Delford Field 08/14/2019, 12:59 PM

## 2019-08-14 NOTE — Plan of Care (Signed)
Updated pt on POC, verbalized understanding

## 2019-08-14 NOTE — Progress Notes (Signed)
Notified Dr Vanita Ingles of BS 420. Protocol states to noitify MD if over 400, will carry out any new orders

## 2019-08-15 LAB — COMPREHENSIVE METABOLIC PANEL
ALT: 27 U/L (ref 0–44)
AST: 23 U/L (ref 15–41)
Albumin: 2.2 g/dL — ABNORMAL LOW (ref 3.5–5.0)
Alkaline Phosphatase: 75 U/L (ref 38–126)
Anion gap: 9 (ref 5–15)
BUN: 36 mg/dL — ABNORMAL HIGH (ref 8–23)
CO2: 28 mmol/L (ref 22–32)
Calcium: 7.4 mg/dL — ABNORMAL LOW (ref 8.9–10.3)
Chloride: 100 mmol/L (ref 98–111)
Creatinine, Ser: 1.43 mg/dL — ABNORMAL HIGH (ref 0.44–1.00)
GFR calc Af Amer: 39 mL/min — ABNORMAL LOW (ref >60)
GFR calc non Af Amer: 33 mL/min — ABNORMAL LOW (ref >60)
Glucose, Bld: 232 mg/dL — ABNORMAL HIGH (ref 70–99)
Potassium: 4.4 mmol/L (ref 3.5–5.1)
Sodium: 137 mmol/L (ref 135–145)
Total Bilirubin: 0.6 mg/dL (ref 0.3–1.2)
Total Protein: 5.2 g/dL — ABNORMAL LOW (ref 6.5–8.1)

## 2019-08-15 LAB — CBC WITH DIFFERENTIAL/PLATELET
Abs Immature Granulocytes: 0.03 10*3/uL (ref 0.00–0.07)
Basophils Absolute: 0 10*3/uL (ref 0.0–0.1)
Basophils Relative: 0 %
Eosinophils Absolute: 0 10*3/uL (ref 0.0–0.5)
Eosinophils Relative: 0 %
HCT: 27.3 % — ABNORMAL LOW (ref 36.0–46.0)
Hemoglobin: 8.4 g/dL — ABNORMAL LOW (ref 12.0–15.0)
Immature Granulocytes: 0 %
Lymphocytes Relative: 5 %
Lymphs Abs: 0.5 10*3/uL — ABNORMAL LOW (ref 0.7–4.0)
MCH: 27.7 pg (ref 26.0–34.0)
MCHC: 30.8 g/dL (ref 30.0–36.0)
MCV: 90.1 fL (ref 80.0–100.0)
Monocytes Absolute: 0.2 10*3/uL (ref 0.1–1.0)
Monocytes Relative: 2 %
Neutro Abs: 9.2 10*3/uL — ABNORMAL HIGH (ref 1.7–7.7)
Neutrophils Relative %: 93 %
Platelets: 264 10*3/uL (ref 150–400)
RBC: 3.03 MIL/uL — ABNORMAL LOW (ref 3.87–5.11)
RDW: 15.4 % (ref 11.5–15.5)
WBC: 10 10*3/uL (ref 4.0–10.5)
nRBC: 0.3 % — ABNORMAL HIGH (ref 0.0–0.2)

## 2019-08-15 LAB — GLUCOSE, CAPILLARY
Glucose-Capillary: 150 mg/dL — ABNORMAL HIGH (ref 70–99)
Glucose-Capillary: 216 mg/dL — ABNORMAL HIGH (ref 70–99)
Glucose-Capillary: 282 mg/dL — ABNORMAL HIGH (ref 70–99)
Glucose-Capillary: 303 mg/dL — ABNORMAL HIGH (ref 70–99)
Glucose-Capillary: 200 mg/dL — ABNORMAL HIGH (ref 70–99)
Glucose-Capillary: 259 mg/dL — ABNORMAL HIGH (ref 70–99)

## 2019-08-15 LAB — BRAIN NATRIURETIC PEPTIDE: B Natriuretic Peptide: 855.4 pg/mL — ABNORMAL HIGH (ref 0.0–100.0)

## 2019-08-15 LAB — MAGNESIUM: Magnesium: 1.9 mg/dL (ref 1.7–2.4)

## 2019-08-15 LAB — C-REACTIVE PROTEIN: CRP: 2.6 mg/dL — ABNORMAL HIGH (ref <1.0)

## 2019-08-15 LAB — D-DIMER, QUANTITATIVE: D-Dimer, Quant: 4.87 ug/mL-FEU — ABNORMAL HIGH (ref 0.00–0.50)

## 2019-08-15 MED ORDER — INSULIN ASPART 100 UNIT/ML ~~LOC~~ SOLN
0.0000 [IU] | Freq: Three times a day (TID) | SUBCUTANEOUS | Status: DC
  Administered 2019-08-15: 18:00:00 3 [IU] via SUBCUTANEOUS
  Administered 2019-08-16 (×2): 8 [IU] via SUBCUTANEOUS
  Administered 2019-08-16 – 2019-08-17 (×2): 3 [IU] via SUBCUTANEOUS
  Administered 2019-08-17: 08:00:00 15 [IU] via SUBCUTANEOUS
  Administered 2019-08-17: 12:00:00 8 [IU] via SUBCUTANEOUS
  Administered 2019-08-18: 09:00:00 11 [IU] via SUBCUTANEOUS
  Administered 2019-08-18: 13:00:00 5 [IU] via SUBCUTANEOUS

## 2019-08-15 MED ORDER — METOPROLOL TARTRATE 50 MG PO TABS
150.0000 mg | ORAL_TABLET | Freq: Two times a day (BID) | ORAL | Status: DC
  Administered 2019-08-15 (×2): 150 mg via ORAL
  Filled 2019-08-15: qty 6
  Filled 2019-08-15 (×3): qty 3

## 2019-08-15 NOTE — Plan of Care (Signed)
  Problem: Education: Goal: Knowledge of risk factors and measures for prevention of condition will improve Outcome: Progressing   Problem: Coping: Goal: Psychosocial and spiritual needs will be supported Outcome: Progressing   Problem: Respiratory: Goal: Will maintain a patent airway Outcome: Progressing Goal: Complications related to the disease process, condition or treatment will be avoided or minimized Outcome: Progressing   Problem: Education: Goal: Knowledge of General Education information will improve Description: Including pain rating scale, medication(s)/side effects and non-pharmacologic comfort measures Outcome: Progressing   Problem: Health Behavior/Discharge Planning: Goal: Ability to manage health-related needs will improve Outcome: Progressing   Problem: Nutrition: Goal: Adequate nutrition will be maintained Outcome: Progressing   Problem: Coping: Goal: Level of anxiety will decrease Outcome: Progressing   Problem: Elimination: Goal: Will not experience complications related to bowel motility Outcome: Progressing Goal: Will not experience complications related to urinary retention Outcome: Progressing

## 2019-08-15 NOTE — Progress Notes (Signed)
Occupational Therapy Evaluation Patient Details Name: Amber Ballard MRN: [id number] DOB: Feb 17, 1932 Today's Date: 08/15/2019    History of Present Illness 86 y.o. female, with past medical history of paroxysmal atrial fibrillation [place] vas 2 score of 3, not on anticoagulation due to falls, recent left hip fracture with repair about 1 month ago, hypothyroidism, hypertension, dyslipidemia, DM type II, stroke, who was diagnosed with COVID-19 infection about 10 days ago at [place].  She developed some shortness of breath and came to [facility] ER where she was diagnosed with acute hypoxic respiratory failure and sent to [facility] for further care.  She was also found to have A. fib RVR, AKI during her stay at [facility] ER.   Clinical Impression   Pt unsure if she came from home or from a rehab center prior to arrival. Attempted to call pt's daughter to ensure accuracy of reported PLOF, however daughter did not answer. Per chart, pt came from home. Pt reports that she was independent in all ADL and mobility tasks, ambulating with a 4WW. Pt reports that she does not use oxygen at home and is currently on 2L [place]. Pt currently independent to mod assist for self-care and functional transfer tasks. Pt tolerated standing 1 x 1.5 min and 1 x 2.5 min with min guard while completing grooming/hygiene tasks. Pt initially required min assist for sit/stand transfers, progressing to mod assist towards end of session. Pt required mod assist to transfer to [facility] with RW. SpO2 maintained in 90s throughout with no reports of shortness of breath. Pt demonstrates decreased strength, endurance, balance, standing tolerance, activity tolerance, and safety awareness impacting ability to complete self-care and functional transfer tasks. Recommend skilled OT services to address above deficits in order to promote function and prevent further decline. If family is unable to provide 24/7 assist, pt will require SNF placement for additional rehab.      Follow Up Recommendations  Home health OT;Supervision/Assistance - 24 hour(SNF if family is able to provide 24/7 assist.)    Equipment Recommendations  None recommended by OT    Recommendations for Other Services       Precautions / Restrictions Precautions Precautions: Fall Precaution Comments: recent fall with right hip fx s/p surgery Restrictions Weight Bearing Restrictions: No      Mobility Bed Mobility               General bed mobility comments: Pt seated in bedside chair upon OT arrival.  Transfers Overall transfer level: Needs assistance Equipment used: Rolling walker (2 wheeled) Transfers: Sit to/from [facility] Sit to Stand: Min assist;Mod assist Stand pivot transfers: Min assist;Mod assist       General transfer comment: Pt initially min assist for transfers, progressing to mod assist for balance and support with RW.    Balance Overall balance assessment: Needs assistance Sitting-balance support: Feet supported Sitting balance-Leahy Scale: Good       Standing balance-Leahy Scale: Poor                             ADL either performed or assessed with clinical judgement   ADL Overall ADL's : Needs assistance/impaired Eating/Feeding: Independent;Sitting   Grooming: Min guard;Standing   Upper Body Bathing: Minimal assistance;Sitting   Lower Body Bathing: Moderate assistance;Sit to/from stand;Sitting/lateral leans   Upper Body Dressing : Minimal assistance;Sitting   Lower Body Dressing: Moderate assistance;Sit to/from stand;Sitting/lateral leans   Toilet Transfer: Minimal assistance;Moderate assistance;BSC   Toileting- Clothing Manipulation  and Hygiene: Moderate assistance;Sit to/from stand;Sitting/lateral lean       Functional mobility during ADLs: Minimal assistance;Moderate assistance;Rolling walker General ADL Comments: Pt able to transfer to/from [facility] with RW and variable min to mod assist.     Vision          Perception     Praxis      Pertinent Vitals/Pain Pain Assessment: No/denies pain     Hand Dominance Right   Extremity/Trunk Assessment Upper Extremity Assessment Upper Extremity Assessment: Generalized weakness   Lower Extremity Assessment Lower Extremity Assessment: Defer to PT evaluation       Communication Communication Communication: HOH   Cognition Arousal/Alertness: Awake/alert Behavior During Therapy: WFL for tasks assessed/performed Overall Cognitive Status: No family/caregiver present to determine baseline cognitive functioning                                     General Comments  Pt on 2L [place] with SpO2 100% at rest. Pt completed all activities on room air with SpO2 maintaining in 90s throughout. HR flucturated between 118 - 135.     Exercises Exercises: Other exercises Other Exercises Other Exercises: Incentive spirometer x 10. Pulling 360mL Other Exercises: Flutter valve x 10   Shoulder Instructions      Home Living Family/patient expects to be discharged to:: Private residence Living Arrangements: Children(Daughter and son in law) Available Help at Discharge: Available 24 hours/day Type of Home: House Home Access: Stairs to enter [employer] of Steps: states a few Entrance Stairs-Rails: None Home Layout: One level     Bathroom Shower/Tub: Occupational psychologist: Standard     Home Equipment: Environmental consultant - 2 wheels;Cane - single point;Bedside commode;Shower seat;Wheelchair - manual;Hospital bed   Additional Comments: Attempted to call daughter to ensure accuracy of PLOF with no answer.      Prior Functioning/Environment Level of Independence: Independent with assistive device(s)        Comments: Pt independent with ADLs and mobility. Pt reports that daughter completes IADLs. Pt no longer drives. Pt reports 1 fall in the last 6 months. Pt does not wear oxygen at home. Pt ambulates with 4WW around house.         OT Problem List: Decreased strength;Decreased activity tolerance;Impaired balance (sitting and/or standing);Decreased safety awareness;Decreased knowledge of use of DME or AE;Cardiopulmonary status limiting activity      OT Treatment/Interventions: Self-care/ADL training;Therapeutic exercise;Neuromuscular education;Energy conservation;DME and/or AE instruction;Therapeutic activities;Patient/family education;Balance training    OT Goals(Current goals can be found in the care plan section) Acute Rehab OT Goals Patient Stated Goal: to go home Time For Goal Achievement: 08/29/19 Potential to Achieve Goals: Good ADL Goals Pt Will Perform Grooming: with supervision;standing Pt Will Perform Lower Body Bathing: with supervision;sit to/from stand Pt Will Perform Lower Body Dressing: with supervision;sit to/from stand Pt Will Transfer to Toilet: with supervision;ambulating Pt Will Perform Toileting - Clothing Manipulation and hygiene: with supervision;sit to/from stand Additional ADL Goal #1: Pt to tolerate standing up to 5 min with supervision, in preparation for ADLs. Additional ADL Goal #2: Pt to recall and verbalize 3 fall prevention strategies with 0 verbal cues.  OT Frequency: Min 3X/week   Barriers to D/C:            Co-evaluation              AM-PAC OT "6 Clicks" Daily Activity     Outcome Measure  Help from another person eating meals?: None Help from another person taking care of personal grooming?: A Little Help from another person toileting, which includes using toliet, bedpan, or urinal?: A Lot Help from another person bathing (including washing, rinsing, drying)?: A Lot Help from another person to put on and taking off regular upper body clothing?: A Little Help from another person to put on and taking off regular lower body clothing?: A Lot 6 Click Score: 16   End of Session Equipment Utilized During Treatment: Rolling walker;Oxygen Nurse Communication:  Mobility status  Activity Tolerance: Patient limited by fatigue Patient left: in chair;with call bell/phone within reach;with chair alarm set  OT Visit Diagnosis: Unsteadiness on feet (R26.81);Muscle weakness (generalized) (M62.81)                Time: 3552-1747 OT Time Calculation (min): 34 min Charges:  OT General Charges $OT Visit: 1 Visit OT Evaluation $OT Eval Moderate Complexity: 1 Mod OT Treatments $Self Care/Home Management : 8-22 mins  Mauri Brooklyn OTR/L [phone]   Mauri Brooklyn 08/15/2019, 1:38 PM

## 2019-08-15 NOTE — Progress Notes (Signed)
PROGRESS NOTE                                                                                                                                                                                                             Patient Demographics:    Amber Ballard, is a 86 y.o. female, DOB - [birth year], ZOX:[id number]  Outpatient Primary MD for the patient is Ocie Doyne., MD    LOS - 2  Admit date - 08/13/2019    CC - SOB     Brief Narrative -  Amber Ballard  is a 86 y.o. female, with past medical history of paroxysmal atrial fibrillation [place] vas 2 score of 3, not on anticoagulation due to falls, recent left hip fracture with repair about 1 month ago, hypothyroidism, hypertension, dyslipidemia, DM type II, stroke, who was diagnosed with COVID-19 infection about 10 days ago at [place]. She developed some shortness of breath and came to [facility] ER where she was diagnosed with acute hypoxic respiratory failure along with A. fib RVR and sent to [facility] for further care.      Subjective:   Patient in bed, appears comfortable, denies any headache, no fever, no chest pain or pressure, no shortness of breath , no abdominal pain. No focal weakness.    Assessment  & Plan :     1. Acute Hypoxic Resp. Failure due to Acute Covid 19 Viral Pneumonitis during the ongoing 2020 Covid 19 Pandemic - she had moderate to severe disease was started on IV steroids along with remdesivir, is also element of acute on chronic diastolic CHF hence she was diuresed with IV Lasix repeat 08/15/19.  Breathing is much improved on 08/14/2019, will increase activity and titrate down oxygen.  Encouraged the patient to sit up in chair in the daytime use I-S and flutter valve for pulmonary toiletry and then prone in bed when at night.  SpO2: 100 % O2 Flow Rate (L/min): 2 L/min  Recent Labs  Lab 08/13/19 1410 08/14/19 0500  CRP 7.1* 4.6*  DDIMER 2.56* 2.56*  BNP 831.0*  959.7*  PROCALCITON 0.48  --     Hepatic Function Latest Ref Rng & Units 08/14/2019 07/31/2019 07/30/2019  Total Protein 6.5 - 8.1 g/dL 4.8(L) 5.4(L) 5.1(L)  Albumin 3.5 - 5.0 g/dL 1.9(L) 2.6(L) 2.5(L)  AST 15 - 41 U/L 21 33  26  ALT 0 - 44 U/L '26 30 20  ' Alk Phosphatase 38 - 126 U/L 68 84 79  Total Bilirubin 0.3 - 1.2 mg/dL 0.5 0.3 0.3    2.  AKI.    Due to fluid overload improving with diuresis which will be continued with caution.  3.  Paroxysmal A. fib.  Currently in RVR.  Mali vas 2 score of greater than 3.  Not on anticoagulation due to fall risk, she was in RVR upon admission received 2 doses of IV digoxin along with home dose amiodarone and high-dose beta-blocker, currently in rate control, continue high-dose beta-blocker dose increased further on 08/15/2019 for better control along with home dose Amiodarone.  4.  Hypertension.  For now beta-blocker, once med rec is complete will place on appropriate medications as needed.  5.  Hypothyroidism.  Once med rec is done home dose Synthroid, stable TSH.  6.  Acute on chronic diastolic CHF.  Reviewed his recent echo from [place].  EF is around 60%.  Continue diuresis with IV Lasix and monitor.  Beta-blocker as needed.  7.  Dyslipidemia.  Home dose statin will be resumed after med rec done.  8.  History of CVA.  Appears to be on dual antiplatelet therapy and statin for secondary prevention.  Resume once med rec done.  9. Recent R hip fracture - PT , OT require SNF.  10. Hypokalemia.  Replaced.  Will monitor.    11. DM type II.    Sliding scale insulin, insulin dose adjusted due to hypoglycemia on 08/14/2019.  Acceptable outpatient control considering her age.  Lab Results  Component Value Date   HGBA1C 7.9 (H) 08/13/2019    CBG (last 3)  Recent Labs    08/14/19 2118 08/15/19 0923 08/15/19 0951  GLUCAP 420* 216* 282*    Condition - Fair  Family Communication  :  Daughter on the day of admission, son-in-law over the  phone on 1/9  Code Status :  DNR  Diet :   Diet Order            Diet Carb Modified Fluid consistency: Thin; Room service appropriate? Yes  Diet effective now               Disposition Plan  :  Home 1-2 days  Consults  :  None  Procedures  :  None  PUD Prophylaxis : PPI  DVT Prophylaxis  :    Heparin    Lab Results  Component Value Date   PLT 212 08/14/2019    Inpatient Medications  Scheduled Meds:  amiodarone  200 mg Oral Daily   aspirin  81 mg Oral Daily   atorvastatin  80 mg Oral q1800   clopidogrel  75 mg Oral Daily   heparin  5,000 Units Subcutaneous Q8H   insulin aspart  0-5 Units Subcutaneous QHS   insulin aspart  0-9 Units Subcutaneous TID WC   levothyroxine  75 mcg Oral Q0600   metoprolol tartrate  150 mg Oral BID   pantoprazole  40 mg Oral Daily   venlafaxine XR  37.5 mg Oral Daily   Continuous Infusions:  remdesivir 100 mg in NS 100 mL Stopped (08/14/19 0939)   PRN Meds:.acetaminophen, albuterol, bisacodyl, diltiazem, [DISCONTINUED] ondansetron **OR** ondansetron (ZOFRAN) IV, sodium chloride flush  Antibiotics  :    Anti-infectives (From admission, onward)   Start     Dose/Rate Route Frequency Ordered Stop   08/14/19 1000  remdesivir 100 mg in sodium chloride  0.9 % 100 mL IVPB     100 mg 200 mL/hr over 30 Minutes Intravenous Daily 08/13/19 1020 08/18/19 0959   08/13/19 1130  remdesivir 200 mg in sodium chloride 0.9% 250 mL IVPB     200 mg 580 mL/hr over 30 Minutes Intravenous Once 08/13/19 1020 08/14/19 0700       Time Spent in minutes  30   Lala Lund M.D on 08/15/2019 at 10:05 AM  To page go to [email or web address] - password [place]  [facility] -  Office  [phone]   See all Orders from today for further details    Objective:   Vitals:   08/14/19 2115 08/15/19 0000 08/15/19 0400 08/15/19 0800  BP: 106/74 139/84 (!) 150/93 (!) 155/83  Pulse: (!) 104 (!) 104 (!) 103 91  Resp:  '13 12 18  ' Temp:  98.3 F (36.8  C) (!) 97.2 F (36.2 C) 97.9 F (36.6 C)  TempSrc:  Oral Axillary Oral  SpO2:  99% 99% 100%    Wt Readings from Last 3 Encounters:  07/31/19 65 kg  01/20/19 60.5 kg  10/14/18 62.6 kg     Intake/Output Summary (Last 24 hours) at 08/15/2019 1005 Last data filed at 08/15/2019 0420 Gross per 24 hour  Intake 498 ml  Output 1615 ml  Net -1117 ml     Physical Exam  Awake Alert,  No new F.N deficits, Normal affect [place].AT,PERRAL Supple Neck,No JVD, No cervical lymphadenopathy appriciated.  Symmetrical Chest wall movement, Good air movement bilaterally, few rales RRR,No Gallops, Rubs or new Murmurs, No Parasternal Heave +ve B.Sounds, Abd Soft, No tenderness, No organomegaly appriciated, No rebound - guarding or rigidity. No Cyanosis, Clubbing or edema, No new Rash or bruise     Data Review:    CBC Recent Labs  Lab 08/13/19 1410 08/14/19 0500  WBC 13.7* 12.6*  HGB 8.0* 7.9*  HCT 25.8* 25.5*  PLT 205 212  MCV 88.7 89.5  MCH 27.5 27.7  MCHC 31.0 31.0  RDW 15.5 15.4  LYMPHSABS  --  0.3*  MONOABS  --  0.2  EOSABS  --  0.0  BASOSABS  --  0.0    Chemistries  Recent Labs  Lab 08/13/19 1410 08/14/19 0500 08/14/19 1548  NA  --  138  --   K  --  2.9* 4.8  CL  --  104  --   CO2  --  27  --   GLUCOSE  --  45*  --   BUN  --  32*  --   CREATININE 1.54* 1.37*  --   CALCIUM  --  7.3*  --   MG 1.9 1.9  --   AST  --  21  --   ALT  --  26  --   ALKPHOS  --  68  --   BILITOT  --  0.5  --    ------------------------------------------------------------------------------------------------------------------ No results for input(s): CHOL, HDL, LDLCALC, TRIG, CHOLHDL, LDLDIRECT in the last 72 hours.  Lab Results  Component Value Date   HGBA1C 7.9 (H) 08/13/2019   ------------------------------------------------------------------------------------------------------------------ Recent Labs    08/13/19 1410  TSH 0.794    Cardiac Enzymes No results for input(s): CKMB,  TROPONINI, MYOGLOBIN in the last 168 hours.  Invalid input(s): CK ------------------------------------------------------------------------------------------------------------------    Component Value Date/Time   BNP 959.7 (H) 08/14/2019 0500    Micro Results No results found for this or any previous visit (from the past 240 hour(s)).  Radiology Reports DG Chest  1 View  Result Date: 07/27/2019 CLINICAL DATA:  Pneumonia EXAM: CHEST  1 VIEW COMPARISON:  07/24/2019 chest radiograph. FINDINGS: Stable cardiomediastinal silhouette with mild cardiomegaly. No pneumothorax. Stable small bilateral pleural effusions, left greater than right. Patchy apical right and parahilar left mid lung opacities, mildly increased. IMPRESSION: 1. Mildly increased patchy apical right in parahilar left mid lung opacities, compatible with reported pneumonia versus mild pulmonary edema. 2. Small stable bilateral pleural effusions, left greater than right. 3. Mild cardiomegaly. Electronically Signed   By: Ilona Sorrel M.D.   On: 07/27/2019 14:31   US RENAL  Result Date: 07/29/2019 CLINICAL DATA:  Acute kidney injury EXAM: RENAL / URINARY TRACT ULTRASOUND COMPLETE COMPARISON:  Ultrasound 07/26/2019 FINDINGS: Right Kidney: Renal measurements: 10.7 x 4.6 x 3.4 cm = volume: 89.3 mL. Diffusely increased echogenicity with slight loss of the corticomedullary differentiation. No visible renal calculus or hydronephrosis. No worrisome right renal lesion. Left Kidney: Renal measurements: 7.6 x 3.8 x 3.0 cm = volume: 44.7 mL. Echogenicity is within normal limits. No shadowing calculus, hydronephrosis or concerning renal mass. Bladder: Bladder is decompressed by inflated Foley catheter and therefore poorly assessed at the time of imaging. Other: None. IMPRESSION: Increased right renal cortical echogenicity and loss of corticomedullary differentiation compatible with medical renal disease. Left kidney is a more normal appearance. No other  concerning renal abnormality. Bladder decompressed by inflated Foley catheter. Electronically Signed   By: Lovena Le M.D.   On: 07/29/2019 03:52   ECHOCARDIOGRAM COMPLETE  Result Date: 07/28/2019   ECHOCARDIOGRAM REPORT   Patient Name:   Amber Ballard Date of Exam: 07/28/2019 Medical Rec #:  [id number]    Height:       62.0 in Accession #:    [id number]   Weight:       140.0 lb Date of Birth:  11-Apr-1932    BSA:          1.64 m Patient Age:    41 years     BP:           186/63 mmHg Patient Gender: F            HR:           66 bpm. Exam Location:  Inpatient Procedure: 2D Echo, Cardiac Doppler and Color Doppler Indications:    Atrial fibrillation  History:        Patient has no prior history of Echocardiogram examinations.                 CHF, Stroke, Arrythmias:Atrial Fibrillation; Risk                 Factors:Hypertension and Diabetes. Breast cancer.  Sonographer:    Dustin Flock Referring Phys: [place]  1. Left ventricular ejection fraction, by visual estimation, is 60 to 65%. The left ventricle has normal function. There is no left ventricular hypertrophy.  2. Left ventricular diastolic parameters are consistent with Grade III diastolic dysfunction (restrictive).  3. Global right ventricle has normal systolic function.The right ventricular size is normal.  4. Left atrial size was normal.  5. Right atrial size was normal.  6. Moderate pleural effusion in the left lateral region.  7. Moderate mitral annular calcification.  8. The mitral valve is normal in structure. Mild mitral valve regurgitation. No evidence of mitral stenosis.  9. The tricuspid valve is normal in structure. 10. The aortic valve is tricuspid. Aortic valve regurgitation is not visualized. Mild aortic valve sclerosis without  stenosis. 11. The pulmonic valve was normal in structure. Pulmonic valve regurgitation is not visualized. 12. The inferior vena cava is dilated in size with >50% respiratory variability,  suggesting right atrial pressure of 8 mmHg. 13. Normal LV function; restrictive filling; mild MR; left pleural effusion. 14. The left ventricle has no regional wall motion abnormalities. FINDINGS  Left Ventricle: Left ventricular ejection fraction, by visual estimation, is 60 to 65%. The left ventricle has normal function. The left ventricle has no regional wall motion abnormalities. There is no left ventricular hypertrophy. Left ventricular diastolic parameters are consistent with Grade III diastolic dysfunction (restrictive). Normal left atrial pressure. Right Ventricle: The right ventricular size is normal.Global RV systolic function is has normal systolic function. Left Atrium: Left atrial size was normal in size. Right Atrium: Right atrial size was normal in size Pericardium: There is no evidence of pericardial effusion. There is a moderate pleural effusion in the left lateral region. Mitral Valve: The mitral valve is normal in structure. Moderate mitral annular calcification. Mild mitral valve regurgitation. No evidence of mitral valve stenosis by observation. MV peak gradient, 15.7 mmHg. Tricuspid Valve: The tricuspid valve is normal in structure. Tricuspid valve regurgitation is trivial. Aortic Valve: The aortic valve is tricuspid. Aortic valve regurgitation is not visualized. Mild aortic valve sclerosis is present, with no evidence of aortic valve stenosis. Pulmonic Valve: The pulmonic valve was normal in structure. Pulmonic valve regurgitation is not visualized. Pulmonic regurgitation is not visualized. Aorta: The aortic root is normal in size and structure. Venous: The inferior vena cava is dilated in size with greater than 50% respiratory variability, suggesting right atrial pressure of 8 mmHg.  Additional Comments: Normal LV function; restrictive filling; mild MR; left pleural effusion.  LEFT VENTRICLE PLAX 2D LVIDd:         4.70 cm Diastology LVIDs:         2.90 cm LV e' lateral:   7.94 cm/s LV PW:          1.00 cm LV E/e' lateral: 23.6 LV IVS:        1.10 cm LV e' medial:    7.94 cm/s LV SV:         70 ml   LV E/e' medial:  23.6 LV SV Index:   41.74  RIGHT VENTRICLE RV S prime:     11.10 cm/s LEFT ATRIUM         Index LA diam:    4.20 cm 2.56 cm/m   AORTA Ao Root diam: 2.50 cm MITRAL VALVE MV Area (PHT): 4.21 cm MV Peak grad:  15.7 mmHg MV Mean grad:  4.0 mmHg MV Vmax:       1.98 m/s MV Vmean:      81.1 cm/s MV VTI:        0.44 m MV PHT:        52.20 msec MV Decel Time: 180 msec MV E velocity: 187.00 cm/s 103 cm/s MV A velocity: 62.20 cm/s  70.3 cm/s MV E/A ratio:  3.01        1.5  Kirk Ruths MD Electronically signed by Kirk Ruths MD Signature Date/Time: 07/28/2019/12:10:49 PM    Final    [place] EKG SITE RITE  Result Date: 08/13/2019 If Site Rite image not attached, placement could not be confirmed due to current cardiac rhythm.

## 2019-08-15 NOTE — Progress Notes (Signed)
Ok to increase SSI to moderate scale per Dr Candiss Norse.  Onnie Boer, PharmD, BCIDP, AAHIVP, CPP Infectious Disease Pharmacist 08/15/2019 2:39 PM

## 2019-08-16 LAB — CBC WITH DIFFERENTIAL/PLATELET
Abs Immature Granulocytes: 0.03 10*3/uL (ref 0.00–0.07)
Basophils Absolute: 0 10*3/uL (ref 0.0–0.1)
Basophils Relative: 0 %
Eosinophils Absolute: 0.2 10*3/uL (ref 0.0–0.5)
Eosinophils Relative: 4 %
HCT: 27.7 % — ABNORMAL LOW (ref 36.0–46.0)
Hemoglobin: 8.5 g/dL — ABNORMAL LOW (ref 12.0–15.0)
Immature Granulocytes: 1 %
Lymphocytes Relative: 9 %
Lymphs Abs: 0.5 10*3/uL — ABNORMAL LOW (ref 0.7–4.0)
MCH: 27.8 pg (ref 26.0–34.0)
MCHC: 30.7 g/dL (ref 30.0–36.0)
MCV: 90.5 fL (ref 80.0–100.0)
Monocytes Absolute: 0.2 10*3/uL (ref 0.1–1.0)
Monocytes Relative: 3 %
Neutro Abs: 5.1 10*3/uL (ref 1.7–7.7)
Neutrophils Relative %: 83 %
Platelets: 277 10*3/uL (ref 150–400)
RBC: 3.06 MIL/uL — ABNORMAL LOW (ref 3.87–5.11)
RDW: 15.5 % (ref 11.5–15.5)
WBC: 6 10*3/uL (ref 4.0–10.5)
nRBC: 0 % (ref 0.0–0.2)

## 2019-08-16 LAB — COMPREHENSIVE METABOLIC PANEL
ALT: 23 U/L (ref 0–44)
AST: 15 U/L (ref 15–41)
Albumin: 2 g/dL — ABNORMAL LOW (ref 3.5–5.0)
Alkaline Phosphatase: 66 U/L (ref 38–126)
Anion gap: 8 (ref 5–15)
BUN: 38 mg/dL — ABNORMAL HIGH (ref 8–23)
CO2: 29 mmol/L (ref 22–32)
Calcium: 7.4 mg/dL — ABNORMAL LOW (ref 8.9–10.3)
Chloride: 100 mmol/L (ref 98–111)
Creatinine, Ser: 1.55 mg/dL — ABNORMAL HIGH (ref 0.44–1.00)
GFR calc Af Amer: 35 mL/min — ABNORMAL LOW (ref >60)
GFR calc non Af Amer: 30 mL/min — ABNORMAL LOW (ref >60)
Glucose, Bld: 268 mg/dL — ABNORMAL HIGH (ref 70–99)
Potassium: 4.3 mmol/L (ref 3.5–5.1)
Sodium: 137 mmol/L (ref 135–145)
Total Bilirubin: 0.5 mg/dL (ref 0.3–1.2)
Total Protein: 4.6 g/dL — ABNORMAL LOW (ref 6.5–8.1)

## 2019-08-16 LAB — MAGNESIUM: Magnesium: 1.7 mg/dL (ref 1.7–2.4)

## 2019-08-16 LAB — GLUCOSE, CAPILLARY
Glucose-Capillary: 353 mg/dL — ABNORMAL HIGH (ref 70–99)
Glucose-Capillary: 298 mg/dL — ABNORMAL HIGH (ref 70–99)
Glucose-Capillary: 278 mg/dL — ABNORMAL HIGH (ref 70–99)
Glucose-Capillary: 191 mg/dL — ABNORMAL HIGH (ref 70–99)
Glucose-Capillary: 177 mg/dL — ABNORMAL HIGH (ref 70–99)

## 2019-08-16 LAB — BRAIN NATRIURETIC PEPTIDE: B Natriuretic Peptide: 1120.1 pg/mL — ABNORMAL HIGH (ref 0.0–100.0)

## 2019-08-16 LAB — D-DIMER, QUANTITATIVE: D-Dimer, Quant: 3.82 ug/mL-FEU — ABNORMAL HIGH (ref 0.00–0.50)

## 2019-08-16 LAB — C-REACTIVE PROTEIN: CRP: 1.6 mg/dL — ABNORMAL HIGH (ref <1.0)

## 2019-08-16 MED ORDER — DIGOXIN 0.25 MG/ML IJ SOLN
0.2500 mg | Freq: Once | INTRAMUSCULAR | Status: AC
  Administered 2019-08-16: 09:00:00 0.25 mg via INTRAVENOUS
  Filled 2019-08-16: qty 1

## 2019-08-16 MED ORDER — FUROSEMIDE 10 MG/ML IJ SOLN
40.0000 mg | Freq: Once | INTRAMUSCULAR | Status: AC
  Administered 2019-08-16: 40 mg via INTRAVENOUS
  Filled 2019-08-16: qty 4

## 2019-08-16 MED ORDER — METOPROLOL TARTRATE 50 MG PO TABS
200.0000 mg | ORAL_TABLET | Freq: Two times a day (BID) | ORAL | Status: DC
  Administered 2019-08-16 – 2019-08-18 (×5): 200 mg via ORAL
  Filled 2019-08-16 (×5): qty 4

## 2019-08-16 NOTE — Progress Notes (Addendum)
Notified family of progress all questions answered and this nurse's number shared for further communication. Family requested patient go to rehab not home. Family doesn't feel comfortable with patient coming home until she is stronger.

## 2019-08-16 NOTE — Progress Notes (Signed)
PROGRESS NOTE                                                                                                                                                                                                             Patient Demographics:    Amber Ballard, is a 86 y.o. female, DOB - [birth year], ELF:[id number]  Outpatient Primary MD for the patient is Amber Ballard., MD    LOS - 3  Admit date - 08/13/2019    CC - SOB     Brief Narrative -  Amber Ballard  is a 86 y.o. female, with past medical history of paroxysmal atrial fibrillation [place] vas 2 score of 3, not on anticoagulation due to falls, recent left hip fracture with repair about 1 month ago, hypothyroidism, hypertension, dyslipidemia, DM type II, stroke, who was diagnosed with COVID-19 infection about 10 days ago at [place]. She developed some shortness of breath and came to [facility] ER where she was diagnosed with acute hypoxic respiratory failure along with A. fib RVR and sent to [facility] for further care.      Subjective:   Patient in bed, appears comfortable, denies any headache, no fever, no chest pain or pressure, no shortness of breath , no abdominal pain. No focal weakness.    Assessment  & Plan :     1. Acute Hypoxic Resp. Failure due to Acute Covid 19 Viral Pneumonitis during the ongoing 2020 Covid 19 Pandemic - she had moderate to severe disease was started on IV steroids ( finished)  along with remdesivir, is also element of acute on chronic diastolic CHF hence she was diuresed with IV Lasix.  Breathing is much improved on 08/14/2019, will increase activity and titrate down oxygen.  Encouraged the patient to sit up in chair in the daytime use I-S and flutter valve for pulmonary toiletry and then prone in bed when at night.  SpO2: 92 % O2 Flow Rate (L/min): 2 L/min  Recent Labs  Lab 08/13/19 1410 08/14/19 0500 08/15/19 0940 08/16/19 0500  CRP 7.1* 4.6* 2.6*  1.6*  DDIMER 2.56* 2.56* 4.87* 3.82*  BNP 831.0* 959.7* 855.4* 1,120.1*  PROCALCITON 0.48  --   --   --     Hepatic Function Latest Ref Rng & Units 08/16/2019 08/15/2019 08/14/2019  Total Protein 6.5 - 8.1 g/dL 4.6(L) 5.2(L) 4.8(L)  Albumin 3.5 - 5.0 g/dL 2.0(L) 2.2(L) 1.9(L)  AST 15 - 41 U/L '15 23 21  ' ALT 0 - 44 U/L '23 27 26  ' Alk Phosphatase 38 - 126 U/L 66 75 68  Total Bilirubin 0.3 - 1.2 mg/dL 0.5 0.6 0.5    2.  AKI.    Due to fluid overload improving with diuresis which will be continued with caution.  3.  Paroxysmal A. fib.  Currently in RVR.  [place] vas 2 score of greater than 3.  Not on anticoagulation due to fall risk, she was in RVR upon admission received 2 doses of IV digoxin and 1 dose will be repeated on 08/16/2019 for better control, continue home dose amiodarone, beta-blocker dose increased further on 08/16/2019 for better rate control.   4.  Hypertension.  For now beta-blocker, once med rec is complete will place on appropriate medications as needed.  5.  Hypothyroidism.  Once med rec is done home dose Synthroid, check TSH.  6.  Acute on chronic diastolic CHF.  Reviewed his recent echo from [place].  EF is around 60%.  Continue diuresis with Lasix and monitor.  Beta-blocker as needed.  7.  Dyslipidemia.  Home dose statin will be resumed after med rec done.  8.  History of CVA.  Appears to be on dual antiplatelet therapy and statin for secondary prevention.  Resume once med rec done.  9. Recent R hip fracture - PT , OT require SNF.  10. Hypokalemia.  Replaced.  Will monitor.    11. DM type II.    Sliding scale insulin, insulin dose adjusted due to hypoglycemia on 08/14/2019.  Acceptable outpatient control considering her age.  Lab Results  Component Value Date   HGBA1C 7.9 (H) 08/13/2019    CBG (last 3)  Recent Labs    08/15/19 1959 08/16/19 0726 08/16/19 1131  GLUCAP 259* 298* 278*    Condition - Fair  Family Communication  :  Daughter on the day of  admission, son-in-law over the phone on 1/9  Code Status :  DNR  Diet :   Diet Order            Diet Carb Modified Fluid consistency: Thin; Room service appropriate? Yes  Diet effective now               Disposition Plan  :  TBD  Consults  :  None  Procedures  :  None  PUD Prophylaxis : PPI  DVT Prophylaxis  :    Heparin    Lab Results  Component Value Date   PLT 277 08/16/2019    Inpatient Medications  Scheduled Meds: . amiodarone  200 mg Oral Daily  . aspirin  81 mg Oral Daily  . atorvastatin  80 mg Oral q1800  . clopidogrel  75 mg Oral Daily  . heparin  5,000 Units Subcutaneous Q8H  . insulin aspart  0-15 Units Subcutaneous TID WC  . insulin aspart  0-5 Units Subcutaneous QHS  . levothyroxine  75 mcg Oral Q0600  . metoprolol tartrate  200 mg Oral BID  . pantoprazole  40 mg Oral Daily  . venlafaxine XR  37.5 mg Oral Daily   Continuous Infusions: . remdesivir 100 mg in NS 100 mL 100 mg (08/16/19 1044)   PRN Meds:.acetaminophen, albuterol, bisacodyl, diltiazem, [DISCONTINUED] ondansetron **OR** ondansetron (ZOFRAN) IV, sodium chloride flush  Antibiotics  :    Anti-infectives (From admission, onward)   Start     Dose/Rate Route Frequency Ordered  Stop   08/14/19 1000  remdesivir 100 mg in sodium chloride 0.9 % 100 mL IVPB     100 mg 200 mL/hr over 30 Minutes Intravenous Daily 08/13/19 1020 08/18/19 0959   08/13/19 1130  remdesivir 200 mg in sodium chloride 0.9% 250 mL IVPB     200 mg 580 mL/hr over 30 Minutes Intravenous Once 08/13/19 1020 08/14/19 0700       Time Spent in minutes  30   Lala Lund M.D on 08/16/2019 at 12:05 PM  To page go to [email or web address] - password [facility]  [facility] -  Office  [phone]   See all Orders from today for further details    Objective:   Vitals:   08/16/19 0721 08/16/19 0834 08/16/19 0840 08/16/19 0852  BP: (!) 157/80 134/72    Pulse: (!) 110 (!) 108 (!) 117 98  Resp: (!) 21 16    Temp: 98.1 F  (36.7 C) 97.9 F (36.6 C)    TempSrc: Oral Oral    SpO2: 95% 92%      Wt Readings from Last 3 Encounters:  07/31/19 65 kg  01/20/19 60.5 kg  10/14/18 62.6 kg     Intake/Output Summary (Last 24 hours) at 08/16/2019 1205 Last data filed at 08/16/2019 1031 Gross per 24 hour  Intake 2800 ml  Output --  Net 2800 ml     Physical Exam  Awake Alert, No new F.N deficits, Normal affect [place].AT,PERRAL Supple Neck,No JVD, No cervical lymphadenopathy appriciated.  Symmetrical Chest wall movement, Good air movement bilaterally, CTAB RRR,No Gallops, Rubs or new Murmurs, No Parasternal Heave +ve B.Sounds, Abd Soft, No tenderness, No organomegaly appriciated, No rebound - guarding or rigidity. No Cyanosis, Clubbing or edema, No new Rash or bruise     Data Review:    CBC Recent Labs  Lab 08/13/19 1410 08/14/19 0500 08/15/19 0940 08/16/19 0500  WBC 13.7* 12.6* 10.0 6.0  HGB 8.0* 7.9* 8.4* 8.5*  HCT 25.8* 25.5* 27.3* 27.7*  PLT 205 212 264 277  MCV 88.7 89.5 90.1 90.5  MCH 27.5 27.7 27.7 27.8  MCHC 31.0 31.0 30.8 30.7  RDW 15.5 15.4 15.4 15.5  LYMPHSABS  --  0.3* 0.5* 0.5*  MONOABS  --  0.2 0.2 0.2  EOSABS  --  0.0 0.0 0.2  BASOSABS  --  0.0 0.0 0.0    Chemistries  Recent Labs  Lab 08/13/19 1410 08/14/19 0500 08/14/19 1548 08/15/19 0940 08/16/19 0500  NA  --  138  --  137 137  K  --  2.9* 4.8 4.4 4.3  CL  --  104  --  100 100  CO2  --  27  --  28 29  GLUCOSE  --  45*  --  232* 268*  BUN  --  32*  --  36* 38*  CREATININE 1.54* 1.37*  --  1.43* 1.55*  CALCIUM  --  7.3*  --  7.4* 7.4*  MG 1.9 1.9  --  1.9 1.7  AST  --  21  --  23 15  ALT  --  26  --  27 23  ALKPHOS  --  68  --  75 66  BILITOT  --  0.5  --  0.6 0.5   ------------------------------------------------------------------------------------------------------------------ No results for input(s): CHOL, HDL, LDLCALC, TRIG, CHOLHDL, LDLDIRECT in the last 72 hours.  Lab Results  Component Value Date    HGBA1C 7.9 (H) 08/13/2019   ------------------------------------------------------------------------------------------------------------------ Recent Labs    08/13/19  1410  TSH 0.794    Cardiac Enzymes No results for input(s): CKMB, TROPONINI, MYOGLOBIN in the last 168 hours.  Invalid input(s): CK ------------------------------------------------------------------------------------------------------------------    Component Value Date/Time   BNP 1,120.1 (H) 08/16/2019 0500    Micro Results No results found for this or any previous visit (from the past 240 hour(s)).  Radiology Reports DG Chest 1 View  Result Date: 07/27/2019 CLINICAL DATA:  Pneumonia EXAM: CHEST  1 VIEW COMPARISON:  07/24/2019 chest radiograph. FINDINGS: Stable cardiomediastinal silhouette with mild cardiomegaly. No pneumothorax. Stable small bilateral pleural effusions, left greater than right. Patchy apical right and parahilar left mid lung opacities, mildly increased. IMPRESSION: 1. Mildly increased patchy apical right in parahilar left mid lung opacities, compatible with reported pneumonia versus mild pulmonary edema. 2. Small stable bilateral pleural effusions, left greater than right. 3. Mild cardiomegaly. Electronically Signed   By: Ilona Sorrel M.D.   On: 07/27/2019 14:31   US RENAL  Result Date: 07/29/2019 CLINICAL DATA:  Acute kidney injury EXAM: RENAL / URINARY TRACT ULTRASOUND COMPLETE COMPARISON:  Ultrasound 07/26/2019 FINDINGS: Right Kidney: Renal measurements: 10.7 x 4.6 x 3.4 cm = volume: 89.3 mL. Diffusely increased echogenicity with slight loss of the corticomedullary differentiation. No visible renal calculus or hydronephrosis. No worrisome right renal lesion. Left Kidney: Renal measurements: 7.6 x 3.8 x 3.0 cm = volume: 44.7 mL. Echogenicity is within normal limits. No shadowing calculus, hydronephrosis or concerning renal mass. Bladder: Bladder is decompressed by inflated Foley catheter and therefore  poorly assessed at the time of imaging. Other: None. IMPRESSION: Increased right renal cortical echogenicity and loss of corticomedullary differentiation compatible with medical renal disease. Left kidney is a more normal appearance. No other concerning renal abnormality. Bladder decompressed by inflated Foley catheter. Electronically Signed   By: Lovena Le M.D.   On: 07/29/2019 03:52   ECHOCARDIOGRAM COMPLETE  Result Date: 07/28/2019   ECHOCARDIOGRAM REPORT   Patient Name:   RONEE RANGANATHAN Date of Exam: 07/28/2019 Medical Rec #:  [id number]    Height:       62.0 in Accession #:    [id number]   Weight:       140.0 lb Date of Birth:  Jul 06, 1932    BSA:          1.64 m Patient Age:    51 years     BP:           186/63 mmHg Patient Gender: F            HR:           66 bpm. Exam Location:  Inpatient Procedure: 2D Echo, Cardiac Doppler and Color Doppler Indications:    Atrial fibrillation  History:        Patient has no prior history of Echocardiogram examinations.                 CHF, Stroke, Arrythmias:Atrial Fibrillation; Risk                 Factors:Hypertension and Diabetes. Breast cancer.  Sonographer:    Dustin Flock Referring Phys: [place]  1. Left ventricular ejection fraction, by visual estimation, is 60 to 65%. The left ventricle has normal function. There is no left ventricular hypertrophy.  2. Left ventricular diastolic parameters are consistent with Grade III diastolic dysfunction (restrictive).  3. Global right ventricle has normal systolic function.The right ventricular size is normal.  4. Left atrial size was normal.  5. Right atrial  size was normal.  6. Moderate pleural effusion in the left lateral region.  7. Moderate mitral annular calcification.  8. The mitral valve is normal in structure. Mild mitral valve regurgitation. No evidence of mitral stenosis.  9. The tricuspid valve is normal in structure. 10. The aortic valve is tricuspid. Aortic valve regurgitation is not  visualized. Mild aortic valve sclerosis without stenosis. 11. The pulmonic valve was normal in structure. Pulmonic valve regurgitation is not visualized. 12. The inferior vena cava is dilated in size with >50% respiratory variability, suggesting right atrial pressure of 8 mmHg. 13. Normal LV function; restrictive filling; mild MR; left pleural effusion. 14. The left ventricle has no regional wall motion abnormalities. FINDINGS  Left Ventricle: Left ventricular ejection fraction, by visual estimation, is 60 to 65%. The left ventricle has normal function. The left ventricle has no regional wall motion abnormalities. There is no left ventricular hypertrophy. Left ventricular diastolic parameters are consistent with Grade III diastolic dysfunction (restrictive). Normal left atrial pressure. Right Ventricle: The right ventricular size is normal.Global RV systolic function is has normal systolic function. Left Atrium: Left atrial size was normal in size. Right Atrium: Right atrial size was normal in size Pericardium: There is no evidence of pericardial effusion. There is a moderate pleural effusion in the left lateral region. Mitral Valve: The mitral valve is normal in structure. Moderate mitral annular calcification. Mild mitral valve regurgitation. No evidence of mitral valve stenosis by observation. MV peak gradient, 15.7 mmHg. Tricuspid Valve: The tricuspid valve is normal in structure. Tricuspid valve regurgitation is trivial. Aortic Valve: The aortic valve is tricuspid. Aortic valve regurgitation is not visualized. Mild aortic valve sclerosis is present, with no evidence of aortic valve stenosis. Pulmonic Valve: The pulmonic valve was normal in structure. Pulmonic valve regurgitation is not visualized. Pulmonic regurgitation is not visualized. Aorta: The aortic root is normal in size and structure. Venous: The inferior vena cava is dilated in size with greater than 50% respiratory variability, suggesting right  atrial pressure of 8 mmHg.  Additional Comments: Normal LV function; restrictive filling; mild MR; left pleural effusion.  LEFT VENTRICLE PLAX 2D LVIDd:         4.70 cm Diastology LVIDs:         2.90 cm LV e' lateral:   7.94 cm/s LV PW:         1.00 cm LV E/e' lateral: 23.6 LV IVS:        1.10 cm LV e' medial:    7.94 cm/s LV SV:         70 ml   LV E/e' medial:  23.6 LV SV Index:   41.74  RIGHT VENTRICLE RV S prime:     11.10 cm/s LEFT ATRIUM         Index LA diam:    4.20 cm 2.56 cm/m   AORTA Ao Root diam: 2.50 cm MITRAL VALVE MV Area (PHT): 4.21 cm MV Peak grad:  15.7 mmHg MV Mean grad:  4.0 mmHg MV Vmax:       1.98 m/s MV Vmean:      81.1 cm/s MV VTI:        0.44 m MV PHT:        52.20 msec MV Decel Time: 180 msec MV E velocity: 187.00 cm/s 103 cm/s MV A velocity: 62.20 cm/s  70.3 cm/s MV E/A ratio:  3.01        1.5  Kirk Ruths MD Electronically signed by Kirk Ruths MD Signature  Date/Time: 07/28/2019/12:10:49 PM    Final    [place] EKG SITE RITE  Result Date: 08/13/2019 If Site Rite image not attached, placement could not be confirmed due to current cardiac rhythm.

## 2019-08-16 NOTE — Progress Notes (Signed)
Inpatient Diabetes Program Recommendations  AACE/ADA: New Consensus Statement on Inpatient Glycemic Control (2015)  Target Ranges:  Prepandial:   less than 140 mg/dL      Peak postprandial:   less than 180 mg/dL (1-2 hours)      Critically ill patients:  140 - 180 mg/dL   Lab Results  Component Value Date   GLUCAP 278 (H) 08/16/2019   HGBA1C 7.9 (H) 08/13/2019    Review of Glycemic Control Results for Amber Ballard, Amber Ballard (MRN [id number]) as of 08/16/2019 14:47  Ref. Range 08/15/2019 15:59 08/15/2019 19:59 08/16/2019 07:26 08/16/2019 11:31  Glucose-Capillary Latest Ref Range: 70 - 99 mg/dL 200 (H) 259 (H) 298 (H) 278 (H)   Diabetes history: DM 2 Outpatient Diabetes medications:  Novolog 0-14 units tid with meals, Levemir 15 units q HS Current orders for Inpatient glycemic control:  Novolog moderate tid with meals and HS Inpatient Diabetes Program Recommendations:    Please consider adding Levemir 8 units bid.    Thanks  Adah Perl, RN, BC-ADM Inpatient Diabetes Coordinator Pager [phone] (8a-5p)

## 2019-08-17 LAB — COMPREHENSIVE METABOLIC PANEL
ALT: 24 U/L (ref 0–44)
AST: 14 U/L — ABNORMAL LOW (ref 15–41)
Albumin: 2 g/dL — ABNORMAL LOW (ref 3.5–5.0)
Alkaline Phosphatase: 71 U/L (ref 38–126)
Anion gap: 12 (ref 5–15)
BUN: 46 mg/dL — ABNORMAL HIGH (ref 8–23)
CO2: 27 mmol/L (ref 22–32)
Calcium: 7.3 mg/dL — ABNORMAL LOW (ref 8.9–10.3)
Chloride: 96 mmol/L — ABNORMAL LOW (ref 98–111)
Creatinine, Ser: 1.52 mg/dL — ABNORMAL HIGH (ref 0.44–1.00)
GFR calc Af Amer: 36 mL/min — ABNORMAL LOW (ref >60)
GFR calc non Af Amer: 31 mL/min — ABNORMAL LOW (ref >60)
Glucose, Bld: 425 mg/dL — ABNORMAL HIGH (ref 70–99)
Potassium: 3.8 mmol/L (ref 3.5–5.1)
Sodium: 135 mmol/L (ref 135–145)
Total Bilirubin: 0.6 mg/dL (ref 0.3–1.2)
Total Protein: 4.6 g/dL — ABNORMAL LOW (ref 6.5–8.1)

## 2019-08-17 LAB — CBC WITH DIFFERENTIAL/PLATELET
Abs Immature Granulocytes: 0.02 10*3/uL (ref 0.00–0.07)
Basophils Absolute: 0 10*3/uL (ref 0.0–0.1)
Basophils Relative: 0 %
Eosinophils Absolute: 0.1 10*3/uL (ref 0.0–0.5)
Eosinophils Relative: 2 %
HCT: 28 % — ABNORMAL LOW (ref 36.0–46.0)
Hemoglobin: 8.6 g/dL — ABNORMAL LOW (ref 12.0–15.0)
Immature Granulocytes: 0 %
Lymphocytes Relative: 9 %
Lymphs Abs: 0.5 10*3/uL — ABNORMAL LOW (ref 0.7–4.0)
MCH: 27.6 pg (ref 26.0–34.0)
MCHC: 30.7 g/dL (ref 30.0–36.0)
MCV: 89.7 fL (ref 80.0–100.0)
Monocytes Absolute: 0.3 10*3/uL (ref 0.1–1.0)
Monocytes Relative: 5 %
Neutro Abs: 4.6 10*3/uL (ref 1.7–7.7)
Neutrophils Relative %: 84 %
Platelets: 320 10*3/uL (ref 150–400)
RBC: 3.12 MIL/uL — ABNORMAL LOW (ref 3.87–5.11)
RDW: 15.5 % (ref 11.5–15.5)
WBC: 5.5 10*3/uL (ref 4.0–10.5)
nRBC: 0 % (ref 0.0–0.2)

## 2019-08-17 LAB — BRAIN NATRIURETIC PEPTIDE: B Natriuretic Peptide: 937.9 pg/mL — ABNORMAL HIGH (ref 0.0–100.0)

## 2019-08-17 LAB — C-REACTIVE PROTEIN: CRP: 1.1 mg/dL — ABNORMAL HIGH (ref <1.0)

## 2019-08-17 LAB — GLUCOSE, CAPILLARY
Glucose-Capillary: 398 mg/dL — ABNORMAL HIGH (ref 70–99)
Glucose-Capillary: 277 mg/dL — ABNORMAL HIGH (ref 70–99)
Glucose-Capillary: 153 mg/dL — ABNORMAL HIGH (ref 70–99)
Glucose-Capillary: 114 mg/dL — ABNORMAL HIGH (ref 70–99)

## 2019-08-17 LAB — D-DIMER, QUANTITATIVE: D-Dimer, Quant: 3.5 ug/mL-FEU — ABNORMAL HIGH (ref 0.00–0.50)

## 2019-08-17 LAB — MAGNESIUM: Magnesium: 1.6 mg/dL — ABNORMAL LOW (ref 1.7–2.4)

## 2019-08-17 MED ORDER — AMLODIPINE BESYLATE 10 MG PO TABS
10.0000 mg | ORAL_TABLET | Freq: Every day | ORAL | Status: DC
  Administered 2019-08-17 – 2019-08-18 (×2): 10 mg via ORAL
  Filled 2019-08-17 (×2): qty 1

## 2019-08-17 MED ORDER — METOPROLOL TARTRATE 100 MG PO TABS: 200.0000 mg | ORAL_TABLET | Freq: Two times a day (BID) | ORAL | 0 refills | Status: AC

## 2019-08-17 NOTE — Progress Notes (Signed)
Notified family of progress all questions answered and this nurse's number shared for further communication.  

## 2019-08-17 NOTE — Discharge Instructions (Signed)
Follow with Primary MD Ocie Doyne., MD and your Cardiologist in 7 days   Get CBC, CMP, 2 view Chest X ray -  checked next visit within 1 week by Primary MD    Activity: As tolerated with Full fall precautions use walker/cane & assistance as needed  Disposition Home    Diet: Heart Healthy Low Carb  Special Instructions: If you have smoked or chewed Tobacco  in the last 2 yrs please stop smoking, stop any regular Alcohol  and or any Recreational drug use.  On your next visit with your primary care physician please Get Medicines reviewed and adjusted.  Please request your Prim.MD to go over all Hospital Tests and Procedure/Radiological results at the follow up, please get all Hospital records sent to your Prim MD by signing hospital release before you go home.  If you experience worsening of your admission symptoms, develop shortness of breath, life threatening emergency, suicidal or homicidal thoughts you must seek medical attention immediately by calling 911 or calling your MD immediately  if symptoms less severe.  You Must read complete instructions/literature along with all the possible adverse reactions/side effects for all the Medicines you take and that have been prescribed to you. Take any new Medicines after you have completely understood and accpet all the possible adverse reactions/side effects.       Person Under Monitoring Name: Amber Ballard  Location: [place] [address]   Infection Prevention Recommendations for Individuals Confirmed to have, or Being Evaluated for, 2019 Novel Coronavirus (COVID-19) Infection Who Receive Care at Home  Individuals who are confirmed to have, or are being evaluated for, COVID-19 should follow the prevention steps below until a healthcare provider or local or state health department says they can return to normal activities.  Stay home except to get medical care You should restrict activities outside your home,  except for getting medical care. Do not go to work, school, or public areas, and do not use public transportation or taxis.  Call ahead before visiting your doctor Before your medical appointment, call the healthcare provider and tell them that you have, or are being evaluated for, COVID-19 infection. This will help the healthcare provider's office take steps to keep other people from getting infected. Ask your healthcare provider to call the local or state health department.  Monitor your symptoms Seek prompt medical attention if your illness is worsening (e.g., difficulty breathing). Before going to your medical appointment, call the healthcare provider and tell them that you have, or are being evaluated for, COVID-19 infection. Ask your healthcare provider to call the local or state health department.  Wear a facemask You should wear a facemask that covers your nose and mouth when you are in the same room with other people and when you visit a healthcare provider. People who live with or visit you should also wear a facemask while they are in the same room with you.  Separate yourself from other people in your home As much as possible, you should stay in a different room from other people in your home. Also, you should use a separate bathroom, if available.  Avoid sharing household items You should not share dishes, drinking glasses, cups, eating utensils, towels, bedding, or other items with other people in your home. After using these items, you should wash them thoroughly with soap and water.  Cover your coughs and sneezes Cover your mouth and nose with a tissue when you cough or sneeze,  or you can cough or sneeze into your sleeve. Throw used tissues in a lined trash can, and immediately wash your hands with soap and water for at least 20 seconds or use an alcohol-based hand rub.  Wash your [facility] your hands often and thoroughly with soap and water for at least 20 seconds.  You can use an alcohol-based hand sanitizer if soap and water are not available and if your hands are not visibly dirty. Avoid touching your eyes, nose, and mouth with unwashed hands.   Prevention Steps for Caregivers and Household Members of Individuals Confirmed to have, or Being Evaluated for, COVID-19 Infection Being Cared for in the Home  If you live with, or provide care at home for, a person confirmed to have, or being evaluated for, COVID-19 infection please follow these guidelines to prevent infection:  Follow healthcare provider's instructions Make sure that you understand and can help the patient follow any healthcare provider instructions for all care.  Provide for the patient's basic needs You should help the patient with basic needs in the home and provide support for getting groceries, prescriptions, and other personal needs.  Monitor the patient's symptoms If they are getting sicker, call his or her medical provider and tell them that the patient has, or is being evaluated for, COVID-19 infection. This will help the healthcare provider's office take steps to keep other people from getting infected. Ask the healthcare provider to call the local or state health department.  Limit the number of people who have contact with the patient  If possible, have only one caregiver for the patient.  Other household members should stay in another home or place of residence. If this is not possible, they should stay  in another room, or be separated from the patient as much as possible. Use a separate bathroom, if available.  Restrict visitors who do not have an essential need to be in the home.  Keep older adults, very young children, and other sick people away from the patient Keep older adults, very young children, and those who have compromised immune systems or chronic health conditions away from the patient. This includes people with chronic heart, lung, or kidney conditions,  diabetes, and cancer.  Ensure good ventilation Make sure that shared spaces in the home have good air flow, such as from an air conditioner or an opened window, weather permitting.  Wash your hands often  Wash your hands often and thoroughly with soap and water for at least 20 seconds. You can use an alcohol based hand sanitizer if soap and water are not available and if your hands are not visibly dirty.  Avoid touching your eyes, nose, and mouth with unwashed hands.  Use disposable paper towels to dry your hands. If not available, use dedicated cloth towels and replace them when they become wet.  Wear a facemask and gloves  Wear a disposable facemask at all times in the room and gloves when you touch or have contact with the patient's blood, body fluids, and/or secretions or excretions, such as sweat, saliva, sputum, nasal mucus, vomit, urine, or feces.  Ensure the mask fits over your nose and mouth tightly, and do not touch it during use.  Throw out disposable facemasks and gloves after using them. Do not reuse.  Wash your hands immediately after removing your facemask and gloves.  If your personal clothing becomes contaminated, carefully remove clothing and launder. Wash your hands after handling contaminated clothing.  Place all used disposable  facemasks, gloves, and other waste in a lined container before disposing them with other household waste.  Remove gloves and wash your hands immediately after handling these items.  Do not share dishes, glasses, or other household items with the patient  Avoid sharing household items. You should not share dishes, drinking glasses, cups, eating utensils, towels, bedding, or other items with a patient who is confirmed to have, or being evaluated for, COVID-19 infection.  After the person uses these items, you should wash them thoroughly with soap and water.  Wash laundry thoroughly  Immediately remove and wash clothes or bedding that have  blood, body fluids, and/or secretions or excretions, such as sweat, saliva, sputum, nasal mucus, vomit, urine, or feces, on them.  Wear gloves when handling laundry from the patient.  Read and follow directions on labels of laundry or clothing items and detergent. In general, wash and dry with the warmest temperatures recommended on the label.  Clean all areas the individual has used often  Clean all touchable surfaces, such as counters, tabletops, doorknobs, bathroom fixtures, toilets, phones, keyboards, tablets, and bedside tables, every day. Also, clean any surfaces that may have blood, body fluids, and/or secretions or excretions on them.  Wear gloves when cleaning surfaces the patient has come in contact with.  Use a diluted bleach solution (e.g., dilute bleach with 1 part bleach and 10 parts water) or a household disinfectant with a label that says EPA-registered for coronaviruses. To make a bleach solution at home, add 1 tablespoon of bleach to 1 quart (4 cups) of water. For a larger supply, add  cup of bleach to 1 gallon (16 cups) of water.  Read labels of cleaning products and follow recommendations provided on product labels. Labels contain instructions for safe and effective use of the cleaning product including precautions you should take when applying the product, such as wearing gloves or eye protection and making sure you have good ventilation during use of the product.  Remove gloves and wash hands immediately after cleaning.  Monitor yourself for signs and symptoms of illness Caregivers and household members are considered close contacts, should monitor their health, and will be asked to limit movement outside of the home to the extent possible. Follow the monitoring steps for close contacts listed on the symptom monitoring form.   ? If you have additional questions, contact your local health department or call the epidemiologist on call at [phone] (available 24/7). ?  This guidance is subject to change. For the most up-to-date guidance from [facility], please refer to their website: [email or web address]

## 2019-08-17 NOTE — NC FL2 (Signed)
[place] LEVEL OF CARE SCREENING TOOL     IDENTIFICATION  Patient Name: Amber Ballard Birthdate: 1931/10/16 Sex: female Admission Date (Current Location): 08/13/2019  [facility] and [place] Number:  Herbalist and Address:  [facility] [place], [place] [address] [place], [place] [address]      Provider Number: [id number]  Attending Physician Name and Address:  Thurnell Lose, MD  Relative Name and Phone Number:  [name] [phone]    Current Level of Care: Hospital Recommended Level of Care: [place] Prior Approval Number:    Date Approved/Denied:   PASRR Number: [id number]  Discharge Plan: SNF    Current Diagnoses: Patient Active Problem List   Diagnosis Date Noted  . COVID-19 virus infection 08/13/2019  . COVID-19 07/27/2019  . ACS (acute coronary syndrome) ([place]) 07/27/2019  . Pneumonia of both lungs due to infectious organism   . Lung nodule 01/21/2019  . DKA (diabetic ketoacidoses) ([place]) 01/20/2019  . Multiple fractures of ribs, right side, initial encounter for closed fracture 01/20/2019  . CHF, acute on chronic ([place]) 01/20/2019  . Unspecified atrial fibrillation ([place]) 01/20/2019  . History of CVA (cerebrovascular accident) 01/20/2019  . Hypothyroidism     Orientation RESPIRATION BLADDER Height & Weight     Self  Normal Continent Weight:   Height:     BEHAVIORAL SYMPTOMS/MOOD NEUROLOGICAL BOWEL NUTRITION STATUS      Continent Diet(see discharge summary)  AMBULATORY STATUS COMMUNICATION OF NEEDS Skin   Limited Assist Verbally Normal, Skin abrasions                       Personal Care Assistance Level of Assistance  Bathing, Dressing Bathing Assistance: Maximum assistance   Dressing Assistance: Maximum assistance     Functional Limitations Info             SPECIAL CARE FACTORS FREQUENCY  PT (By licensed PT), OT (By licensed OT)     PT Frequency: 5 times a week OT Frequency: 5  times a week            Contractures      Additional Factors Info  Code Status, Isolation Precautions Code Status Info: DNR       Isolation Precautions Info: COVID +     Current Medications (08/17/2019):  This is the current hospital active medication list Current Facility-Administered Medications  Medication Dose Route Frequency Provider Last Rate Last Admin  . acetaminophen (TYLENOL) tablet 650 mg  650 mg Oral Q6H PRN Thurnell Lose, MD      . albuterol (VENTOLIN HFA) 108 (90 Base) MCG/ACT inhaler 2 puff  2 puff Inhalation Q6H PRN Thurnell Lose, MD      . amiodarone (PACERONE) tablet 200 mg  200 mg Oral Daily Thurnell Lose, MD   200 mg at 08/17/19 1028  . amLODipine (NORVASC) tablet 10 mg  10 mg Oral Daily Thurnell Lose, MD   10 mg at 08/17/19 1028  . aspirin EC tablet 81 mg  81 mg Oral Daily Thurnell Lose, MD   81 mg at 08/17/19 1029  . atorvastatin (LIPITOR) tablet 80 mg  80 mg Oral q1800 Thurnell Lose, MD   80 mg at 08/16/19 1716  . bisacodyl (DULCOLAX) EC tablet 5 mg  5 mg Oral Daily PRN Thurnell Lose, MD      . clopidogrel (PLAVIX) tablet 75 mg  75 mg Oral Daily Lala Lund  K, MD   75 mg at 08/17/19 1029  . diltiazem (CARDIZEM) injection 10 mg  10 mg Intravenous Q6H PRN Thurnell Lose, MD   10 mg at 08/14/19 0527  . heparin injection 5,000 Units  5,000 Units Subcutaneous Q8H Thurnell Lose, MD   5,000 Units at 08/17/19 0549  . insulin aspart (novoLOG) injection 0-15 Units  0-15 Units Subcutaneous TID WC Thurnell Lose, MD   8 Units at 08/17/19 1205  . insulin aspart (novoLOG) injection 0-5 Units  0-5 Units Subcutaneous QHS Thurnell Lose, MD   3 Units at 08/15/19 2115  . levothyroxine (SYNTHROID) tablet 75 mcg  75 mcg Oral Q0600 Thurnell Lose, MD   75 mcg at 08/17/19 0548  . metoprolol tartrate (LOPRESSOR) tablet 200 mg  200 mg Oral BID Thurnell Lose, MD   200 mg at 08/17/19 1028  . ondansetron (ZOFRAN) injection 4 mg  4 mg  Intravenous Q6H PRN Thurnell Lose, MD      . pantoprazole (PROTONIX) EC tablet 40 mg  40 mg Oral Daily Thurnell Lose, MD   40 mg at 08/17/19 1028  . sodium chloride flush (NS) 0.9 % injection 10-40 mL  10-40 mL Intracatheter PRN Thurnell Lose, MD      . venlafaxine XR (EFFEXOR-XR) 24 hr capsule 37.5 mg  37.5 mg Oral Daily Thurnell Lose, MD   37.5 mg at 08/17/19 1028     Discharge Medications: Please see discharge summary for a list of discharge medications.  Relevant Imaging Results:  Relevant Lab Results:   Additional Information [id number] [place], [place]

## 2019-08-17 NOTE — Progress Notes (Addendum)
Physical Therapy Treatment Patient Details Name: Amber Ballard MRN: [id number] DOB: [birth year] Today's Date: 08/17/2019    History of Present Illness 86 y.o. female, with past medical history of paroxysmal atrial fibrillation [place] vas 2 score of 3, not on anticoagulation due to falls, recent left hip fracture with repair about 1 month ago, hypothyroidism, hypertension, dyslipidemia, DM type II, stroke, who was diagnosed with COVID-19 infection about 10 days ago at [place].  She developed some shortness of breath and came to [facility] ER where she was diagnosed with acute hypoxic respiratory failure and sent to [facility] for further care.  She was also found to have A. fib RVR, AKI during her stay at [facility] ER.    PT Comments    The patient requires cues and assist of  1 to mobilize to BSc and ambulate x 25'  Using RW. PTA, patient was ambulating mod. Independent with RW . Patient currently requires assistance for balance and stability. Patient could benefit from post acute  Rehab if patient has to be home alone, she requires assistance at present. Continue PT for mobility and Dc planning.   Patient reports dizziness, Assisted back to bed. BP LUE 122/67. HR varied from 95-131.Assisted up again to ambulate without further dizziness.  Follow Up Recommendations  SNF(unless family available 24/7)     Equipment Recommendations  None recommended by PT    Recommendations for Other Services       Precautions / Restrictions Precautions Precautions: Fall Precaution Comments: recent fall with right hip fx s/p surgery Restrictions Other Position/Activity Restrictions: Pt reports WBAT since R hip sx 06/2019    Mobility  Bed Mobility   Bed Mobility: Supine to Sit     Supine to sit: Mod assist     General bed mobility comments: assist for trunk and to scoot forward  Transfers Overall transfer level: Needs assistance Equipment used: Rolling walker (2 wheeled) Transfers: Sit to/from [employer] Sit to Stand: Min assist;Mod assist Stand pivot transfers: Min assist;Mod assist       General transfer comment: Pt initially min assist for transfers, progressing to mod assist for balance and support  to tranasfer from bed to [facility] and back.  Ambulation/Gait Ambulation/Gait assistance: Min assist Gait Distance (Feet): 25 Feet Assistive device: Rolling walker (2 wheeled) Gait Pattern/deviations: Step-to pattern;Step-through pattern Gait velocity: Decreased   General Gait Details: pt slow to mobilize, requires support for gait with RW,   Stairs             Wheelchair Mobility    Modified Rankin (Stroke Patients Only)       Balance Overall balance assessment: Needs assistance;History of Falls Sitting-balance support: Feet supported;Bilateral upper extremity supported Sitting balance-Leahy Scale: Good     Standing balance support: During functional activity;Bilateral upper extremity supported Standing balance-Leahy Scale: Poor Standing balance comment: needs UE support                            Cognition Arousal/Alertness: Awake/alert Behavior During Therapy: WFL for tasks assessed/performed Overall Cognitive Status: No family/caregiver present to determine baseline cognitive functioning Area of Impairment: Attention;Memory;Awareness;Problem solving                   Current Attention Level: Sustained;Selective Memory: Decreased short-term memory     Awareness: Emergent Problem Solving: Requires verbal cues General Comments: Likely baseline cognition exacerbated by [facility], [facility] to [facility]and [place]      Exercises  General Comments        Pertinent Vitals/Pain Pain Assessment: No/denies pain    Home Living                      Prior Function            PT Goals (current goals can now be found in the care plan section) Progress towards PT goals: Progressing toward goals    Frequency    Min  3X/week      PT Plan Discharge plan needs to be updated    Co-evaluation              AM-PAC PT "6 Clicks" Mobility   Outcome Measure  Help needed turning from your back to your side while in a flat bed without using bedrails?: A Lot Help needed moving from lying on your back to sitting on the side of a flat bed without using bedrails?: A Lot Help needed moving to and from a bed to a chair (including a wheelchair)?: A Lot Help needed standing up from a chair using your arms (e.g., wheelchair or bedside chair)?: A Lot Help needed to walk in hospital room?: A Lot Help needed climbing 3-5 steps with a railing? : Total 6 Click Score: 11    End of Session Equipment Utilized During Treatment: Gait belt Activity Tolerance: Patient tolerated treatment well Patient left: in chair;with call bell/phone within reach;with chair alarm set Nurse Communication: Mobility status PT Visit Diagnosis: Unsteadiness on feet (R26.81);Muscle weakness (generalized) (M62.81);History of falling (Z91.81)     Time: 7017-7939 PT Time Calculation (min) (ACUTE ONLY): 50 min  Charges:  $Gait Training: 8-22 mins $Therapeutic Activity: 8-22 mins $Self Care/Home Management: [place] Pager [phone] Office [phone]    Claretha Cooper 08/17/2019, 10:00 AM

## 2019-08-17 NOTE — Care Management Important Message (Signed)
Important Message  Patient Details  Name: Amber Ballard MRN: [id number] Date of Birth: [birth year]   Medicare Important Message Given:  Yes - Important Message mailed due to current National Emergency  Verbal consent obtained due to current National Emergency  Relationship to patient: Child Contact Name: [name] Call Date: 08/17/19  Time: 1152 Phone: [phone] Outcome: Spoke with contact Important Message mailed to: Patient address on file    Delorse Lek 08/17/2019, 11:53 AM

## 2019-08-17 NOTE — Discharge Summary (Addendum)
Amber Ballard AXE:[id number] DOB: 03-27-1932 DOA: 08/13/2019  PCP: Ocie Doyne., MD  Admit date: 08/13/2019  Discharge date: 08/17/2019  Admitted From: Home   Disposition: SNF, family after discharge to home has changed their mind that they would not be able to provide her help.  SNF requested on 08/17/2019.   Recommendations for Outpatient Follow-up:   Follow up with PCP in 1-2 weeks  PCP Please obtain BMP/CBC, 2 view CXR in 1week,  (see Discharge instructions)   PCP Please follow up on the following pending results:    Home Health: PT, RN   Equipment/Devices: None  Consultations: None  Discharge Condition: Stable    CODE STATUS: Full    Diet Recommendation: Heart Healthy Low Carb  Diet Order            Diet - low sodium heart healthy        Diet Carb Modified Fluid consistency: Thin; Room service appropriate? Yes  Diet effective now               CC - SOB   Brief history of present illness from the day of admission and additional interim summary    [name] a86 y.o.female,with past medical history of paroxysmal atrial fibrillation [place] vas 2 score of 3, not on anticoagulation due to falls, recent left hip fracture with repair about 1 month ago, hypothyroidism, hypertension, dyslipidemia, DM type II, stroke, who was diagnosed with COVID-19 infection about 10 days ago at [place]. She developed some shortness of breath and came to [facility] ER where she was diagnosed with acute hypoxic respiratory failure along with A. fib RVR and sent to [facility] for further care.                                                                    Hospital Course    1. Acute Hypoxic Resp. Failure due to Acute Covid 19 Viral Pneumonitis during the ongoing 2020 Covid 19 Pandemic - she had moderate to severe disease  was started on IV steroids ( finished)  along with remdesivir, he has finished her treatment course and now symptom-free on room air for the last 2 days.  Will be discharged home with outpatient PCP follow-up.  Recent Labs  Lab 08/13/19 1410 08/14/19 0500 08/15/19 0940 08/16/19 0500 08/17/19 0500  CRP 7.1* 4.6* 2.6* 1.6* 1.1*  DDIMER 2.56* 2.56* 4.87* 3.82* 3.50*  BNP 831.0* 959.7* 855.4* 1,120.1* 937.9*  PROCALCITON 0.48  --   --   --   --     Hepatic Function Latest Ref Rng & Units 08/17/2019 08/16/2019 08/15/2019  Total Protein 6.5 - 8.1 g/dL 4.6(L) 4.6(L) 5.2(L)  Albumin 3.5 - 5.0 g/dL 2.0(L) 2.0(L) 2.2(L)  AST 15 - 41 U/L 14(L) 15 23  ALT 0 - 44  U/L _0 Alk Phosphatase 38 - 126 U/L 71 66 75  Total Bilirubin 0.3 - 1.2 mg/dL 0.6 0.5 0.6     2.AKI on CKD 4.  Baseline creatinine around 1.6.  Post diuresis renal function close to baseline.  3.Paroxysmal A. fib. Currently in RVR. [place] vas 2 score of greater than 3. Not on anticoagulation due to fall risk, she was in RVR upon admission was continued on home dose amiodarone, beta-blocker dose has been increased and she also received few doses of IV digoxin with good results, will follow with her primary cardiologist and PCP within a week of discharge.   4.Hypertension. For now beta-blocker, once med rec is complete will place on appropriate medications as needed.  5.Hypothyroidism. Once med rec is done home dose Synthroid, check TSH.  6.Acute on chronic diastolic CHF. Reviewed his recent echo from [place]. EF is around 60%.  Continue diuresis withLasix at home dose and follow with PCP and primary cardiologist.  7.Dyslipidemia. Home dose statin .  8.History of CVA. No acute issues continue home regimen.  9. Recent R hip fracture - PT , OT, qualified for home health PT which has been ordered.  10.Hypokalemia.  Replaced and stable.  11. DM type II. Continue home regimen upon discharge  follow with PCP for glycemic control.  Acceptable outpatient control considering her age.   Discharge diagnosis     Active Problems:   COVID-19 virus infection    Discharge instructions    Discharge Instructions    Diet - low sodium heart healthy   Complete by: As directed    Discharge instructions   Complete by: As directed    Follow with Primary MD Ocie Doyne., MD and your Cardiologist in 7 days   Get CBC, CMP, 2 view Chest X ray -  checked next visit within 1 week by Primary MD    Activity: As tolerated with Full fall precautions use walker/cane & assistance as needed  Disposition Home    Diet: Heart Healthy Low Carb  Special Instructions: If you have smoked or chewed Tobacco  in the last 2 yrs please stop smoking, stop any regular Alcohol  and or any Recreational drug use.  On your next visit with your primary care physician please Get Medicines reviewed and adjusted.  Please request your Prim.MD to go over all Hospital Tests and Procedure/Radiological results at the follow up, please get all Hospital records sent to your Prim MD by signing hospital release before you go home.  If you experience worsening of your admission symptoms, develop shortness of breath, life threatening emergency, suicidal or homicidal thoughts you must seek medical attention immediately by calling 911 or calling your MD immediately  if symptoms less severe.  You Must read complete instructions/literature along with all the possible adverse reactions/side effects for all the Medicines you take and that have been prescribed to you. Take any new Medicines after you have completely understood and accpet all the possible adverse reactions/side effects.   Increase activity slowly   Complete by: As directed    MyChart COVID-19 home monitoring program   Complete by: Aug 17, 2019    Is the patient willing to use the [place] for home monitoring?: Yes   Temperature monitoring   Complete by:  Aug 17, 2019    After how many days would you like to receive a notification of this patient's flowsheet entries?: 1      Discharge Medications   Allergies  as of 08/17/2019   No Known Allergies     Medication List    TAKE these medications   acetaminophen 500 MG tablet Commonly known as: TYLENOL Take 500 mg by mouth every 6 (six) hours as needed for mild pain.   amiodarone 200 MG tablet Commonly known as: PACERONE Take 1 tablet (200 mg total) by mouth 2 (two) times daily for 4 days, THEN 1 tablet (200 mg total) daily. Start taking on: July 31, 2019   amLODipine 10 MG tablet Commonly known as: NORVASC Take 10 mg by mouth daily.   aspirin 81 MG tablet Take 1 tablet (81 mg total) by mouth daily.   atorvastatin 80 MG tablet Commonly known as: LIPITOR Take 80 mg by mouth daily at 6 PM.   clopidogrel 75 MG tablet Commonly known as: PLAVIX Take 75 mg by mouth daily.   FIBER LAXATIVE PO Take 1 tablet by mouth daily as needed.   furosemide 40 MG tablet Commonly known as: LASIX Take 1 tablet (40 mg total) by mouth daily as needed for fluid or edema (as needed for weight gain 3 lbs in 24 hours or 5 lbs in 7 days.).   hydrALAZINE 100 MG tablet Commonly known as: APRESOLINE Take 1 tablet (100 mg total) by mouth every 8 (eight) hours.   insulin aspart 100 UNIT/ML injection Commonly known as: novoLOG Inject 0-14 Units into the skin 3 (three) times daily with meals. Sliding scale per Dr. Micheal Likens   insulin detemir 100 UNIT/ML injection Commonly known as: LEVEMIR Inject 15 Units into the skin at bedtime.   isosorbide mononitrate 30 MG 24 hr tablet Commonly known as: IMDUR Take 30 mg by mouth daily.   levothyroxine 75 MCG tablet Commonly known as: SYNTHROID Take 75 mcg by mouth daily before breakfast.   Magnesium 400 MG Tabs Take 400 mg by mouth 2 (two) times daily.   metoprolol tartrate 100 MG tablet Commonly known as: LOPRESSOR Take 2 tablets (200 mg total) by  mouth 2 (two) times daily. What changed:   medication strength  how much to take   mirtazapine 15 MG tablet Commonly known as: REMERON Take 15 mg by mouth at bedtime.   potassium chloride 10 MEQ tablet Commonly known as: KLOR-CON Take 1 tablet (10 mEq total) by mouth daily as needed (Take only with furosemide.).   senna 8.6 MG Tabs tablet Commonly known as: SENOKOT Take 2 tablets (17.2 mg total) by mouth 2 (two) times daily. What changed:   when to take this  reasons to take this   venlafaxine XR 37.5 MG 24 hr capsule Commonly known as: EFFEXOR-XR Take 37.5 mg by mouth daily.   vitamin B-12 1000 MCG tablet Commonly known as: CYANOCOBALAMIN Take 1,000 mcg by mouth daily.   Vitamin D (Ergocalciferol) 1.25 MG (50000 UNIT) Caps capsule Commonly known as: DRISDOL Take 50,000 Units by mouth every 7 (seven) days.       Follow-up Information    Ocie Doyne., MD. Schedule an appointment as soon as possible for a visit in 1 week(s).   Specialty: Family Medicine Contact information: [place] [address] [phone]        Park Liter, MD. Schedule an appointment as soon as possible for a visit in 1 week(s).   Specialty: Cardiology Contact information: [place] [address] [phone]           Major procedures and Radiology Reports - PLEASE review detailed and final  reports thoroughly  -       DG Chest 1 View  Result Date: 07/27/2019 CLINICAL DATA:  Pneumonia EXAM: CHEST  1 VIEW COMPARISON:  07/24/2019 chest radiograph. FINDINGS: Stable cardiomediastinal silhouette with mild cardiomegaly. No pneumothorax. Stable small bilateral pleural effusions, left greater than right. Patchy apical right and parahilar left mid lung opacities, mildly increased. IMPRESSION: 1. Mildly increased patchy apical right in parahilar left mid lung opacities, compatible with reported pneumonia versus mild pulmonary edema. 2. Small stable  bilateral pleural effusions, left greater than right. 3. Mild cardiomegaly. Electronically Signed   By: Ilona Sorrel M.D.   On: 07/27/2019 14:31   US RENAL  Result Date: 07/29/2019 CLINICAL DATA:  Acute kidney injury EXAM: RENAL / URINARY TRACT ULTRASOUND COMPLETE COMPARISON:  Ultrasound 07/26/2019 FINDINGS: Right Kidney: Renal measurements: 10.7 x 4.6 x 3.4 cm = volume: 89.3 mL. Diffusely increased echogenicity with slight loss of the corticomedullary differentiation. No visible renal calculus or hydronephrosis. No worrisome right renal lesion. Left Kidney: Renal measurements: 7.6 x 3.8 x 3.0 cm = volume: 44.7 mL. Echogenicity is within normal limits. No shadowing calculus, hydronephrosis or concerning renal mass. Bladder: Bladder is decompressed by inflated Foley catheter and therefore poorly assessed at the time of imaging. Other: None. IMPRESSION: Increased right renal cortical echogenicity and loss of corticomedullary differentiation compatible with medical renal disease. Left kidney is a more normal appearance. No other concerning renal abnormality. Bladder decompressed by inflated Foley catheter. Electronically Signed   By: Lovena Le M.D.   On: 07/29/2019 03:52   ECHOCARDIOGRAM COMPLETE  Result Date: 07/28/2019   ECHOCARDIOGRAM REPORT   Patient Name:   CLELLA MCKEEL Date of Exam: 07/28/2019 Medical Rec #:  [id number]    Height:       62.0 in Accession #:    [id number]   Weight:       140.0 lb Date of Birth:  [birth year]    BSA:          1.64 m Patient Age:    86 years     BP:           186/63 mmHg Patient Gender: F            HR:           66 bpm. Exam Location:  Inpatient Procedure: 2D Echo, Cardiac Doppler and Color Doppler Indications:    Atrial fibrillation  History:        Patient has no prior history of Echocardiogram examinations.                 CHF, Stroke, Arrythmias:Atrial Fibrillation; Risk                 Factors:Hypertension and Diabetes. Breast cancer.  Sonographer:    Dustin Flock Referring Phys: [place]  1. Left ventricular ejection fraction, by visual estimation, is 60 to 65%. The left ventricle has normal function. There is no left ventricular hypertrophy.  2. Left ventricular diastolic parameters are consistent with Grade III diastolic dysfunction (restrictive).  3. Global right ventricle has normal systolic function.The right ventricular size is normal.  4. Left atrial size was normal.  5. Right atrial size was normal.  6. Moderate pleural effusion in the left lateral region.  7. Moderate mitral annular calcification.  8. The mitral valve is normal in structure. Mild mitral valve regurgitation. No evidence of mitral stenosis.  9. The tricuspid valve is normal in structure. 10. The aortic valve  is tricuspid. Aortic valve regurgitation is not visualized. Mild aortic valve sclerosis without stenosis. 11. The pulmonic valve was normal in structure. Pulmonic valve regurgitation is not visualized. 12. The inferior vena cava is dilated in size with >50% respiratory variability, suggesting right atrial pressure of 8 mmHg. 13. Normal LV function; restrictive filling; mild MR; left pleural effusion. 14. The left ventricle has no regional wall motion abnormalities. FINDINGS  Left Ventricle: Left ventricular ejection fraction, by visual estimation, is 60 to 65%. The left ventricle has normal function. The left ventricle has no regional wall motion abnormalities. There is no left ventricular hypertrophy. Left ventricular diastolic parameters are consistent with Grade III diastolic dysfunction (restrictive). Normal left atrial pressure. Right Ventricle: The right ventricular size is normal.Global RV systolic function is has normal systolic function. Left Atrium: Left atrial size was normal in size. Right Atrium: Right atrial size was normal in size Pericardium: There is no evidence of pericardial effusion. There is a moderate pleural effusion in the left lateral  region. Mitral Valve: The mitral valve is normal in structure. Moderate mitral annular calcification. Mild mitral valve regurgitation. No evidence of mitral valve stenosis by observation. MV peak gradient, 15.7 mmHg. Tricuspid Valve: The tricuspid valve is normal in structure. Tricuspid valve regurgitation is trivial. Aortic Valve: The aortic valve is tricuspid. Aortic valve regurgitation is not visualized. Mild aortic valve sclerosis is present, with no evidence of aortic valve stenosis. Pulmonic Valve: The pulmonic valve was normal in structure. Pulmonic valve regurgitation is not visualized. Pulmonic regurgitation is not visualized. Aorta: The aortic root is normal in size and structure. Venous: The inferior vena cava is dilated in size with greater than 50% respiratory variability, suggesting right atrial pressure of 8 mmHg.  Additional Comments: Normal LV function; restrictive filling; mild MR; left pleural effusion.  LEFT VENTRICLE PLAX 2D LVIDd:         4.70 cm Diastology LVIDs:         2.90 cm LV e' lateral:   7.94 cm/s LV PW:         1.00 cm LV E/e' lateral: 23.6 LV IVS:        1.10 cm LV e' medial:    7.94 cm/s LV SV:         70 ml   LV E/e' medial:  23.6 LV SV Index:   41.74  RIGHT VENTRICLE RV S prime:     11.10 cm/s LEFT ATRIUM         Index LA diam:    4.20 cm 2.56 cm/m   AORTA Ao Root diam: 2.50 cm MITRAL VALVE MV Area (PHT): 4.21 cm MV Peak grad:  15.7 mmHg MV Mean grad:  4.0 mmHg MV Vmax:       1.98 m/s MV Vmean:      81.1 cm/s MV VTI:        0.44 m MV PHT:        52.20 msec MV Decel Time: 180 msec MV E velocity: 187.00 cm/s 103 cm/s MV A velocity: 62.20 cm/s  70.3 cm/s MV E/A ratio:  3.01        1.5  Kirk Ruths MD Electronically signed by Kirk Ruths MD Signature Date/Time: 07/28/2019/12:10:49 PM    Final    [place] EKG SITE RITE  Result Date: 08/13/2019 If Site Rite image not attached, placement could not be confirmed due to current cardiac rhythm.   Micro Results     No results found  for this or any previous  visit (from the past 240 hour(s)).  Today   Subjective    Amber Ballard today has no headache,no chest abdominal pain,no new weakness tingling or numbness, feels much better wants to go home today.     Objective   Blood pressure (!) 159/98, pulse 95, temperature 98 F (36.7 C), temperature source Oral, resp. rate 16, SpO2 95 %.   Intake/Output Summary (Last 24 hours) at 08/17/2019 0904 Last data filed at 08/17/2019 0500 Gross per 24 hour  Intake 2800 ml  Output 1000 ml  Net 1800 ml    Exam Awake Alert,   No new F.N deficits, Normal affect [place].AT,PERRAL Supple Neck,No JVD, No cervical lymphadenopathy appriciated.  Symmetrical Chest wall movement, Good air movement bilaterally, CTAB RRR,No Gallops,Rubs or new Murmurs, No Parasternal Heave +ve B.Sounds, Abd Soft, Non tender, No organomegaly appriciated, No rebound -guarding or rigidity. No Cyanosis, Clubbing or edema, No new Rash or bruise   Data Review   CBC w Diff:  Lab Results  Component Value Date   WBC 5.5 08/17/2019   HGB 8.6 (L) 08/17/2019   HCT 28.0 (L) 08/17/2019   PLT 320 08/17/2019   LYMPHOPCT 9 08/17/2019   MONOPCT 5 08/17/2019   EOSPCT 2 08/17/2019   BASOPCT 0 08/17/2019    CMP:  Lab Results  Component Value Date   NA 135 08/17/2019   K 3.8 08/17/2019   CL 96 (L) 08/17/2019   CO2 27 08/17/2019   BUN 46 (H) 08/17/2019   CREATININE 1.52 (H) 08/17/2019   PROT 4.6 (L) 08/17/2019   ALBUMIN 2.0 (L) 08/17/2019   BILITOT 0.6 08/17/2019   ALKPHOS 71 08/17/2019   AST 14 (L) 08/17/2019   ALT 24 08/17/2019  .   Total Time in preparing paper work, data evaluation and todays exam - 65 minutes  Lala Lund M.D on 08/17/2019 at 9:04 AM  [facility]   Office  [phone]

## 2019-08-17 NOTE — TOC Progression Note (Signed)
Transition of Care [facility]) - Progression Note    Patient Details  Name: Amber Ballard MRN: [id number] Date of Birth: 28-Aug-1931  Transition of Care [facility]) CM/SW [place], LCSW Phone Number: 08/17/2019, 3:04 PM  Clinical Narrative:    CSW spoke with patient's daughter Amber Ballard and she states that she would like for the patient to go to SNF.   PT worked with patient and their recommendations have changed from the initial [place].   CSW received permission to send patient's information to SNF.   Bed offered and accepted at [facility]. Insurance authorization initiated with [employer].   Dispo is SNF pending insurance auth. Hopeful for discharge on 1/13.     Expected Discharge Plan: [place]    Expected Discharge Plan and Services Expected Discharge Plan: [place] Choice: [place]   Expected Discharge Date: 08/17/19                                     Social Determinants of Health (SDOH) Interventions    Readmission Risk Interventions No flowsheet data found.

## 2019-08-18 DIAGNOSIS — R0689 Other abnormalities of breathing: Secondary | ICD-10-CM | POA: Diagnosis not present

## 2019-08-18 DIAGNOSIS — E876 Hypokalemia: Secondary | ICD-10-CM | POA: Diagnosis not present

## 2019-08-18 DIAGNOSIS — I5033 Acute on chronic diastolic (congestive) heart failure: Secondary | ICD-10-CM | POA: Diagnosis not present

## 2019-08-18 DIAGNOSIS — I10 Essential (primary) hypertension: Secondary | ICD-10-CM | POA: Diagnosis not present

## 2019-08-18 DIAGNOSIS — N179 Acute kidney failure, unspecified: Secondary | ICD-10-CM | POA: Diagnosis not present

## 2019-08-18 DIAGNOSIS — Z66 Do not resuscitate: Secondary | ICD-10-CM | POA: Diagnosis not present

## 2019-08-18 DIAGNOSIS — Z79899 Other long term (current) drug therapy: Secondary | ICD-10-CM | POA: Diagnosis not present

## 2019-08-18 DIAGNOSIS — D519 Vitamin B12 deficiency anemia, unspecified: Secondary | ICD-10-CM | POA: Diagnosis not present

## 2019-08-18 DIAGNOSIS — F09 Unspecified mental disorder due to known physiological condition: Secondary | ICD-10-CM | POA: Diagnosis not present

## 2019-08-18 DIAGNOSIS — E785 Hyperlipidemia, unspecified: Secondary | ICD-10-CM | POA: Diagnosis not present

## 2019-08-18 DIAGNOSIS — U071 COVID-19: Principal | ICD-10-CM

## 2019-08-18 DIAGNOSIS — J9601 Acute respiratory failure with hypoxia: Secondary | ICD-10-CM | POA: Diagnosis not present

## 2019-08-18 DIAGNOSIS — R946 Abnormal results of thyroid function studies: Secondary | ICD-10-CM | POA: Diagnosis not present

## 2019-08-18 DIAGNOSIS — J1282 Pneumonia due to coronavirus disease 2019: Secondary | ICD-10-CM | POA: Diagnosis not present

## 2019-08-18 DIAGNOSIS — R279 Unspecified lack of coordination: Secondary | ICD-10-CM | POA: Diagnosis not present

## 2019-08-18 DIAGNOSIS — E119 Type 2 diabetes mellitus without complications: Secondary | ICD-10-CM | POA: Diagnosis not present

## 2019-08-18 DIAGNOSIS — I517 Cardiomegaly: Secondary | ICD-10-CM | POA: Diagnosis not present

## 2019-08-18 DIAGNOSIS — R457 State of emotional shock and stress, unspecified: Secondary | ICD-10-CM | POA: Diagnosis not present

## 2019-08-18 DIAGNOSIS — I4891 Unspecified atrial fibrillation: Secondary | ICD-10-CM | POA: Diagnosis not present

## 2019-08-18 DIAGNOSIS — Z7902 Long term (current) use of antithrombotics/antiplatelets: Secondary | ICD-10-CM | POA: Diagnosis not present

## 2019-08-18 DIAGNOSIS — R0602 Shortness of breath: Secondary | ICD-10-CM | POA: Diagnosis not present

## 2019-08-18 DIAGNOSIS — D649 Anemia, unspecified: Secondary | ICD-10-CM | POA: Diagnosis not present

## 2019-08-18 DIAGNOSIS — R262 Difficulty in walking, not elsewhere classified: Secondary | ICD-10-CM | POA: Diagnosis not present

## 2019-08-18 DIAGNOSIS — M6281 Muscle weakness (generalized): Secondary | ICD-10-CM | POA: Diagnosis not present

## 2019-08-18 DIAGNOSIS — E039 Hypothyroidism, unspecified: Secondary | ICD-10-CM | POA: Diagnosis not present

## 2019-08-18 DIAGNOSIS — I639 Cerebral infarction, unspecified: Secondary | ICD-10-CM | POA: Diagnosis not present

## 2019-08-18 DIAGNOSIS — S72001D Fracture of unspecified part of neck of right femur, subsequent encounter for closed fracture with routine healing: Secondary | ICD-10-CM | POA: Diagnosis not present

## 2019-08-18 DIAGNOSIS — R7989 Other specified abnormal findings of blood chemistry: Secondary | ICD-10-CM | POA: Diagnosis not present

## 2019-08-18 DIAGNOSIS — J9621 Acute and chronic respiratory failure with hypoxia: Secondary | ICD-10-CM | POA: Diagnosis not present

## 2019-08-18 DIAGNOSIS — J984 Other disorders of lung: Secondary | ICD-10-CM | POA: Diagnosis not present

## 2019-08-18 DIAGNOSIS — I4811 Longstanding persistent atrial fibrillation: Secondary | ICD-10-CM | POA: Diagnosis not present

## 2019-08-18 DIAGNOSIS — Z794 Long term (current) use of insulin: Secondary | ICD-10-CM | POA: Diagnosis not present

## 2019-08-18 DIAGNOSIS — R0902 Hypoxemia: Secondary | ICD-10-CM | POA: Diagnosis not present

## 2019-08-18 DIAGNOSIS — R52 Pain, unspecified: Secondary | ICD-10-CM | POA: Diagnosis not present

## 2019-08-18 DIAGNOSIS — I5032 Chronic diastolic (congestive) heart failure: Secondary | ICD-10-CM | POA: Diagnosis not present

## 2019-08-18 DIAGNOSIS — Z743 Need for continuous supervision: Secondary | ICD-10-CM | POA: Diagnosis not present

## 2019-08-18 DIAGNOSIS — E559 Vitamin D deficiency, unspecified: Secondary | ICD-10-CM | POA: Diagnosis not present

## 2019-08-18 DIAGNOSIS — I110 Hypertensive heart disease with heart failure: Secondary | ICD-10-CM | POA: Diagnosis not present

## 2019-08-18 LAB — GLUCOSE, CAPILLARY
Glucose-Capillary: 315 mg/dL — ABNORMAL HIGH (ref 70–99)
Glucose-Capillary: 239 mg/dL — ABNORMAL HIGH (ref 70–99)

## 2019-08-18 MED ORDER — INSULIN ASPART 100 UNIT/ML ~~LOC~~ SOLN: 0.0000 [IU] | Freq: Three times a day (TID) | SUBCUTANEOUS | 11 refills | Status: AC

## 2019-08-18 MED ORDER — PANTOPRAZOLE SODIUM 40 MG PO TBEC: 40.0000 mg | DELAYED_RELEASE_TABLET | Freq: Every day | ORAL | Status: AC

## 2019-08-18 NOTE — Progress Notes (Signed)
   08/18/19 1200  Family/Significant Other Communication  Family/Significant Other Update Called;Updated (daughter [name])  Called and updated daughter [name] with plan of care and discharge to SNF. Daughter would like a call from SW. Message sent.

## 2019-08-18 NOTE — Discharge Summary (Signed)
Amber Ballard KHV:[id number] DOB: [birth year] DOA: 08/13/2019  PCP: Amber Ballard., MD  Admit date: 08/13/2019  Discharge date: 08/18/2019  Is an updated discharge summary as of 08/18/2019 from initial discharge summary by Dr. Lala Lund.   Recommendations for Outpatient Follow-up:   Check BMP/CBC, 2 view CXR in 1week,  (see Discharge instructions)   PCP Please follow up on the following pending results:     Diet Recommendation: Heart Healthy Low Carb  Diet Order            Diet - low sodium heart healthy        Diet Carb Modified Fluid consistency: Thin; Room service appropriate? Yes  Diet effective now               CC - SOB   Brief history of present illness from the day of admission and additional interim summary    [name] a86 y.o.female,with past medical history of paroxysmal atrial fibrillation [place] vas 2 score of 84, not on anticoagulation due to falls, recent left hip fracture with repair about 1 month ago, hypothyroidism, hypertension, dyslipidemia, DM type II, stroke, who was diagnosed with COVID-19 infection about 10 days ago at [place]. She developed some shortness of breath and came to [facility] ER where she was diagnosed with acute hypoxic respiratory failure along with A. fib RVR and sent to [facility] for further care.                                                                    Hospital Course    1. Acute Hypoxic Resp. Failure due to Acute Covid 19 Viral Pneumonitis during the ongoing 2020 Covid 19 Pandemic - she had moderate to severe disease was started on IV steroids ( finished)  along with remdesivir, he has finished her treatment course and now symptom-free on room air for the last 2 days.    Recent Labs  Lab 08/13/19 1410 08/14/19 0500 08/15/19 0940 08/16/19 0500  08/17/19 0500  CRP 7.1* 4.6* 2.6* 1.6* 1.1*  DDIMER 2.56* 2.56* 4.87* 3.82* 3.50*  BNP 831.0* 959.7* 855.4* 1,120.1* 937.9*  PROCALCITON 0.48  --   --   --   --     Hepatic Function Latest Ref Rng & Units 08/17/2019 08/16/2019 08/15/2019  Total Protein 6.5 - 8.1 g/dL 4.6(L) 4.6(L) 5.2(L)  Albumin 3.5 - 5.0 g/dL 2.0(L) 2.0(L) 2.2(L)  AST 15 - 41 U/L 14(L) 15 23  ALT 0 - 44 U/L '24 23 27  ' Alk Phosphatase 38 - 126 U/L 71 66 75  Total Bilirubin 0.3 - 1.2 mg/dL 0.6 0.5 0.6     2.AKI on CKD 4.  Baseline creatinine around 1.6.  Post diuresis renal function close to baseline.  3.Paroxysmal A. fib. Currently in RVR. [place] vas 2  score of greater than 3. Not on anticoagulation due to fall risk, she was in RVR upon admission was continued on home dose amiodarone, beta-blocker dose has been increased and she also received few doses of IV digoxin with good results, will follow with her primary cardiologist and PCP as an outpatient  4.Hypertension. For now beta-blocker, once med rec is complete will place on appropriate medications as needed.  5.Hypothyroidism. Once med rec is done home dose Synthroid, check TSH.  6.Acute on chronic diastolic CHF. Reviewed his recent echo from [place]. EF is around 60%.  Continue diuresis withLasix at home dose and follow with PCP and primary cardiologist.  7.Dyslipidemia. Home dose statin .  8.History of CVA. No acute issues continue home regimen.  9. Recent R hip fracture - PT , OT, qualified for home health PT which has been ordered.  10.Hypokalemia.  Replaced and stable.  11. DM type II. Continue home regimen upon discharge follow with PCP for glycemic control.  Acceptable outpatient control considering her age 86.   Discharge diagnosis     Active Problems:   [id number]-[id number] virus infection    Discharge instructions    Discharge Instructions    MyChart COVID-19 home monitoring program   Complete by: Aug 17, 2019    Is the  patient willing to use the [place] for home monitoring?: Yes   Temperature monitoring   Complete by: Aug 17, 2019    After how many days would you like to receive a notification of this patient's flowsheet entries?: 1   Diet - low sodium heart healthy   Complete by: As directed    Discharge instructions   Complete by: As directed    Follow with Primary MD Amber Ballard., MD and your Cardiologist in 7 days   Get CBC, CMP, 2 view Chest X ray -  checked next visit within 1 week by Primary MD    Activity: As tolerated with Full fall precautions use walker/cane & assistance as needed  Disposition Home    Diet: Heart Healthy Low Carb  Special Instructions: If you have smoked or chewed Tobacco  in the last 2 yrs please stop smoking, stop any regular Alcohol  and or any Recreational drug use.  On your next visit with your primary care physician please Get Medicines reviewed and adjusted.  Please request your Prim.MD to go over all Hospital Tests and Procedure/Radiological results at the follow up, please get all Hospital records sent to your Prim MD by signing hospital release before you go home.  If you experience worsening of your admission symptoms, develop shortness of breath, life threatening emergency, suicidal or homicidal thoughts you must seek medical attention immediately by calling 911 or calling your MD immediately  if symptoms less severe.  You Must read complete instructions/literature along with all the possible adverse reactions/side effects for all the Medicines you take and that have been prescribed to you. Take any new Medicines after you have completely understood and accpet all the possible adverse reactions/side effects.   Increase activity slowly   Complete by: As directed       Discharge Medications   Allergies as of 08/18/2019   No Known Allergies     Medication List    TAKE these medications   acetaminophen 500 MG tablet Commonly known as:  TYLENOL Take 500 mg by mouth every 6 (six) hours as needed for mild pain.   amiodarone 200 MG tablet Commonly known as: PACERONE Take 1 tablet (200  mg total) by mouth 2 (two) times daily for 4 days, THEN 1 tablet (200 mg total) daily. Start taking on: July 31, 2019   amLODipine 10 MG tablet Commonly known as: NORVASC Take 10 mg by mouth daily.   aspirin 81 MG tablet Take 1 tablet (81 mg total) by mouth daily.   atorvastatin 80 MG tablet Commonly known as: LIPITOR Take 80 mg by mouth daily at 6 PM.   clopidogrel 75 MG tablet Commonly known as: PLAVIX Take 75 mg by mouth daily.   FIBER LAXATIVE PO Take 1 tablet by mouth daily as needed.   furosemide 40 MG tablet Commonly known as: LASIX Take 1 tablet (40 mg total) by mouth daily as needed for fluid or edema (as needed for weight gain 3 lbs in 24 hours or 5 lbs in 7 days.).   hydrALAZINE 100 MG tablet Commonly known as: APRESOLINE Take 1 tablet (100 mg total) by mouth every 8 (eight) hours.   insulin aspart 100 UNIT/ML injection Commonly known as: novoLOG Inject 0-15 Units into the skin 3 (three) times daily with meals. What changed:   how much to take  additional instructions   insulin detemir 100 UNIT/ML injection Commonly known as: LEVEMIR Inject 15 Units into the skin at bedtime.   isosorbide mononitrate 30 MG 24 hr tablet Commonly known as: IMDUR Take 30 mg by mouth daily.   levothyroxine 75 MCG tablet Commonly known as: SYNTHROID Take 75 mcg by mouth daily before breakfast.   Magnesium 400 MG Tabs Take 400 mg by mouth 2 (two) times daily.   metoprolol tartrate 100 MG tablet Commonly known as: LOPRESSOR Take 2 tablets (200 mg total) by mouth 2 (two) times daily. What changed:   medication strength  how much to take   mirtazapine 15 MG tablet Commonly known as: REMERON Take 15 mg by mouth at bedtime.   pantoprazole 40 MG tablet Commonly known as: PROTONIX Take 1 tablet (40 mg total) by  mouth daily. Start taking on: August 19, 2019   potassium chloride 10 MEQ tablet Commonly known as: KLOR-CON Take 1 tablet (10 mEq total) by mouth daily as needed (Take only with furosemide.).   senna 8.6 MG Tabs tablet Commonly known as: SENOKOT Take 2 tablets (17.2 mg total) by mouth 2 (two) times daily. What changed:   when to take this  reasons to take this   venlafaxine XR 37.5 MG 24 hr capsule Commonly known as: EFFEXOR-XR Take 37.5 mg by mouth daily.   vitamin B-12 1000 MCG tablet Commonly known as: CYANOCOBALAMIN Take 1,000 mcg by mouth daily.   Vitamin D (Ergocalciferol) 1.25 MG (50000 UNIT) Caps capsule Commonly known as: DRISDOL Take 50,000 Units by mouth every 7 (seven) days.       Follow-up Information    Amber Ballard., MD. Schedule an appointment as soon as possible for a visit in 1 week(s).   Specialty: Family Medicine Contact information: [place] [address] [phone]        Park Liter, MD. Schedule an appointment as soon as possible for a visit in 1 week(s).   Specialty: Cardiology Contact information: [place] [address] [phone]           Major procedures and Radiology Reports - PLEASE review detailed and final reports thoroughly  -       DG Chest 1 View  Result Date: 07/27/2019 CLINICAL DATA:  Pneumonia EXAM: CHEST  1 VIEW  COMPARISON:  07/24/2019 chest radiograph. FINDINGS: Stable cardiomediastinal silhouette with mild cardiomegaly. No pneumothorax. Stable small bilateral pleural effusions, left greater than right. Patchy apical right and parahilar left mid lung opacities, mildly increased. IMPRESSION: 1. Mildly increased patchy apical right in parahilar left mid lung opacities, compatible with reported pneumonia versus mild pulmonary edema. 2. Small stable bilateral pleural effusions, left greater than right. 3. Mild cardiomegaly. Electronically Signed   By: Ilona Sorrel M.D.   On:  07/27/2019 14:31   US RENAL  Result Date: 07/29/2019 CLINICAL DATA:  Acute kidney injury EXAM: RENAL / URINARY TRACT ULTRASOUND COMPLETE COMPARISON:  Ultrasound 07/26/2019 FINDINGS: Right Kidney: Renal measurements: 10.7 x 4.6 x 3.4 cm = volume: 89.3 mL. Diffusely increased echogenicity with slight loss of the corticomedullary differentiation. No visible renal calculus or hydronephrosis. No worrisome right renal lesion. Left Kidney: Renal measurements: 7.6 x 3.8 x 3.0 cm = volume: 44.7 mL. Echogenicity is within normal limits. No shadowing calculus, hydronephrosis or concerning renal mass. Bladder: Bladder is decompressed by inflated Foley catheter and therefore poorly assessed at the time of imaging. Other: None. IMPRESSION: Increased right renal cortical echogenicity and loss of corticomedullary differentiation compatible with medical renal disease. Left kidney is a more normal appearance. No other concerning renal abnormality. Bladder decompressed by inflated Foley catheter. Electronically Signed   By: Lovena Le M.D.   On: 07/29/2019 03:52   ECHOCARDIOGRAM COMPLETE  Result Date: 07/28/2019   ECHOCARDIOGRAM REPORT   Patient Name:   Amber Ballard Date of Exam: 07/28/2019 Medical Rec #:  [id number]    Height:       62.0 in Accession #:    [id number]   Weight:       140.0 lb Date of Birth:  [birth year]    BSA:          1.64 m Patient Age:    39 years     BP:           186/63 mmHg Patient Gender: F            HR:           66 bpm. Exam Location:  Inpatient Procedure: 2D Echo, Cardiac Doppler and Color Doppler Indications:    Atrial fibrillation  History:        Patient has no prior history of Echocardiogram examinations.                 CHF, Stroke, Arrythmias:Atrial Fibrillation; Risk                 Factors:Hypertension and Diabetes. Breast cancer.  Sonographer:    Dustin Flock Referring Phys: [place]  1. Left ventricular ejection fraction, by visual estimation, is 60 to 65%. The  left ventricle has normal function. There is no left ventricular hypertrophy.  2. Left ventricular diastolic parameters are consistent with Grade III diastolic dysfunction (restrictive).  3. Global right ventricle has normal systolic function.The right ventricular size is normal.  4. Left atrial size was normal.  5. Right atrial size was normal.  6. Moderate pleural effusion in the left lateral region.  7. Moderate mitral annular calcification.  8. The mitral valve is normal in structure. Mild mitral valve regurgitation. No evidence of mitral stenosis.  9. The tricuspid valve is normal in structure. 10. The aortic valve is tricuspid. Aortic valve regurgitation is not visualized. Mild aortic valve sclerosis without stenosis. 11. The pulmonic valve was normal in structure. Pulmonic valve regurgitation is not  visualized. 12. The inferior vena cava is dilated in size with >50% respiratory variability, suggesting right atrial pressure of 8 mmHg. 13. Normal LV function; restrictive filling; mild MR; left pleural effusion. 14. The left ventricle has no regional wall motion abnormalities. FINDINGS  Left Ventricle: Left ventricular ejection fraction, by visual estimation, is 60 to 65%. The left ventricle has normal function. The left ventricle has no regional wall motion abnormalities. There is no left ventricular hypertrophy. Left ventricular diastolic parameters are consistent with Grade III diastolic dysfunction (restrictive). Normal left atrial pressure. Right Ventricle: The right ventricular size is normal.Global RV systolic function is has normal systolic function. Left Atrium: Left atrial size was normal in size. Right Atrium: Right atrial size was normal in size Pericardium: There is no evidence of pericardial effusion. There is a moderate pleural effusion in the left lateral region. Mitral Valve: The mitral valve is normal in structure. Moderate mitral annular calcification. Mild mitral valve regurgitation. No  evidence of mitral valve stenosis by observation. MV peak gradient, 15.7 mmHg. Tricuspid Valve: The tricuspid valve is normal in structure. Tricuspid valve regurgitation is trivial. Aortic Valve: The aortic valve is tricuspid. Aortic valve regurgitation is not visualized. Mild aortic valve sclerosis is present, with no evidence of aortic valve stenosis. Pulmonic Valve: The pulmonic valve was normal in structure. Pulmonic valve regurgitation is not visualized. Pulmonic regurgitation is not visualized. Aorta: The aortic root is normal in size and structure. Venous: The inferior vena cava is dilated in size with greater than 50% respiratory variability, suggesting right atrial pressure of 8 mmHg.  Additional Comments: Normal LV function; restrictive filling; mild MR; left pleural effusion.  LEFT VENTRICLE PLAX 2D LVIDd:         4.70 cm Diastology LVIDs:         2.90 cm LV e' lateral:   7.94 cm/s LV PW:         1.00 cm LV E/e' lateral: 23.6 LV IVS:        1.10 cm LV e' medial:    7.94 cm/s LV SV:         70 ml   LV E/e' medial:  23.6 LV SV Index:   41.74  RIGHT VENTRICLE RV S prime:     11.10 cm/s LEFT ATRIUM         Index LA diam:    4.20 cm 2.56 cm/m   AORTA Ao Root diam: 2.50 cm MITRAL VALVE MV Area (PHT): 4.21 cm MV Peak grad:  15.7 mmHg MV Mean grad:  4.0 mmHg MV Vmax:       1.98 m/s MV Vmean:      81.1 cm/s MV VTI:        0.44 m MV PHT:        52.20 msec MV Decel Time: 180 msec MV E velocity: 187.00 cm/s 103 cm/s MV A velocity: 62.20 cm/s  70.3 cm/s MV E/A ratio:  3.01        1.5  Kirk Ruths MD Electronically signed by Kirk Ruths MD Signature Date/Time: 07/28/2019/12:10:49 PM    Final    [place] EKG SITE RITE  Result Date: 08/13/2019 If Site Rite image not attached, placement could not be confirmed due to current cardiac rhythm.   Micro Results     No results found for this or any previous visit (from the past 240 hour(s)).  Today   Subjective    Amber Ballard today has no headache,no chest  abdominal pain,no new weakness tingling or  numbness, feels much better  today.     Objective   Blood pressure (!) 153/73, pulse 86, temperature 97.8 F (36.6 C), temperature source Oral, resp. rate 19, SpO2 97 %.   Intake/Output Summary (Last 24 hours) at 08/18/2019 1146 Last data filed at 08/18/2019 0600 Gross per 24 hour  Intake --  Output 700 ml  Net -700 ml    Exam Awake Alert,   No new F.N deficits, Normal affect [place].AT,PERRAL Supple Neck,No JVD, No cervical lymphadenopathy appriciated.  Symmetrical Chest wall movement, Good air movement bilaterally, CTAB RRR,No Gallops,Rubs or new Murmurs, No Parasternal Heave +ve B.Sounds, Abd Soft, Non tender, No organomegaly appriciated, No rebound -guarding or rigidity. No Cyanosis, Clubbing or edema, No new Rash or bruise   Data Review   CBC w Diff:  Lab Results  Component Value Date   WBC 5.5 08/17/2019   HGB 8.6 (L) 08/17/2019   HCT 28.0 (L) 08/17/2019   PLT 320 08/17/2019   LYMPHOPCT 9 08/17/2019   MONOPCT 5 08/17/2019   EOSPCT 2 08/17/2019   BASOPCT 0 08/17/2019    CMP:  Lab Results  Component Value Date   NA 135 08/17/2019   K 3.8 08/17/2019   CL 96 (L) 08/17/2019   CO2 27 08/17/2019   BUN 46 (H) 08/17/2019   CREATININE 1.52 (H) 08/17/2019   PROT 4.6 (L) 08/17/2019   ALBUMIN 2.0 (L) 08/17/2019   BILITOT 0.6 08/17/2019   ALKPHOS 71 08/17/2019   AST 14 (L) 08/17/2019   ALT 24 08/17/2019  .  No CHARGE  Phillips Climes M.D on 08/18/2019 at 11:46 AM  [facility]   Office  [phone]

## 2019-08-18 NOTE — TOC Transition Note (Signed)
Transition of Care [facility]) - CM/SW Discharge Note   Patient Details  Name: Amber Ballard MRN: [id number] Date of Birth: Jan 31, 1932  Transition of Care [facility]: [facility] Rhonda W) CM/SW Contact:  Amador Cunas, [place] Phone Number: 08/18/2019, 12:30 PM   Clinical Narrative:   Pt for dc to [facility] today. Spoke to [place] in [facility] admissions who confirmed they are prepared to admit pt today. Pt's dtr, [name], aware of dc and reports agreeable. Contact information for [facility] provided to pt's dtr. RN to call report to [phone]. SW signing off at d/c.   Wandra Feinstein, MSW, LCSW [phone] (GV coverage)       Final next level of care: Skilled Nursing Facility Barriers to Discharge: No Barriers Identified   Patient Goals and CMS Choice   CMS Medicare.gov Compare Post Acute Care list provided to:: Patient Represenative (must comment) Choice offered to / list presented to : Adult Children  Discharge Placement              Patient chooses bed at: [facility] Patient to be transferred to facility by: [place] Name of family member notified: [name], dtr Patient and family notified of of transfer: 08/18/19  Discharge Plan and Services     Post Acute Care Choice: [place]                               Social Determinants of Health (SDOH) Interventions     Readmission Risk Interventions No flowsheet data found.

## 2019-08-18 NOTE — Clinical Social Work Note (Signed)
Auth obtained for SNF. Auth ID # [id number] for [place]. Ok to [employer] today.

## 2019-08-18 NOTE — Plan of Care (Signed)
  Problem: Coping: Goal: Psychosocial and spiritual needs will be supported Outcome: Adequate for Discharge   Problem: Education: Goal: Knowledge of risk factors and measures for prevention of condition will improve Outcome: Adequate for Discharge

## 2019-08-18 NOTE — Progress Notes (Signed)
Occupational Therapy Treatment Patient Details Name: Amber Ballard MRN: [id number] DOB: 04/25/1932 Today's Date: 08/18/2019    History of present illness 86 y.o. female, with past medical history of paroxysmal atrial fibrillation [place] vas 2 score of 3, not on anticoagulation due to falls, recent left hip fracture with repair about 1 month ago, hypothyroidism, hypertension, dyslipidemia, DM type II, stroke, who was diagnosed with COVID-19 infection about 10 days ago at [place].  She developed some shortness of breath and came to [facility] ER where she was diagnosed with acute hypoxic respiratory failure and sent to [facility] for further care.  She was also found to have A. fib RVR, AKI during her stay at [facility] ER.   OT comments  Pt progressing towards OT goals this session, abel to complete BSC transfer with mod A for boost, min A for balance, mod A for peri care, set up for grooming tasks. Pt on RA throughout session SpO2 >92%, no SOB or DOE. DC updated  To SNF and frequency adjusted per OT protocol. OT will continue to follow acutely.    Follow Up Recommendations  SNF    Equipment Recommendations  Other (comment)(defer to next venue of care)    Recommendations for Other Services      Precautions / Restrictions Precautions Precautions: Fall Precaution Comments: recent fall with right hip fx s/p surgery Restrictions Weight Bearing Restrictions: No Other Position/Activity Restrictions: Pt reports WBAT since R hip sx 06/2019       Mobility Bed Mobility Overal bed mobility: Needs Assistance Bed Mobility: Supine to Sit     Supine to sit: Mod assist     General bed mobility comments: vc for sequencing and to use bed rails to assist; assist for trunk and to scoot forward  Transfers Overall transfer level: Needs assistance Equipment used: Rolling walker (2 wheeled);1 person hand held assist Transfers: Sit to/from [facility] Sit to Stand: Mod assist Stand pivot  transfers: Min assist       General transfer comment: mod A for boost, min A once on feet    Balance Overall balance assessment: Needs assistance;History of Falls Sitting-balance support: Feet supported;Bilateral upper extremity supported Sitting balance-Leahy Scale: Fair     Standing balance support: During functional activity;Bilateral upper extremity supported Standing balance-Leahy Scale: Poor Standing balance comment: needs UE support                           ADL either performed or assessed with clinical judgement   ADL Overall ADL's : Needs assistance/impaired     Grooming: Wash/dry hands;Wash/dry face;Brushing hair;Sitting;Min guard Grooming Details (indicate cue type and reason): cues for some tasks             Lower Body Dressing: Moderate assistance;Sit to/from stand;Sitting/lateral leans Lower Body Dressing Details (indicate cue type and reason): assist to don socks Toilet Transfer: Moderate assistance;BSC;Stand-pivot(face to face transfer) Toilet Transfer Details (indicate cue type and reason): mod A for boost from low bed, min A once on feet Toileting- Clothing Manipulation and Hygiene: Moderate assistance;Sit to/from stand;Sitting/lateral lean       Functional mobility during ADLs: Moderate assistance;Rolling walker       Vision       Perception     Praxis      Cognition Arousal/Alertness: Awake/alert Behavior During Therapy: WFL for tasks assessed/performed Overall Cognitive Status: No family/caregiver present to determine baseline cognitive functioning Area of Impairment: Attention;Memory;Awareness;Problem solving  Current Attention Level: Sustained;Selective Memory: Decreased short-term memory     Awareness: Emergent Problem Solving: Requires verbal cues General Comments: Likely baseline cognition exacerbated by [facility]        Exercises     Shoulder Instructions       General Comments on RA  throughout session, no SOB, no DOE    Pertinent Vitals/ Pain       Pain Assessment: No/denies pain  Home Living                                          Prior Functioning/Environment              Frequency  Min 2X/week        Progress Toward Goals  OT Goals(current goals can now be found in the care plan section)  Progress towards OT goals: Progressing toward goals  Acute Rehab OT Goals Patient Stated Goal: get better Time For Goal Achievement: 08/29/19 Potential to Achieve Goals: Good  Plan Discharge plan needs to be updated;Frequency needs to be updated    Co-evaluation                 AM-PAC OT "6 Clicks" Daily Activity     Outcome Measure   Help from another person eating meals?: None Help from another person taking care of personal grooming?: A Little Help from another person toileting, which includes using toliet, bedpan, or urinal?: A Lot Help from another person bathing (including washing, rinsing, drying)?: A Lot Help from another person to put on and taking off regular upper body clothing?: A Little Help from another person to put on and taking off regular lower body clothing?: A Lot 6 Click Score: 16    End of Session Equipment Utilized During Treatment: Rolling walker  OT Visit Diagnosis: Unsteadiness on feet (R26.81);Muscle weakness (generalized) (M62.81)   Activity Tolerance Patient tolerated treatment well   Patient Left in chair;with call bell/phone within reach;with chair alarm set   Nurse Communication Mobility status        Time: [phone] OT Time Calculation (min): 29 min  Charges: OT General Charges $OT Visit: 1 Visit OT Treatments $Self Care/Home Management : 23-37 mins  [place] Pager: [phone] Office: [phone]   Mickel Baas  08/18/2019, 12:21 PM

## 2019-08-18 NOTE — Progress Notes (Signed)
Patient discharged. Midline removed, occlusive dressing applied. Patient belongings gathered and sent with patient. Patient transported on stretcher with PTAR.

## 2019-08-19 ENCOUNTER — Ambulatory Visit: Payer: PPO | Admitting: Cardiology

## 2019-08-21 DIAGNOSIS — U071 COVID-19: Secondary | ICD-10-CM | POA: Diagnosis not present

## 2019-08-21 DIAGNOSIS — I639 Cerebral infarction, unspecified: Secondary | ICD-10-CM | POA: Diagnosis not present

## 2019-08-21 DIAGNOSIS — E039 Hypothyroidism, unspecified: Secondary | ICD-10-CM | POA: Diagnosis not present

## 2019-08-21 DIAGNOSIS — S72001D Fracture of unspecified part of neck of right femur, subsequent encounter for closed fracture with routine healing: Secondary | ICD-10-CM | POA: Diagnosis not present

## 2019-08-31 DIAGNOSIS — R296 Repeated falls: Secondary | ICD-10-CM | POA: Diagnosis not present

## 2019-08-31 DIAGNOSIS — E1165 Type 2 diabetes mellitus with hyperglycemia: Secondary | ICD-10-CM | POA: Diagnosis not present

## 2019-08-31 DIAGNOSIS — I110 Hypertensive heart disease with heart failure: Secondary | ICD-10-CM | POA: Diagnosis not present

## 2019-08-31 DIAGNOSIS — E1110 Type 2 diabetes mellitus with ketoacidosis without coma: Secondary | ICD-10-CM | POA: Diagnosis not present

## 2019-08-31 DIAGNOSIS — Z7902 Long term (current) use of antithrombotics/antiplatelets: Secondary | ICD-10-CM | POA: Diagnosis not present

## 2019-08-31 DIAGNOSIS — E1122 Type 2 diabetes mellitus with diabetic chronic kidney disease: Secondary | ICD-10-CM | POA: Diagnosis not present

## 2019-08-31 DIAGNOSIS — R5381 Other malaise: Secondary | ICD-10-CM | POA: Diagnosis not present

## 2019-08-31 DIAGNOSIS — J90 Pleural effusion, not elsewhere classified: Secondary | ICD-10-CM | POA: Diagnosis not present

## 2019-08-31 DIAGNOSIS — Z853 Personal history of malignant neoplasm of breast: Secondary | ICD-10-CM | POA: Diagnosis not present

## 2019-08-31 DIAGNOSIS — Z7982 Long term (current) use of aspirin: Secondary | ICD-10-CM | POA: Diagnosis not present

## 2019-08-31 DIAGNOSIS — E11649 Type 2 diabetes mellitus with hypoglycemia without coma: Secondary | ICD-10-CM | POA: Diagnosis not present

## 2019-08-31 DIAGNOSIS — S72001D Fracture of unspecified part of neck of right femur, subsequent encounter for closed fracture with routine healing: Secondary | ICD-10-CM | POA: Diagnosis not present

## 2019-08-31 DIAGNOSIS — Z66 Do not resuscitate: Secondary | ICD-10-CM | POA: Diagnosis not present

## 2019-08-31 DIAGNOSIS — Z794 Long term (current) use of insulin: Secondary | ICD-10-CM | POA: Diagnosis not present

## 2019-08-31 DIAGNOSIS — N179 Acute kidney failure, unspecified: Secondary | ICD-10-CM | POA: Diagnosis not present

## 2019-08-31 DIAGNOSIS — R911 Solitary pulmonary nodule: Secondary | ICD-10-CM | POA: Diagnosis not present

## 2019-08-31 DIAGNOSIS — Z96641 Presence of right artificial hip joint: Secondary | ICD-10-CM | POA: Diagnosis not present

## 2019-08-31 DIAGNOSIS — E876 Hypokalemia: Secondary | ICD-10-CM | POA: Diagnosis not present

## 2019-08-31 DIAGNOSIS — I5032 Chronic diastolic (congestive) heart failure: Secondary | ICD-10-CM | POA: Diagnosis not present

## 2019-08-31 DIAGNOSIS — I5033 Acute on chronic diastolic (congestive) heart failure: Secondary | ICD-10-CM | POA: Diagnosis not present

## 2019-08-31 DIAGNOSIS — J1282 Pneumonia due to coronavirus disease 2019: Secondary | ICD-10-CM | POA: Diagnosis not present

## 2019-08-31 DIAGNOSIS — N183 Chronic kidney disease, stage 3 unspecified: Secondary | ICD-10-CM | POA: Diagnosis not present

## 2019-08-31 DIAGNOSIS — I10 Essential (primary) hypertension: Secondary | ICD-10-CM | POA: Diagnosis not present

## 2019-08-31 DIAGNOSIS — E119 Type 2 diabetes mellitus without complications: Secondary | ICD-10-CM | POA: Diagnosis not present

## 2019-08-31 DIAGNOSIS — R0689 Other abnormalities of breathing: Secondary | ICD-10-CM | POA: Diagnosis not present

## 2019-08-31 DIAGNOSIS — I4811 Longstanding persistent atrial fibrillation: Secondary | ICD-10-CM | POA: Diagnosis not present

## 2019-08-31 DIAGNOSIS — I249 Acute ischemic heart disease, unspecified: Secondary | ICD-10-CM | POA: Diagnosis not present

## 2019-08-31 DIAGNOSIS — E039 Hypothyroidism, unspecified: Secondary | ICD-10-CM | POA: Diagnosis not present

## 2019-08-31 DIAGNOSIS — R52 Pain, unspecified: Secondary | ICD-10-CM | POA: Diagnosis not present

## 2019-08-31 DIAGNOSIS — I639 Cerebral infarction, unspecified: Secondary | ICD-10-CM | POA: Diagnosis not present

## 2019-08-31 DIAGNOSIS — I4891 Unspecified atrial fibrillation: Secondary | ICD-10-CM | POA: Diagnosis not present

## 2019-08-31 DIAGNOSIS — E785 Hyperlipidemia, unspecified: Secondary | ICD-10-CM | POA: Diagnosis not present

## 2019-08-31 DIAGNOSIS — J189 Pneumonia, unspecified organism: Secondary | ICD-10-CM | POA: Diagnosis not present

## 2019-08-31 DIAGNOSIS — I130 Hypertensive heart and chronic kidney disease with heart failure and stage 1 through stage 4 chronic kidney disease, or unspecified chronic kidney disease: Secondary | ICD-10-CM | POA: Diagnosis not present

## 2019-08-31 DIAGNOSIS — J9600 Acute respiratory failure, unspecified whether with hypoxia or hypercapnia: Secondary | ICD-10-CM | POA: Diagnosis not present

## 2019-08-31 DIAGNOSIS — Z8673 Personal history of transient ischemic attack (TIA), and cerebral infarction without residual deficits: Secondary | ICD-10-CM | POA: Diagnosis not present

## 2019-08-31 DIAGNOSIS — R457 State of emotional shock and stress, unspecified: Secondary | ICD-10-CM | POA: Diagnosis not present

## 2019-08-31 DIAGNOSIS — Z743 Need for continuous supervision: Secondary | ICD-10-CM | POA: Diagnosis not present

## 2019-08-31 DIAGNOSIS — M1990 Unspecified osteoarthritis, unspecified site: Secondary | ICD-10-CM | POA: Diagnosis not present

## 2019-08-31 DIAGNOSIS — J9621 Acute and chronic respiratory failure with hypoxia: Secondary | ICD-10-CM | POA: Diagnosis not present

## 2019-08-31 DIAGNOSIS — F09 Unspecified mental disorder due to known physiological condition: Secondary | ICD-10-CM | POA: Diagnosis not present

## 2019-08-31 DIAGNOSIS — J9601 Acute respiratory failure with hypoxia: Secondary | ICD-10-CM | POA: Diagnosis not present

## 2019-08-31 DIAGNOSIS — U071 COVID-19: Secondary | ICD-10-CM | POA: Diagnosis not present

## 2019-08-31 DIAGNOSIS — R0602 Shortness of breath: Secondary | ICD-10-CM | POA: Diagnosis not present

## 2019-08-31 DIAGNOSIS — M6281 Muscle weakness (generalized): Secondary | ICD-10-CM | POA: Diagnosis not present

## 2019-08-31 DIAGNOSIS — R0902 Hypoxemia: Secondary | ICD-10-CM | POA: Diagnosis not present

## 2019-08-31 DIAGNOSIS — R262 Difficulty in walking, not elsewhere classified: Secondary | ICD-10-CM | POA: Diagnosis not present

## 2019-08-31 DIAGNOSIS — R69 Illness, unspecified: Secondary | ICD-10-CM | POA: Diagnosis not present

## 2019-09-07 DIAGNOSIS — E876 Hypokalemia: Secondary | ICD-10-CM | POA: Diagnosis not present

## 2019-09-07 DIAGNOSIS — Z96641 Presence of right artificial hip joint: Secondary | ICD-10-CM | POA: Diagnosis not present

## 2019-09-07 DIAGNOSIS — D649 Anemia, unspecified: Secondary | ICD-10-CM | POA: Diagnosis not present

## 2019-09-07 DIAGNOSIS — R410 Disorientation, unspecified: Secondary | ICD-10-CM | POA: Diagnosis not present

## 2019-09-07 DIAGNOSIS — I4811 Longstanding persistent atrial fibrillation: Secondary | ICD-10-CM | POA: Diagnosis not present

## 2019-09-07 DIAGNOSIS — U071 COVID-19: Secondary | ICD-10-CM | POA: Diagnosis not present

## 2019-09-07 DIAGNOSIS — E1165 Type 2 diabetes mellitus with hyperglycemia: Secondary | ICD-10-CM | POA: Diagnosis not present

## 2019-09-07 DIAGNOSIS — Z743 Need for continuous supervision: Secondary | ICD-10-CM | POA: Diagnosis not present

## 2019-09-07 DIAGNOSIS — K5641 Fecal impaction: Secondary | ICD-10-CM | POA: Diagnosis not present

## 2019-09-07 DIAGNOSIS — Z7982 Long term (current) use of aspirin: Secondary | ICD-10-CM | POA: Diagnosis not present

## 2019-09-07 DIAGNOSIS — Z7902 Long term (current) use of antithrombotics/antiplatelets: Secondary | ICD-10-CM | POA: Diagnosis not present

## 2019-09-07 DIAGNOSIS — K5900 Constipation, unspecified: Secondary | ICD-10-CM | POA: Diagnosis not present

## 2019-09-07 DIAGNOSIS — I700 Atherosclerosis of aorta: Secondary | ICD-10-CM | POA: Diagnosis not present

## 2019-09-07 DIAGNOSIS — J9621 Acute and chronic respiratory failure with hypoxia: Secondary | ICD-10-CM | POA: Diagnosis not present

## 2019-09-07 DIAGNOSIS — J1282 Pneumonia due to coronavirus disease 2019: Secondary | ICD-10-CM | POA: Diagnosis not present

## 2019-09-07 DIAGNOSIS — J90 Pleural effusion, not elsewhere classified: Secondary | ICD-10-CM | POA: Diagnosis not present

## 2019-09-07 DIAGNOSIS — N179 Acute kidney failure, unspecified: Secondary | ICD-10-CM | POA: Diagnosis not present

## 2019-09-07 DIAGNOSIS — I959 Hypotension, unspecified: Secondary | ICD-10-CM | POA: Diagnosis not present

## 2019-09-07 DIAGNOSIS — E785 Hyperlipidemia, unspecified: Secondary | ICD-10-CM | POA: Diagnosis not present

## 2019-09-07 DIAGNOSIS — I249 Acute ischemic heart disease, unspecified: Secondary | ICD-10-CM | POA: Diagnosis not present

## 2019-09-07 DIAGNOSIS — I4891 Unspecified atrial fibrillation: Secondary | ICD-10-CM | POA: Diagnosis not present

## 2019-09-07 DIAGNOSIS — R58 Hemorrhage, not elsewhere classified: Secondary | ICD-10-CM | POA: Diagnosis not present

## 2019-09-07 DIAGNOSIS — Z853 Personal history of malignant neoplasm of breast: Secondary | ICD-10-CM | POA: Diagnosis not present

## 2019-09-07 DIAGNOSIS — R5381 Other malaise: Secondary | ICD-10-CM | POA: Diagnosis not present

## 2019-09-07 DIAGNOSIS — K921 Melena: Secondary | ICD-10-CM | POA: Diagnosis not present

## 2019-09-07 DIAGNOSIS — E1110 Type 2 diabetes mellitus with ketoacidosis without coma: Secondary | ICD-10-CM | POA: Diagnosis not present

## 2019-09-07 DIAGNOSIS — M1990 Unspecified osteoarthritis, unspecified site: Secondary | ICD-10-CM | POA: Diagnosis not present

## 2019-09-07 DIAGNOSIS — J9601 Acute respiratory failure with hypoxia: Secondary | ICD-10-CM | POA: Diagnosis not present

## 2019-09-07 DIAGNOSIS — I82432 Acute embolism and thrombosis of left popliteal vein: Secondary | ICD-10-CM | POA: Diagnosis not present

## 2019-09-07 DIAGNOSIS — F09 Unspecified mental disorder due to known physiological condition: Secondary | ICD-10-CM | POA: Diagnosis not present

## 2019-09-07 DIAGNOSIS — N183 Chronic kidney disease, stage 3 unspecified: Secondary | ICD-10-CM | POA: Diagnosis not present

## 2019-09-07 DIAGNOSIS — I5033 Acute on chronic diastolic (congestive) heart failure: Secondary | ICD-10-CM | POA: Diagnosis not present

## 2019-09-07 DIAGNOSIS — K922 Gastrointestinal hemorrhage, unspecified: Secondary | ICD-10-CM | POA: Diagnosis not present

## 2019-09-07 DIAGNOSIS — R911 Solitary pulmonary nodule: Secondary | ICD-10-CM | POA: Diagnosis not present

## 2019-09-07 DIAGNOSIS — R69 Illness, unspecified: Secondary | ICD-10-CM | POA: Diagnosis not present

## 2019-09-07 DIAGNOSIS — S72001D Fracture of unspecified part of neck of right femur, subsequent encounter for closed fracture with routine healing: Secondary | ICD-10-CM | POA: Diagnosis not present

## 2019-09-07 DIAGNOSIS — E039 Hypothyroidism, unspecified: Secondary | ICD-10-CM | POA: Diagnosis not present

## 2019-09-07 DIAGNOSIS — Z8673 Personal history of transient ischemic attack (TIA), and cerebral infarction without residual deficits: Secondary | ICD-10-CM | POA: Diagnosis not present

## 2019-09-07 DIAGNOSIS — E11649 Type 2 diabetes mellitus with hypoglycemia without coma: Secondary | ICD-10-CM | POA: Diagnosis not present

## 2019-09-07 DIAGNOSIS — E119 Type 2 diabetes mellitus without complications: Secondary | ICD-10-CM | POA: Diagnosis not present

## 2019-09-07 DIAGNOSIS — J189 Pneumonia, unspecified organism: Secondary | ICD-10-CM | POA: Diagnosis not present

## 2019-09-07 DIAGNOSIS — M6281 Muscle weakness (generalized): Secondary | ICD-10-CM | POA: Diagnosis not present

## 2019-09-07 DIAGNOSIS — R296 Repeated falls: Secondary | ICD-10-CM | POA: Diagnosis not present

## 2019-09-07 DIAGNOSIS — D6832 Hemorrhagic disorder due to extrinsic circulating anticoagulants: Secondary | ICD-10-CM | POA: Diagnosis not present

## 2019-09-07 DIAGNOSIS — I10 Essential (primary) hypertension: Secondary | ICD-10-CM | POA: Diagnosis not present

## 2019-09-07 DIAGNOSIS — E1122 Type 2 diabetes mellitus with diabetic chronic kidney disease: Secondary | ICD-10-CM | POA: Diagnosis not present

## 2019-09-07 DIAGNOSIS — I639 Cerebral infarction, unspecified: Secondary | ICD-10-CM | POA: Diagnosis not present

## 2019-09-07 DIAGNOSIS — I5032 Chronic diastolic (congestive) heart failure: Secondary | ICD-10-CM | POA: Diagnosis not present

## 2019-09-07 DIAGNOSIS — I130 Hypertensive heart and chronic kidney disease with heart failure and stage 1 through stage 4 chronic kidney disease, or unspecified chronic kidney disease: Secondary | ICD-10-CM | POA: Diagnosis not present

## 2019-09-07 DIAGNOSIS — R262 Difficulty in walking, not elsewhere classified: Secondary | ICD-10-CM | POA: Diagnosis not present

## 2019-09-13 DIAGNOSIS — U071 COVID-19: Secondary | ICD-10-CM | POA: Diagnosis not present

## 2019-09-13 DIAGNOSIS — E119 Type 2 diabetes mellitus without complications: Secondary | ICD-10-CM | POA: Diagnosis not present

## 2019-09-13 DIAGNOSIS — I4811 Longstanding persistent atrial fibrillation: Secondary | ICD-10-CM | POA: Diagnosis not present

## 2019-09-14 ENCOUNTER — Other Ambulatory Visit: Payer: Self-pay

## 2019-09-20 DIAGNOSIS — K5641 Fecal impaction: Secondary | ICD-10-CM | POA: Diagnosis not present

## 2019-09-20 DIAGNOSIS — K5900 Constipation, unspecified: Secondary | ICD-10-CM | POA: Diagnosis not present

## 2019-09-20 DIAGNOSIS — D6832 Hemorrhagic disorder due to extrinsic circulating anticoagulants: Secondary | ICD-10-CM | POA: Diagnosis not present

## 2019-09-20 DIAGNOSIS — K921 Melena: Secondary | ICD-10-CM | POA: Diagnosis not present

## 2019-09-20 DIAGNOSIS — I700 Atherosclerosis of aorta: Secondary | ICD-10-CM | POA: Diagnosis not present

## 2019-09-21 DIAGNOSIS — I4891 Unspecified atrial fibrillation: Secondary | ICD-10-CM | POA: Diagnosis not present

## 2019-09-21 DIAGNOSIS — D500 Iron deficiency anemia secondary to blood loss (chronic): Secondary | ICD-10-CM | POA: Diagnosis not present

## 2019-09-21 DIAGNOSIS — K922 Gastrointestinal hemorrhage, unspecified: Secondary | ICD-10-CM | POA: Diagnosis not present

## 2019-09-22 DIAGNOSIS — T45515A Adverse effect of anticoagulants, initial encounter: Secondary | ICD-10-CM | POA: Diagnosis not present

## 2019-09-22 DIAGNOSIS — I4821 Permanent atrial fibrillation: Secondary | ICD-10-CM | POA: Diagnosis not present

## 2019-09-22 DIAGNOSIS — D6832 Hemorrhagic disorder due to extrinsic circulating anticoagulants: Secondary | ICD-10-CM | POA: Diagnosis not present

## 2019-09-22 DIAGNOSIS — I5032 Chronic diastolic (congestive) heart failure: Secondary | ICD-10-CM | POA: Diagnosis not present

## 2019-09-22 DIAGNOSIS — Z8673 Personal history of transient ischemic attack (TIA), and cerebral infarction without residual deficits: Secondary | ICD-10-CM | POA: Diagnosis not present

## 2019-09-22 DIAGNOSIS — K922 Gastrointestinal hemorrhage, unspecified: Secondary | ICD-10-CM | POA: Diagnosis not present

## 2019-09-22 DIAGNOSIS — I4891 Unspecified atrial fibrillation: Secondary | ICD-10-CM | POA: Diagnosis not present

## 2019-09-22 DIAGNOSIS — D62 Acute posthemorrhagic anemia: Secondary | ICD-10-CM | POA: Diagnosis not present

## 2019-09-22 DIAGNOSIS — U071 COVID-19: Secondary | ICD-10-CM | POA: Diagnosis not present

## 2019-09-22 DIAGNOSIS — I82402 Acute embolism and thrombosis of unspecified deep veins of left lower extremity: Secondary | ICD-10-CM | POA: Diagnosis not present

## 2019-09-22 DIAGNOSIS — Z66 Do not resuscitate: Secondary | ICD-10-CM | POA: Diagnosis not present

## 2019-09-22 DIAGNOSIS — D500 Iron deficiency anemia secondary to blood loss (chronic): Secondary | ICD-10-CM | POA: Diagnosis not present

## 2019-09-22 DIAGNOSIS — K921 Melena: Secondary | ICD-10-CM | POA: Diagnosis not present

## 2019-09-22 DIAGNOSIS — K5900 Constipation, unspecified: Secondary | ICD-10-CM | POA: Diagnosis not present

## 2019-09-22 DIAGNOSIS — Z794 Long term (current) use of insulin: Secondary | ICD-10-CM | POA: Diagnosis not present

## 2019-09-22 DIAGNOSIS — I110 Hypertensive heart disease with heart failure: Secondary | ICD-10-CM | POA: Diagnosis not present

## 2019-09-22 DIAGNOSIS — E119 Type 2 diabetes mellitus without complications: Secondary | ICD-10-CM | POA: Diagnosis not present

## 2019-09-22 DIAGNOSIS — Z7902 Long term (current) use of antithrombotics/antiplatelets: Secondary | ICD-10-CM | POA: Diagnosis not present

## 2019-09-25 DIAGNOSIS — R5381 Other malaise: Secondary | ICD-10-CM | POA: Diagnosis not present

## 2019-09-25 DIAGNOSIS — I82402 Acute embolism and thrombosis of unspecified deep veins of left lower extremity: Secondary | ICD-10-CM | POA: Diagnosis not present

## 2019-09-25 DIAGNOSIS — D62 Acute posthemorrhagic anemia: Secondary | ICD-10-CM | POA: Diagnosis not present

## 2019-09-25 DIAGNOSIS — R279 Unspecified lack of coordination: Secondary | ICD-10-CM | POA: Diagnosis not present

## 2019-09-25 DIAGNOSIS — F0390 Unspecified dementia without behavioral disturbance: Secondary | ICD-10-CM | POA: Diagnosis not present

## 2019-09-25 DIAGNOSIS — J9621 Acute and chronic respiratory failure with hypoxia: Secondary | ICD-10-CM | POA: Diagnosis not present

## 2019-09-25 DIAGNOSIS — I82432 Acute embolism and thrombosis of left popliteal vein: Secondary | ICD-10-CM | POA: Diagnosis not present

## 2019-09-25 DIAGNOSIS — K921 Melena: Secondary | ICD-10-CM | POA: Diagnosis not present

## 2019-09-25 DIAGNOSIS — L89153 Pressure ulcer of sacral region, stage 3: Secondary | ICD-10-CM | POA: Diagnosis not present

## 2019-09-25 DIAGNOSIS — N179 Acute kidney failure, unspecified: Secondary | ICD-10-CM | POA: Diagnosis not present

## 2019-09-25 DIAGNOSIS — E876 Hypokalemia: Secondary | ICD-10-CM | POA: Diagnosis not present

## 2019-09-25 DIAGNOSIS — R110 Nausea: Secondary | ICD-10-CM | POA: Diagnosis not present

## 2019-09-25 DIAGNOSIS — Z8673 Personal history of transient ischemic attack (TIA), and cerebral infarction without residual deficits: Secondary | ICD-10-CM | POA: Diagnosis not present

## 2019-09-25 DIAGNOSIS — E785 Hyperlipidemia, unspecified: Secondary | ICD-10-CM | POA: Diagnosis not present

## 2019-09-25 DIAGNOSIS — I471 Supraventricular tachycardia: Secondary | ICD-10-CM | POA: Diagnosis not present

## 2019-09-25 DIAGNOSIS — E119 Type 2 diabetes mellitus without complications: Secondary | ICD-10-CM | POA: Diagnosis not present

## 2019-09-25 DIAGNOSIS — I82412 Acute embolism and thrombosis of left femoral vein: Secondary | ICD-10-CM | POA: Diagnosis not present

## 2019-09-25 DIAGNOSIS — E11649 Type 2 diabetes mellitus with hypoglycemia without coma: Secondary | ICD-10-CM | POA: Diagnosis not present

## 2019-09-25 DIAGNOSIS — Z86718 Personal history of other venous thrombosis and embolism: Secondary | ICD-10-CM | POA: Diagnosis not present

## 2019-09-25 DIAGNOSIS — I639 Cerebral infarction, unspecified: Secondary | ICD-10-CM | POA: Diagnosis not present

## 2019-09-25 DIAGNOSIS — I10 Essential (primary) hypertension: Secondary | ICD-10-CM | POA: Diagnosis not present

## 2019-09-25 DIAGNOSIS — K922 Gastrointestinal hemorrhage, unspecified: Secondary | ICD-10-CM | POA: Diagnosis not present

## 2019-09-25 DIAGNOSIS — I4510 Unspecified right bundle-branch block: Secondary | ICD-10-CM | POA: Diagnosis not present

## 2019-09-25 DIAGNOSIS — I4891 Unspecified atrial fibrillation: Secondary | ICD-10-CM | POA: Diagnosis not present

## 2019-09-25 DIAGNOSIS — S72001D Fracture of unspecified part of neck of right femur, subsequent encounter for closed fracture with routine healing: Secondary | ICD-10-CM | POA: Diagnosis not present

## 2019-09-25 DIAGNOSIS — R262 Difficulty in walking, not elsewhere classified: Secondary | ICD-10-CM | POA: Diagnosis not present

## 2019-09-25 DIAGNOSIS — T8131XA Disruption of external operation (surgical) wound, not elsewhere classified, initial encounter: Secondary | ICD-10-CM | POA: Diagnosis not present

## 2019-09-25 DIAGNOSIS — I82409 Acute embolism and thrombosis of unspecified deep veins of unspecified lower extremity: Secondary | ICD-10-CM | POA: Diagnosis not present

## 2019-09-25 DIAGNOSIS — F09 Unspecified mental disorder due to known physiological condition: Secondary | ICD-10-CM | POA: Diagnosis not present

## 2019-09-25 DIAGNOSIS — I4811 Longstanding persistent atrial fibrillation: Secondary | ICD-10-CM | POA: Diagnosis not present

## 2019-09-25 DIAGNOSIS — E039 Hypothyroidism, unspecified: Secondary | ICD-10-CM | POA: Diagnosis not present

## 2019-09-25 DIAGNOSIS — I5033 Acute on chronic diastolic (congestive) heart failure: Secondary | ICD-10-CM | POA: Diagnosis not present

## 2019-09-25 DIAGNOSIS — Z66 Do not resuscitate: Secondary | ICD-10-CM | POA: Diagnosis not present

## 2019-09-25 DIAGNOSIS — M6281 Muscle weakness (generalized): Secondary | ICD-10-CM | POA: Diagnosis not present

## 2019-09-25 DIAGNOSIS — Z8616 Personal history of COVID-19: Secondary | ICD-10-CM | POA: Diagnosis not present

## 2019-09-25 DIAGNOSIS — D649 Anemia, unspecified: Secondary | ICD-10-CM | POA: Diagnosis not present

## 2019-09-25 DIAGNOSIS — Z7902 Long term (current) use of antithrombotics/antiplatelets: Secondary | ICD-10-CM | POA: Diagnosis not present

## 2019-09-25 DIAGNOSIS — Z794 Long term (current) use of insulin: Secondary | ICD-10-CM | POA: Diagnosis not present

## 2019-09-29 MED ORDER — GENERIC EXTERNAL MEDICATION
75.00 | Status: DC
Start: 2019-10-01 — End: 2019-09-29

## 2019-09-29 MED ORDER — ATORVASTATIN CALCIUM 80 MG PO TABS
80.00 | ORAL_TABLET | ORAL | Status: DC
Start: 2019-10-01 — End: 2019-09-29

## 2019-09-29 MED ORDER — VENLAFAXINE HCL ER 37.5 MG PO CP24
37.50 | ORAL_CAPSULE | ORAL | Status: DC
Start: 2019-10-01 — End: 2019-09-29

## 2019-09-29 MED ORDER — AMIODARONE HCL 200 MG PO TABS
200.00 | ORAL_TABLET | ORAL | Status: DC
Start: 2019-10-01 — End: 2019-09-29

## 2019-09-29 MED ORDER — ACETAMINOPHEN 325 MG PO TABS
650.00 | ORAL_TABLET | ORAL | Status: DC
Start: ? — End: 2019-09-29

## 2019-09-29 MED ORDER — METOPROLOL TARTRATE 100 MG PO TABS
100.00 | ORAL_TABLET | ORAL | Status: DC
Start: 2019-09-30 — End: 2019-09-29

## 2019-09-29 MED ORDER — ALUM & MAG HYDROXIDE-SIMETH 400-400-40 MG/5ML PO SUSP
30.00 | ORAL | Status: DC
Start: ? — End: 2019-09-29

## 2019-09-29 MED ORDER — SENNOSIDES 8.6 MG PO TABS
2.00 | ORAL_TABLET | ORAL | Status: DC
Start: ? — End: 2019-09-29

## 2019-09-29 MED ORDER — APIXABAN 2.5 MG PO TABS
2.50 | ORAL_TABLET | ORAL | Status: DC
Start: 2019-09-30 — End: 2019-09-29

## 2019-09-29 MED ORDER — GENERIC EXTERNAL MEDICATION
Status: DC
Start: ? — End: 2019-09-29

## 2019-09-29 MED ORDER — MELATONIN 3 MG PO TABS
3.00 | ORAL_TABLET | ORAL | Status: DC
Start: ? — End: 2019-09-29

## 2019-09-29 MED ORDER — DEXTROSE 50 % IV SOLN
25.00 | INTRAVENOUS | Status: DC
Start: ? — End: 2019-09-29

## 2019-09-29 MED ORDER — AMLODIPINE BESYLATE 5 MG PO TABS
5.00 | ORAL_TABLET | ORAL | Status: DC
Start: 2019-10-01 — End: 2019-09-29

## 2019-09-29 MED ORDER — GUAIFENESIN 100 MG/5ML PO SYRP
200.00 | ORAL_SOLUTION | ORAL | Status: DC
Start: ? — End: 2019-09-29

## 2019-09-29 MED ORDER — INSULIN LISPRO 100 UNIT/ML ~~LOC~~ SOLN
0.00 | SUBCUTANEOUS | Status: DC
Start: 2019-09-29 — End: 2019-09-29

## 2019-09-30 DIAGNOSIS — J9621 Acute and chronic respiratory failure with hypoxia: Secondary | ICD-10-CM | POA: Diagnosis not present

## 2019-09-30 DIAGNOSIS — E785 Hyperlipidemia, unspecified: Secondary | ICD-10-CM | POA: Diagnosis not present

## 2019-09-30 DIAGNOSIS — E876 Hypokalemia: Secondary | ICD-10-CM | POA: Diagnosis not present

## 2019-09-30 DIAGNOSIS — R279 Unspecified lack of coordination: Secondary | ICD-10-CM | POA: Diagnosis not present

## 2019-09-30 DIAGNOSIS — M6281 Muscle weakness (generalized): Secondary | ICD-10-CM | POA: Diagnosis not present

## 2019-09-30 DIAGNOSIS — R5381 Other malaise: Secondary | ICD-10-CM | POA: Diagnosis not present

## 2019-09-30 DIAGNOSIS — R262 Difficulty in walking, not elsewhere classified: Secondary | ICD-10-CM | POA: Diagnosis not present

## 2019-09-30 DIAGNOSIS — I4811 Longstanding persistent atrial fibrillation: Secondary | ICD-10-CM | POA: Diagnosis not present

## 2019-09-30 DIAGNOSIS — F09 Unspecified mental disorder due to known physiological condition: Secondary | ICD-10-CM | POA: Diagnosis not present

## 2019-09-30 DIAGNOSIS — N179 Acute kidney failure, unspecified: Secondary | ICD-10-CM | POA: Diagnosis not present

## 2019-09-30 DIAGNOSIS — S72001D Fracture of unspecified part of neck of right femur, subsequent encounter for closed fracture with routine healing: Secondary | ICD-10-CM | POA: Diagnosis not present

## 2019-09-30 DIAGNOSIS — I82409 Acute embolism and thrombosis of unspecified deep veins of unspecified lower extremity: Secondary | ICD-10-CM | POA: Diagnosis not present

## 2019-09-30 DIAGNOSIS — I4819 Other persistent atrial fibrillation: Secondary | ICD-10-CM | POA: Diagnosis not present

## 2019-09-30 DIAGNOSIS — I10 Essential (primary) hypertension: Secondary | ICD-10-CM | POA: Diagnosis not present

## 2019-09-30 DIAGNOSIS — I4891 Unspecified atrial fibrillation: Secondary | ICD-10-CM | POA: Diagnosis not present

## 2019-09-30 DIAGNOSIS — I639 Cerebral infarction, unspecified: Secondary | ICD-10-CM | POA: Diagnosis not present

## 2019-09-30 DIAGNOSIS — R110 Nausea: Secondary | ICD-10-CM | POA: Diagnosis not present

## 2019-09-30 DIAGNOSIS — K922 Gastrointestinal hemorrhage, unspecified: Secondary | ICD-10-CM | POA: Diagnosis not present

## 2019-09-30 DIAGNOSIS — E119 Type 2 diabetes mellitus without complications: Secondary | ICD-10-CM | POA: Diagnosis not present

## 2019-09-30 DIAGNOSIS — I5033 Acute on chronic diastolic (congestive) heart failure: Secondary | ICD-10-CM | POA: Diagnosis not present

## 2019-09-30 DIAGNOSIS — E039 Hypothyroidism, unspecified: Secondary | ICD-10-CM | POA: Diagnosis not present

## 2019-10-01 MED ORDER — MUPIROCIN 2 % EX OINT
TOPICAL_OINTMENT | CUTANEOUS | Status: DC
Start: 2019-09-30 — End: 2019-10-01

## 2019-10-01 MED ORDER — INSULIN LISPRO 100 UNIT/ML ~~LOC~~ SOLN
0.00 | SUBCUTANEOUS | Status: DC
Start: 2019-09-30 — End: 2019-10-01

## 2019-10-01 MED ORDER — INSULIN GLARGINE 100 UNIT/ML ~~LOC~~ SOLN
4.00 | SUBCUTANEOUS | Status: DC
Start: ? — End: 2019-10-01

## 2019-10-01 MED ORDER — GENERIC EXTERNAL MEDICATION
Status: DC
Start: ? — End: 2019-10-01

## 2019-10-04 DIAGNOSIS — 419620001 Death: Secondary | SNOMED CT | POA: Diagnosis not present

## 2019-10-04 DIAGNOSIS — I4819 Other persistent atrial fibrillation: Secondary | ICD-10-CM | POA: Diagnosis not present

## 2019-10-04 DIAGNOSIS — K922 Gastrointestinal hemorrhage, unspecified: Secondary | ICD-10-CM | POA: Diagnosis not present

## 2019-10-04 DIAGNOSIS — R110 Nausea: Secondary | ICD-10-CM | POA: Diagnosis not present

## 2019-10-04 DIAGNOSIS — E119 Type 2 diabetes mellitus without complications: Secondary | ICD-10-CM | POA: Diagnosis not present

## 2019-10-04 DEATH — deceased

## 2019-11-04 DEATH — deceased

## 2020-02-11 IMAGING — DX DG CHEST 1V
1 series · 1 of 1 positions shown · non-contrast
Comparison: 07/24/2019 chest radiograph.

CLINICAL DATA: Pneumonia

EXAM:
CHEST  1 VIEW

[chest ap]
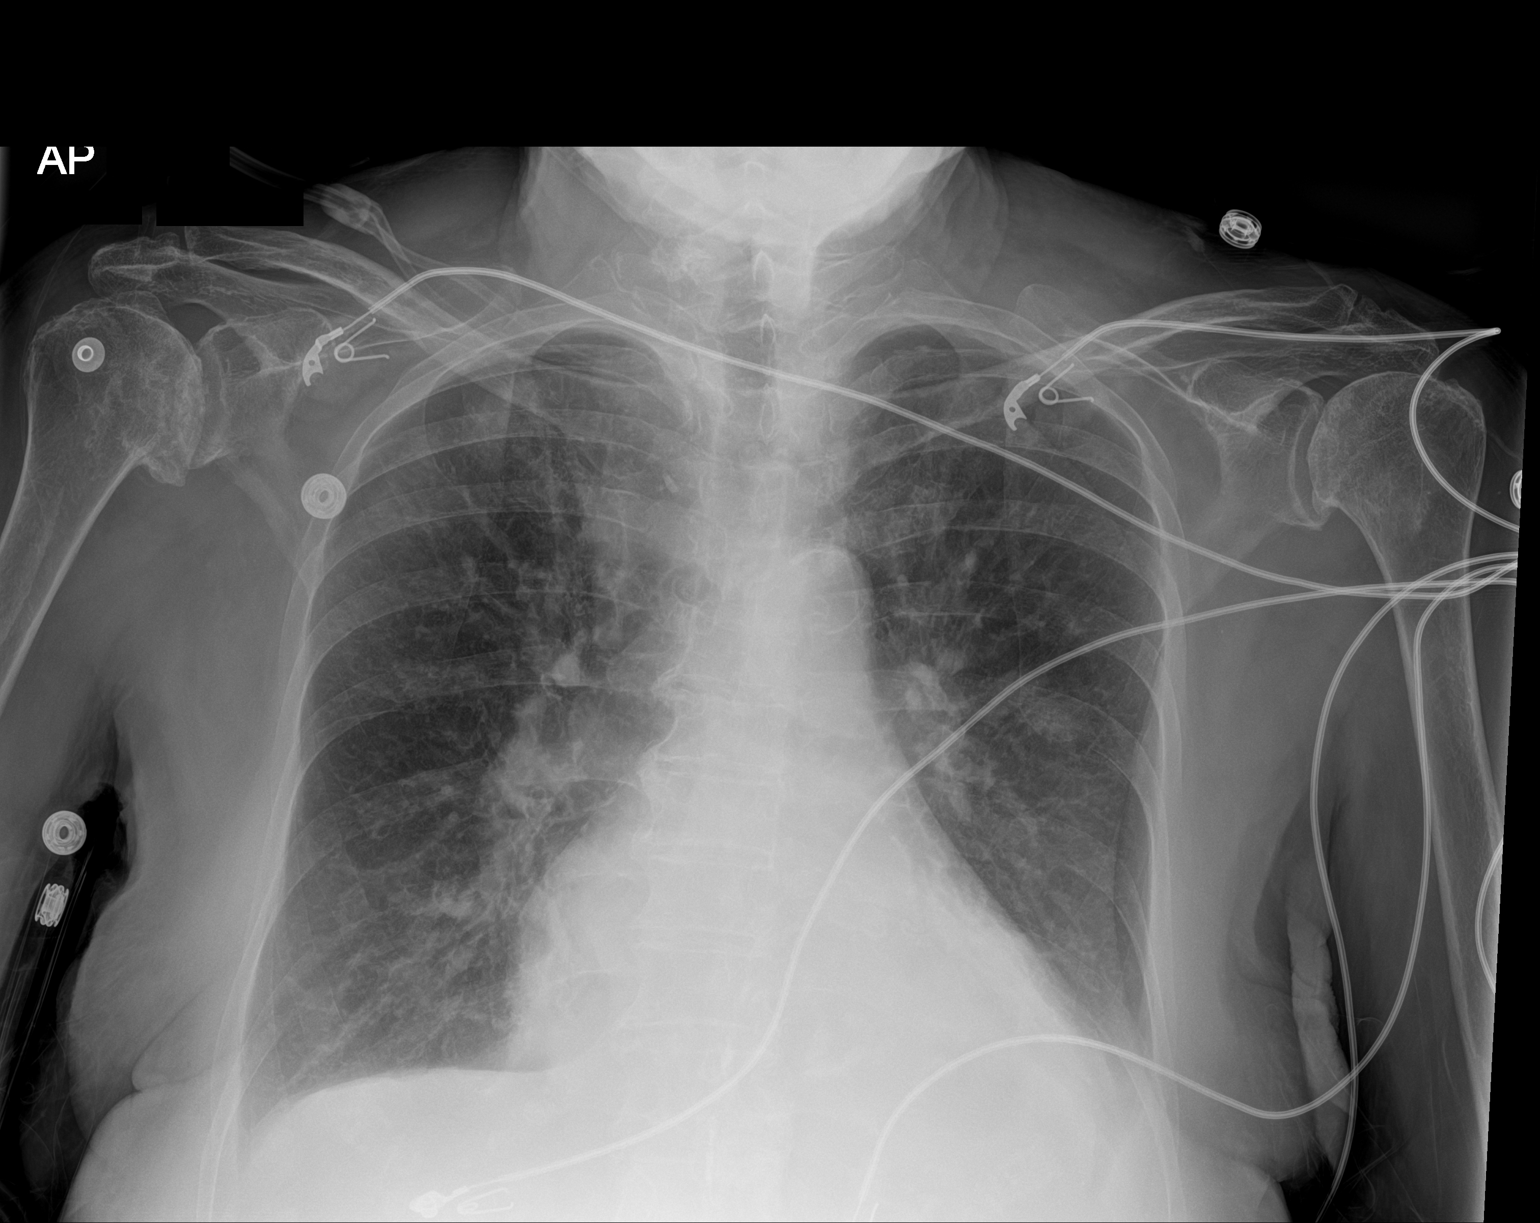

[1 of 1 positions shown; findings below may reference images not displayed]

FINDINGS: Stable cardiomediastinal silhouette with mild cardiomegaly. No
pneumothorax. Stable small bilateral pleural effusions, left greater
than right. Patchy apical right and parahilar left mid lung
opacities, mildly increased.
IMPRESSION: 1. Mildly increased patchy apical right in parahilar left mid lung
opacities, compatible with reported pneumonia versus mild pulmonary
edema.
2. Small stable bilateral pleural effusions, left greater than
right.
3. Mild cardiomegaly.

## 2020-02-13 IMAGING — US US RENAL
1 series · 14 of 25 positions shown · non-contrast
Comparison: Ultrasound 07/26/2019

CLINICAL DATA: Acute kidney injury

EXAM:
RENAL / URINARY TRACT ULTRASOUND COMPLETE

[Series 1: us renal · 42 acquisitions, 14 frames shown]
[im 1/42]
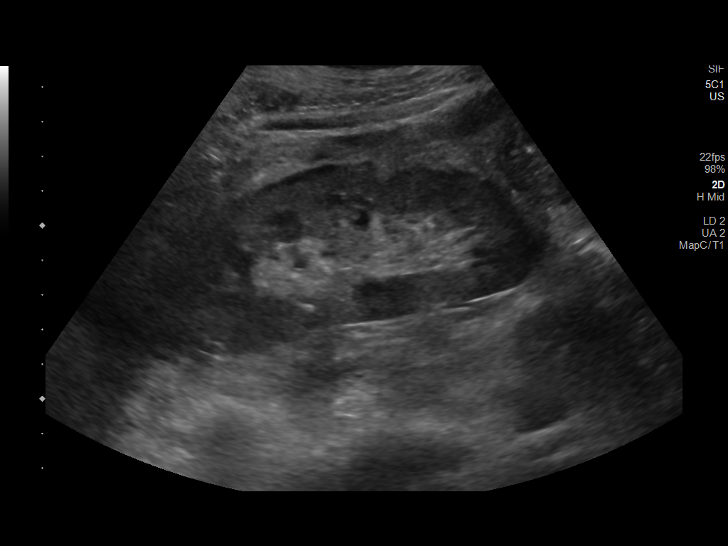
[im 4/42]
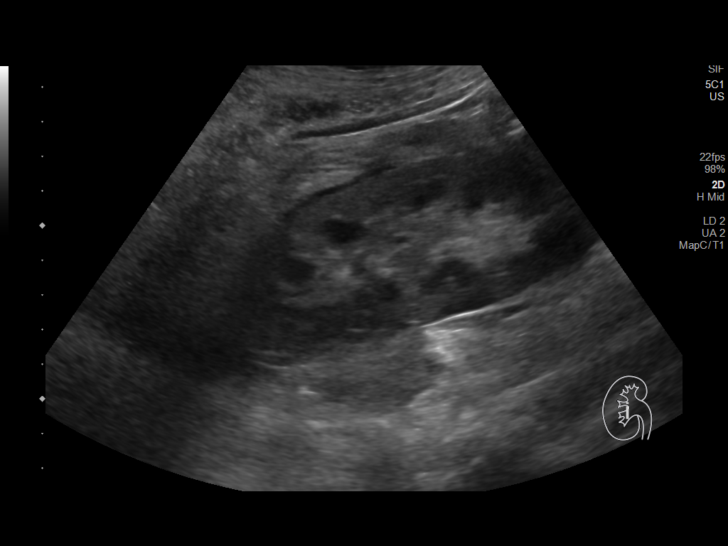
[im 7/42]
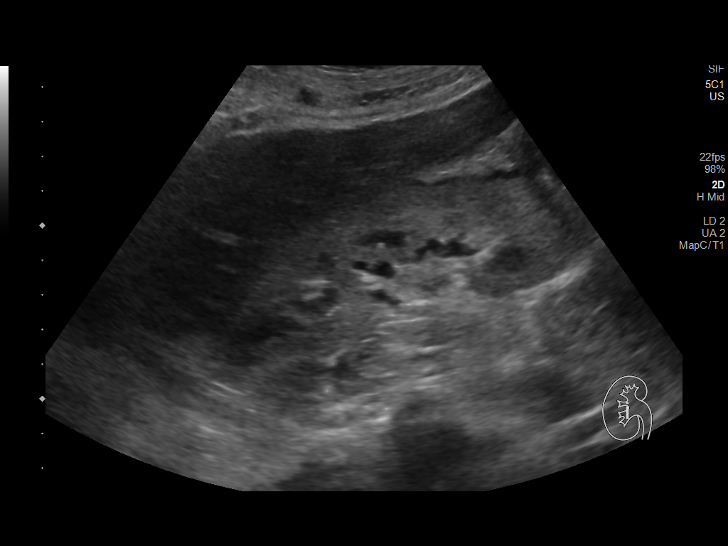
[im 11/42]
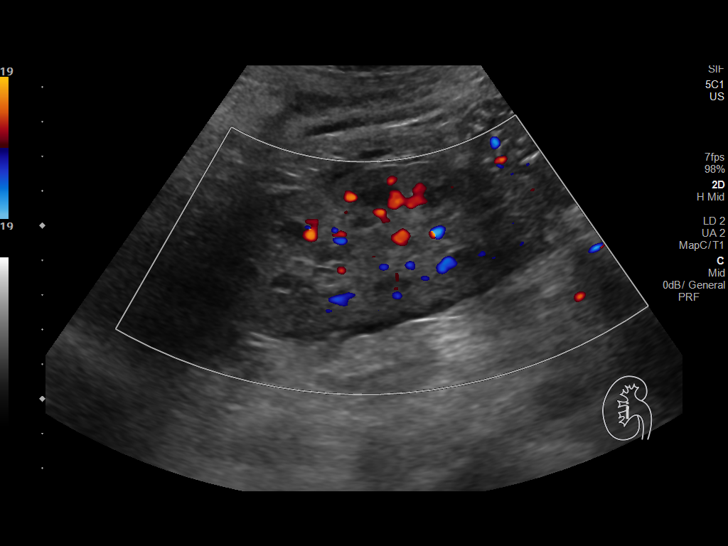
[im 14/42]
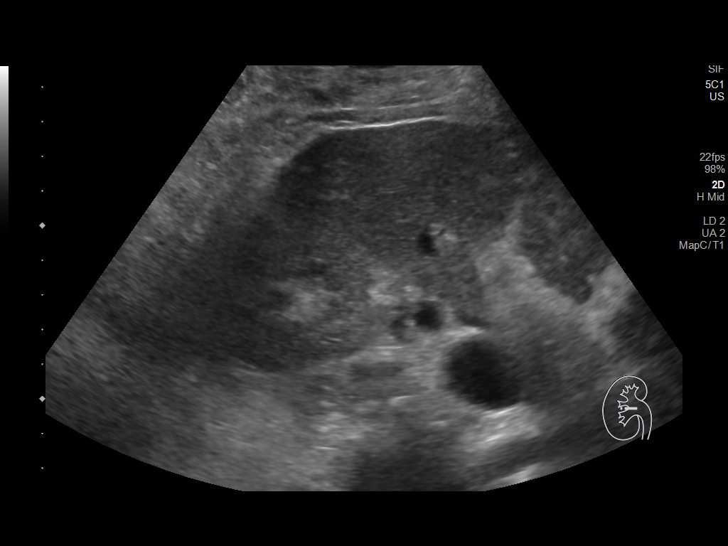
[im 16/42]
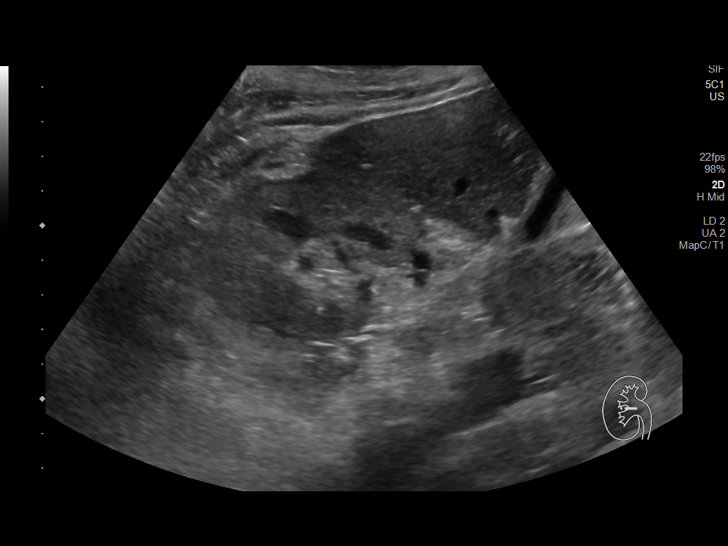
[im 19/42]
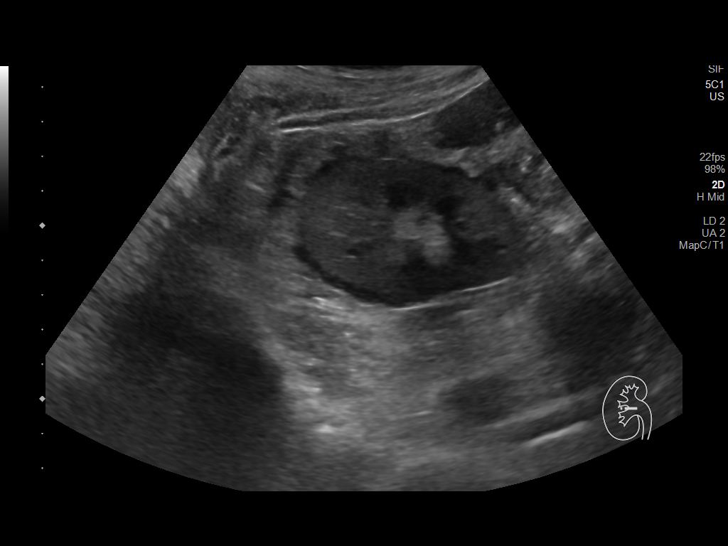
[im 23/42]
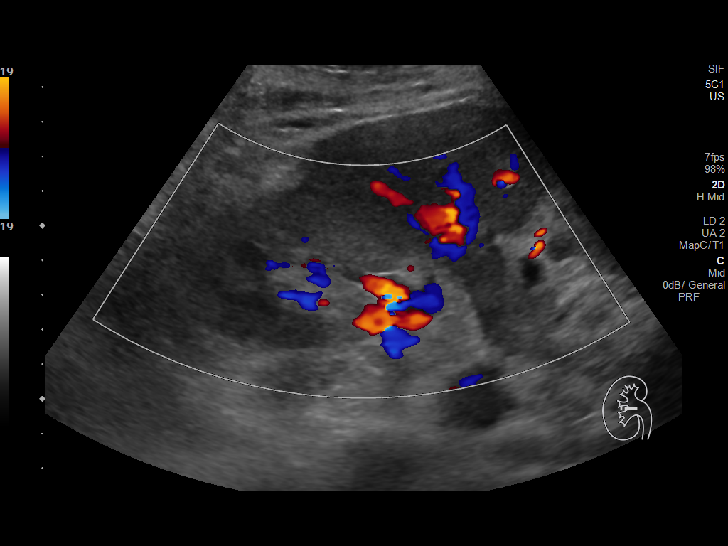
[im 26/42]
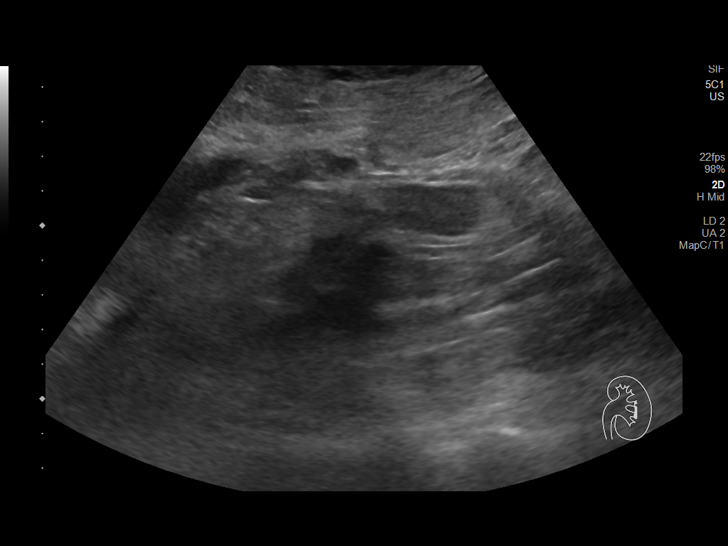
[im 28/42]
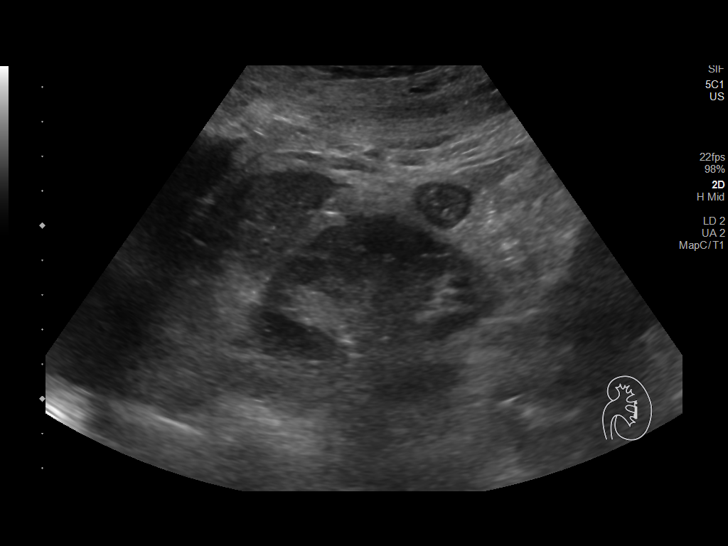
[im 31/42]
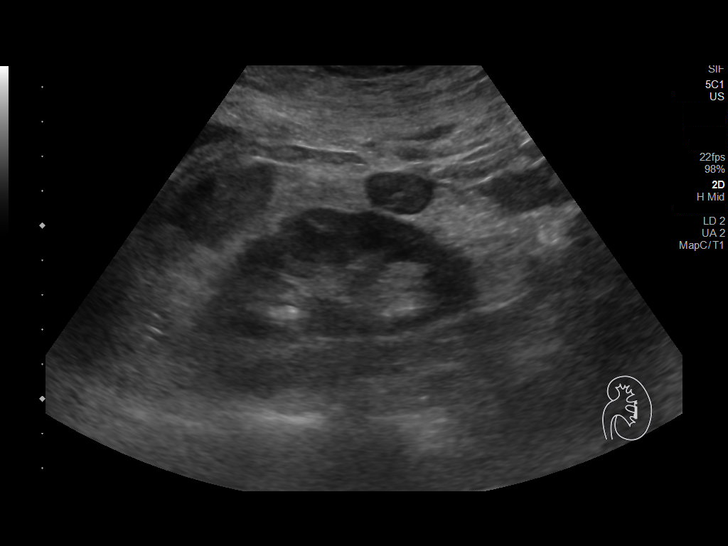
[im 35/42]
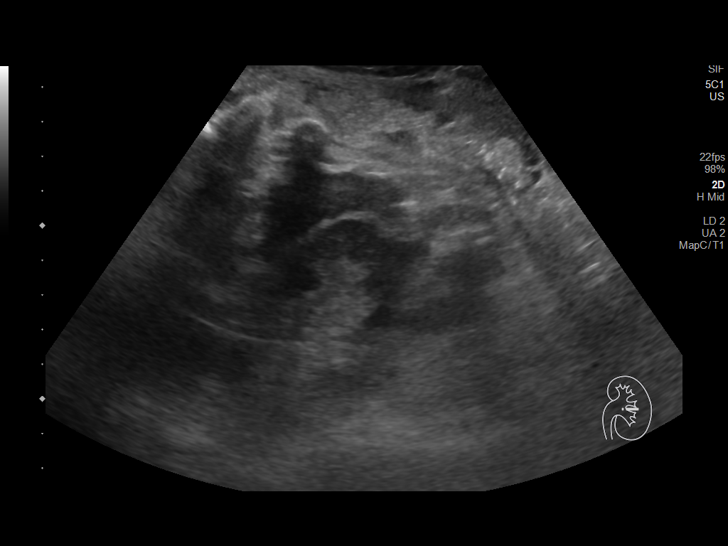
[im 38/42]
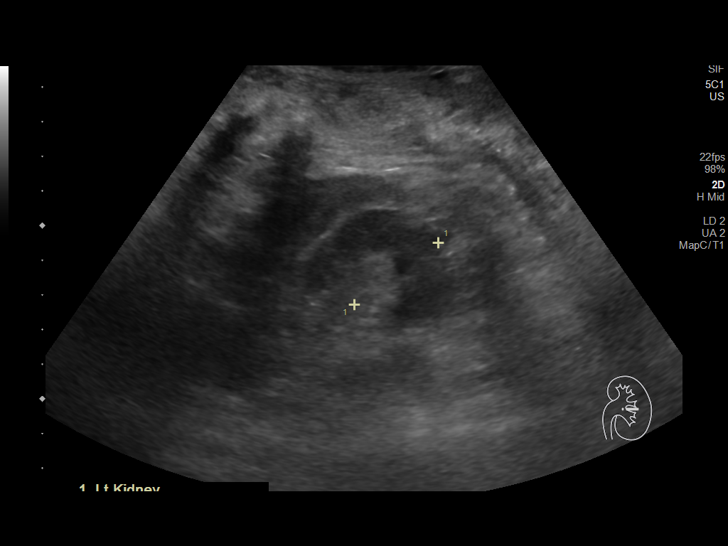
[im 42/42]
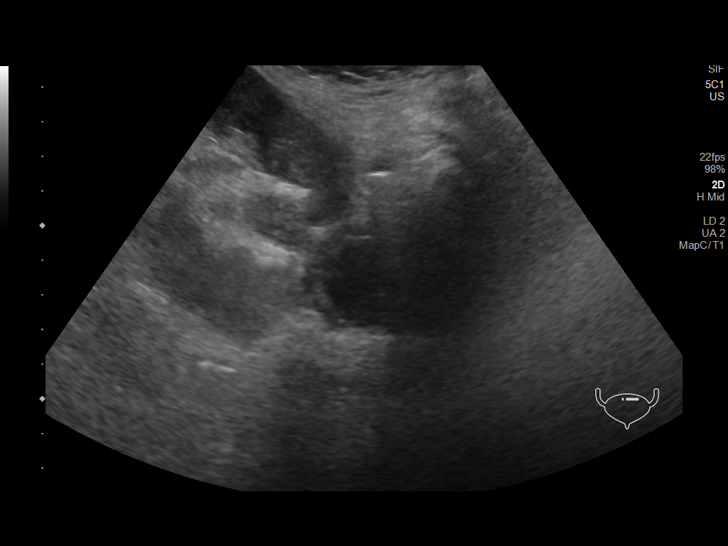

[14 of 25 positions shown; findings below may reference images not displayed]

FINDINGS: Right Kidney:

Renal measurements: 10.7 x 4.6 x 3.4 cm = volume: 89.3 mL. Diffusely
increased echogenicity with slight loss of the corticomedullary
differentiation. No visible renal calculus or hydronephrosis. No
worrisome right renal lesion.

Left Kidney:

Renal measurements: 7.6 x 3.8 x 3.0 cm = volume: 44.7 mL.
Echogenicity is within normal limits. No shadowing calculus,
hydronephrosis or concerning renal mass.

Bladder:

Bladder is decompressed by inflated Foley catheter and therefore
poorly assessed at the time of imaging.

Other:

None.
IMPRESSION: Increased right renal cortical echogenicity and loss of
corticomedullary differentiation compatible with medical renal
disease.

Left kidney is a more normal appearance. No other concerning renal
abnormality.

Bladder decompressed by inflated Foley catheter.
